# Patient Record
Sex: Female | Born: 1965 | Race: White | Hispanic: No | Marital: Married | Smoking: Never smoker
Health system: Southern US, Community
[De-identification: ages and names within clinical notes are randomized; demographics above are authoritative.]

## PROBLEM LIST (undated history)

## (undated) DIAGNOSIS — K760 Fatty (change of) liver, not elsewhere classified: Secondary | ICD-10-CM

## (undated) DIAGNOSIS — M549 Dorsalgia, unspecified: Secondary | ICD-10-CM

## (undated) DIAGNOSIS — R42 Dizziness and giddiness: Secondary | ICD-10-CM

## (undated) DIAGNOSIS — M2550 Pain in unspecified joint: Secondary | ICD-10-CM

## (undated) DIAGNOSIS — K219 Gastro-esophageal reflux disease without esophagitis: Secondary | ICD-10-CM

## (undated) DIAGNOSIS — G4700 Insomnia, unspecified: Secondary | ICD-10-CM

## (undated) DIAGNOSIS — K5792 Diverticulitis of intestine, part unspecified, without perforation or abscess without bleeding: Secondary | ICD-10-CM

## (undated) DIAGNOSIS — M1990 Unspecified osteoarthritis, unspecified site: Secondary | ICD-10-CM

## (undated) DIAGNOSIS — T7840XA Allergy, unspecified, initial encounter: Secondary | ICD-10-CM

## (undated) DIAGNOSIS — M47812 Spondylosis without myelopathy or radiculopathy, cervical region: Secondary | ICD-10-CM

## (undated) DIAGNOSIS — M069 Rheumatoid arthritis, unspecified: Secondary | ICD-10-CM

## (undated) DIAGNOSIS — D649 Anemia, unspecified: Secondary | ICD-10-CM

## (undated) HISTORY — DX: Dizziness and giddiness: R42

## (undated) HISTORY — PX: UPPER GI ENDOSCOPY: SHX6162

## (undated) HISTORY — DX: Gastro-esophageal reflux disease without esophagitis: K21.9

## (undated) HISTORY — DX: Unspecified osteoarthritis, unspecified site: M19.90

## (undated) HISTORY — DX: Fatty (change of) liver, not elsewhere classified: K76.0

## (undated) HISTORY — DX: Anemia, unspecified: D64.9

## (undated) HISTORY — DX: Diverticulitis of intestine, part unspecified, without perforation or abscess without bleeding: K57.92

## (undated) HISTORY — DX: Rheumatoid arthritis, unspecified: M06.9

## (undated) HISTORY — DX: Allergy, unspecified, initial encounter: T78.40XA

## (undated) HISTORY — DX: Dorsalgia, unspecified: M54.9

## (undated) HISTORY — DX: Pain in unspecified joint: M25.50

## (undated) HISTORY — PX: ANKLE SURGERY: SHX546

## (undated) HISTORY — PX: ABDOMINAL HYSTERECTOMY: SHX81

## (undated) HISTORY — DX: Spondylosis without myelopathy or radiculopathy, cervical region: M47.812

---

## 2013-07-17 ENCOUNTER — Other Ambulatory Visit (HOSPITAL_BASED_OUTPATIENT_CLINIC_OR_DEPARTMENT_OTHER): Payer: Self-pay | Admitting: Family Medicine

## 2013-07-17 DIAGNOSIS — Z1231 Encounter for screening mammogram for malignant neoplasm of breast: Secondary | ICD-10-CM

## 2015-07-07 DIAGNOSIS — M1712 Unilateral primary osteoarthritis, left knee: Secondary | ICD-10-CM | POA: Diagnosis not present

## 2015-07-07 DIAGNOSIS — M25562 Pain in left knee: Secondary | ICD-10-CM | POA: Diagnosis not present

## 2015-07-07 DIAGNOSIS — M25462 Effusion, left knee: Secondary | ICD-10-CM | POA: Diagnosis not present

## 2015-07-07 DIAGNOSIS — Z6838 Body mass index (BMI) 38.0-38.9, adult: Secondary | ICD-10-CM | POA: Diagnosis not present

## 2015-08-21 ENCOUNTER — Encounter: Payer: Self-pay | Admitting: Emergency Medicine

## 2015-08-21 ENCOUNTER — Emergency Department (INDEPENDENT_AMBULATORY_CARE_PROVIDER_SITE_OTHER)
Admission: EM | Admit: 2015-08-21 | Discharge: 2015-08-21 | Disposition: A | Discharge status: Home / Self Care | Payer: BLUE CROSS/BLUE SHIELD | Source: Home / Self Care | Attending: Family Medicine | Admitting: Family Medicine

## 2015-08-21 DIAGNOSIS — J040 Acute laryngitis: Secondary | ICD-10-CM | POA: Diagnosis not present

## 2015-08-21 DIAGNOSIS — J0100 Acute maxillary sinusitis, unspecified: Secondary | ICD-10-CM | POA: Diagnosis not present

## 2015-08-21 DIAGNOSIS — J209 Acute bronchitis, unspecified: Secondary | ICD-10-CM | POA: Diagnosis not present

## 2015-08-21 HISTORY — DX: Insomnia, unspecified: G47.00

## 2015-08-21 MED ORDER — DOXYCYCLINE HYCLATE 100 MG PO CAPS: 100.0000 mg | ORAL_CAPSULE | Freq: Two times a day (BID) | ORAL | Status: DC

## 2015-08-21 MED ORDER — PREDNISONE 20 MG PO TABS: ORAL_TABLET | ORAL | Status: DC

## 2015-08-21 MED ORDER — BENZONATATE 100 MG PO CAPS: 100.0000 mg | ORAL_CAPSULE | Freq: Three times a day (TID) | ORAL | Status: DC

## 2015-08-21 NOTE — ED Notes (Signed)
Patient reports onset of congestion about 2 weeks ago; then went on airplane trip and 4 days ago congestion, cough and ear pressure became more intense.

## 2015-08-21 NOTE — Discharge Instructions (Signed)
You may take 400-600mg  Ibuprofen (Motrin) every 6-8 hours for fever and pain  Alternate with Tylenol  You may take 500mg  Tylenol every 4-6 hours as needed for fever and pain  Follow-up with your primary care provider next week for recheck of symptoms if not improving.  Be sure to drink plenty of fluids and rest, at least 8hrs of sleep a night, preferably more while you are sick. Return urgent care or go to closest ER if you cannot keep down fluids/signs of dehydration, fever not reducing with Tylenol, difficulty breathing/wheezing, stiff neck, worsening condition, or other concerns (see below)  Please take antibiotics as prescribed and be sure to complete entire course even if you start to feel better to ensure infection does not come back.   Acute Bronchitis Bronchitis is when the airways that extend from the windpipe into the lungs get red, puffy, and painful (inflamed). Bronchitis often causes thick spit (mucus) to develop. This leads to a cough. A cough is the most common symptom of bronchitis. In acute bronchitis, the condition usually begins suddenly and goes away over time (usually in 2 weeks). Smoking, allergies, and asthma can make bronchitis worse. Repeated episodes of bronchitis may cause more lung problems. HOME CARE  Rest.  Drink enough fluids to keep your pee (urine) clear or pale yellow (unless you need to limit fluids as told by your doctor).  Only take over-the-counter or prescription medicines as told by your doctor.  Avoid smoking and secondhand smoke. These can make bronchitis worse. If you are a smoker, think about using nicotine gum or skin patches. Quitting smoking will help your lungs heal faster.  Reduce the chance of getting bronchitis again by:  Washing your hands often.  Avoiding people with cold symptoms.  Trying not to touch your hands to your mouth, nose, or eyes.  Follow up with your doctor as told. GET HELP IF: Your symptoms do not improve after 1 week  of treatment. Symptoms include:  Cough.  Fever.  Coughing up thick spit.  Body aches.  Chest congestion.  Chills.  Shortness of breath.  Sore throat. GET HELP RIGHT AWAY IF:   You have an increased fever.  You have chills.  You have severe shortness of breath.  You have bloody thick spit (sputum).  You throw up (vomit) often.  You lose too much body fluid (dehydration).  You have a severe headache.  You faint. MAKE SURE YOU:   Understand these instructions.  Will watch your condition.  Will get help right away if you are not doing well or get worse.   This information is not intended to replace advice given to you by your health care provider. Make sure you discuss any questions you have with your health care provider.   Document Released: 08/04/2007 Document Revised: 10/18/2012 Document Reviewed: 08/08/2012 Elsevier Interactive Patient Education 2016 [employer]  Laryngitis Laryngitis is swelling (inflammation) of your vocal cords. This causes hoarseness, coughing, loss of voice, sore throat, or a dry throat. When your vocal cords are inflamed, your voice sounds different. Laryngitis can be temporary (acute) or long-term (chronic). Most cases of acute laryngitis improve with time. Chronic laryngitis is laryngitis that lasts for more than three weeks. HOME CARE  Drink enough fluid to keep your pee (urine) clear or pale yellow.  Breathe in moist air. Use a humidifier if you live in a dry climate.  Take medicines only as told by your doctor.  Do not smoke cigarettes or electronic cigarettes. If  you need help quitting, ask your doctor.  Talk as little as possible. Also avoid whispering, which can cause vocal strain.  Write instead of talking. Do this until your voice is back to normal. GET HELP IF:  You have a fever.  Your pain is worse.  You have trouble swallowing. GET HELP RIGHT AWAY IF:  You cough up blood.  You have trouble breathing.     This information is not intended to replace advice given to you by your health care provider. Make sure you discuss any questions you have with your health care provider.   Document Released: 02/04/2011 Document Revised: 03/08/2014 Document Reviewed: 07/31/2013 Elsevier Interactive Patient Education [employer].

## 2015-08-21 NOTE — ED Provider Notes (Signed)
CSN: [id number]     Arrival date & time 08/21/15  1818 History   First MD Initiated Contact with Patient 08/21/15 1827     Chief Complaint  Patient presents with  . Nasal Congestion  . Cough  . Otalgia   (Consider location/radiation/quality/duration/timing/severity/associated sxs/prior Treatment) HPI Brittany Malone is a 50 y.o. female presenting to UC with c/o intermittent sinus congestion for about 2 weeks but was managing it with her Flonase.  She then went on an airplane about 4 days ago, symptoms have worsened ever since. She now has a moderately productive cough, hoarse voice, bilateral ear pressure and sinus pain.  Denies fever, chills, n/v/d. She has had prednisone in the past as well as has albuterol at home for when she gets sick but no official dx of asthma or COPD.  Pt reports anaphylaxis with PCN. She has had doxycycline in the past and has done well.   Past Medical History  Diagnosis Date  . Insomnia    Past Surgical History  Procedure Laterality Date  . Abdominal hysterectomy    . Ankle surgery     History reviewed. No pertinent family history. Social History  Substance Use Topics  . Smoking status: Never Smoker   . Smokeless tobacco: None  . Alcohol Use: No   OB History    No data available     Review of Systems  Constitutional: Negative for fever and chills.  HENT: Positive for congestion, ear pain, rhinorrhea, sinus pressure, sore throat and voice change. Negative for trouble swallowing.   Respiratory: Positive for cough. Negative for shortness of breath.   Cardiovascular: Negative for chest pain and palpitations.  Gastrointestinal: Negative for nausea, vomiting, abdominal pain and diarrhea.  Musculoskeletal: Negative for myalgias, back pain and arthralgias.  Skin: Negative for rash.  Neurological: Positive for headaches. Negative for dizziness and light-headedness.    Allergies  Penicillins  Home Medications   Prior to Admission medications    Medication Sig Start Date End Date Taking? Authorizing Provider  amitriptyline (ELAVIL) 25 MG tablet Take 25 mg by mouth at bedtime.   Yes Historical Provider, MD  budesonide-formoterol (SYMBICORT) 160-4.5 MCG/ACT inhaler Inhale 2 puffs into the lungs 2 (two) times daily.   Yes Historical Provider, MD  benzonatate (TESSALON) 100 MG capsule Take 1-2 capsules (100-200 mg total) by mouth every 8 (eight) hours. 08/21/15   Noland Fordyce, PA-C  doxycycline (VIBRAMYCIN) 100 MG capsule Take 1 capsule (100 mg total) by mouth 2 (two) times daily. One po bid x 7 days 08/21/15   Noland Fordyce, PA-C  predniSONE (DELTASONE) 20 MG tablet 3 tabs po day one, then 2 po daily x 4 days 08/21/15   Noland Fordyce, PA-C   Meds Ordered and Administered this Visit  Medications - No data to display  BP 115/84 mmHg  Pulse 85  Temp(Src) 98.1 F (36.7 C) (Oral)  Resp 16  Ht 5\' 8"  (1.727 m)  Wt 258 lb (117.028 kg)  BMI 39.24 kg/m2  SpO2 95% No data found.   Physical Exam  Constitutional: She appears well-developed and well-nourished. No distress.  HENT:  Head: Normocephalic and atraumatic.  Right Ear: Tympanic membrane normal.  Left Ear: Tympanic membrane normal.  Nose: Mucosal edema present. Right sinus exhibits maxillary sinus tenderness. Right sinus exhibits no frontal sinus tenderness. Left sinus exhibits maxillary sinus tenderness. Left sinus exhibits no frontal sinus tenderness.  Mouth/Throat: Uvula is midline, oropharynx is clear and moist and mucous membranes are normal.  Eyes: Conjunctivae are  normal. No scleral icterus.  Neck: Normal range of motion. Neck supple.  Hoarse voice, no stridor.  Cardiovascular: Normal rate, regular rhythm and normal heart sounds.   Pulmonary/Chest: Effort normal and breath sounds normal. No stridor. No respiratory distress. She has no wheezes. She has no rales.  Abdominal: Soft. She exhibits no distension. There is no tenderness.  Musculoskeletal: Normal range of motion.   Lymphadenopathy:    She has no cervical adenopathy.  Neurological: She is alert.  Skin: Skin is warm and dry. She is not diaphoretic.  Nursing note and vitals reviewed.   ED Course  Procedures (including critical care time)  Labs Review Labs Reviewed - No data to display  Imaging Review No results found.   MDM   1. Acute maxillary sinusitis, recurrence not specified   2. Acute bronchitis, unspecified organism   3. Laryngitis    Pt c/o worsening sinus congestion and pain, cough, and hoarse voice. Exam c/w acute bronchitis and sinusitis with laryngitis.   Rx: prednisone, doxycycline, and tessalon   Advised pt to use acetaminophen and ibuprofen as needed for fever and pain. Encouraged rest and fluids. F/u with PCP in 1 week if not improving, sooner if worsening. Pt verbalized understanding and agreement with tx plan.   Noland Fordyce, PA-C 08/21/15 [phone]

## 2015-08-28 ENCOUNTER — Telehealth: Payer: Self-pay | Admitting: *Deleted

## 2015-08-28 MED ORDER — DOXYCYCLINE HYCLATE 100 MG PO TABS: 100.0000 mg | ORAL_TABLET | Freq: Two times a day (BID) | ORAL | Status: DC

## 2015-08-28 NOTE — ED Notes (Signed)
Pt was treated for bronchitis and sinus infection by Junie Panning. She has 1 dose of doxy left and has finished the prednisone. She was feeling better up until yesterday, became more congested, cough up green sputum and more fatigue. She is inquiring about additional antibiotic. Please advise.   Dr. Assunta Found e-scribed additional doxy to [facility] on [address]. Patient notified.

## 2015-09-01 DIAGNOSIS — J9801 Acute bronchospasm: Secondary | ICD-10-CM | POA: Diagnosis not present

## 2015-09-01 DIAGNOSIS — J069 Acute upper respiratory infection, unspecified: Secondary | ICD-10-CM | POA: Diagnosis not present

## 2015-09-16 DIAGNOSIS — K5790 Diverticulosis of intestine, part unspecified, without perforation or abscess without bleeding: Secondary | ICD-10-CM | POA: Diagnosis not present

## 2015-09-16 DIAGNOSIS — G479 Sleep disorder, unspecified: Secondary | ICD-10-CM | POA: Diagnosis not present

## 2015-09-16 DIAGNOSIS — Z1239 Encounter for other screening for malignant neoplasm of breast: Secondary | ICD-10-CM | POA: Diagnosis not present

## 2015-09-16 DIAGNOSIS — R5383 Other fatigue: Secondary | ICD-10-CM | POA: Diagnosis not present

## 2015-09-16 DIAGNOSIS — M2550 Pain in unspecified joint: Secondary | ICD-10-CM | POA: Diagnosis not present

## 2015-09-16 DIAGNOSIS — Z0000 Encounter for general adult medical examination without abnormal findings: Secondary | ICD-10-CM | POA: Diagnosis not present

## 2015-10-20 DIAGNOSIS — M62838 Other muscle spasm: Secondary | ICD-10-CM | POA: Diagnosis not present

## 2016-03-09 DIAGNOSIS — Z713 Dietary counseling and surveillance: Secondary | ICD-10-CM | POA: Diagnosis not present

## 2016-09-17 DIAGNOSIS — K219 Gastro-esophageal reflux disease without esophagitis: Secondary | ICD-10-CM | POA: Diagnosis not present

## 2016-09-17 DIAGNOSIS — Z0000 Encounter for general adult medical examination without abnormal findings: Secondary | ICD-10-CM | POA: Diagnosis not present

## 2016-09-17 DIAGNOSIS — J309 Allergic rhinitis, unspecified: Secondary | ICD-10-CM | POA: Diagnosis not present

## 2016-09-17 DIAGNOSIS — K5790 Diverticulosis of intestine, part unspecified, without perforation or abscess without bleeding: Secondary | ICD-10-CM | POA: Diagnosis not present

## 2016-09-17 DIAGNOSIS — Z01419 Encounter for gynecological examination (general) (routine) without abnormal findings: Secondary | ICD-10-CM | POA: Diagnosis not present

## 2016-09-17 DIAGNOSIS — I10 Essential (primary) hypertension: Secondary | ICD-10-CM | POA: Diagnosis not present

## 2016-09-17 DIAGNOSIS — Z1231 Encounter for screening mammogram for malignant neoplasm of breast: Secondary | ICD-10-CM | POA: Diagnosis not present

## 2016-09-17 DIAGNOSIS — R768 Other specified abnormal immunological findings in serum: Secondary | ICD-10-CM | POA: Diagnosis not present

## 2016-09-28 DIAGNOSIS — Z1231 Encounter for screening mammogram for malignant neoplasm of breast: Secondary | ICD-10-CM | POA: Diagnosis not present

## 2016-09-28 DIAGNOSIS — Z139 Encounter for screening, unspecified: Secondary | ICD-10-CM | POA: Diagnosis not present

## 2017-01-30 ENCOUNTER — Other Ambulatory Visit: Payer: Self-pay

## 2017-01-30 ENCOUNTER — Emergency Department (INDEPENDENT_AMBULATORY_CARE_PROVIDER_SITE_OTHER)
Admission: EM | Admit: 2017-01-30 | Discharge: 2017-01-30 | Disposition: A | Discharge status: Home / Self Care | Payer: BLUE CROSS/BLUE SHIELD | Source: Home / Self Care | Attending: Family Medicine | Admitting: Family Medicine

## 2017-01-30 ENCOUNTER — Encounter: Payer: Self-pay | Admitting: Emergency Medicine

## 2017-01-30 DIAGNOSIS — J209 Acute bronchitis, unspecified: Secondary | ICD-10-CM

## 2017-01-30 DIAGNOSIS — J0100 Acute maxillary sinusitis, unspecified: Secondary | ICD-10-CM

## 2017-01-30 MED ORDER — ALBUTEROL SULFATE HFA 108 (90 BASE) MCG/ACT IN AERS: 1.0000 | INHALATION_SPRAY | Freq: Four times a day (QID) | RESPIRATORY_TRACT | 0 refills | Status: DC | PRN

## 2017-01-30 MED ORDER — BENZONATATE 100 MG PO CAPS: 100.0000 mg | ORAL_CAPSULE | Freq: Three times a day (TID) | ORAL | 0 refills | Status: DC

## 2017-01-30 MED ORDER — AZITHROMYCIN 250 MG PO TABS: 250.0000 mg | ORAL_TABLET | Freq: Every day | ORAL | 0 refills | Status: DC

## 2017-01-30 MED ORDER — PREDNISONE 20 MG PO TABS
ORAL_TABLET | ORAL | 0 refills | Status: DC
Start: 2017-01-30 — End: 2018-07-20

## 2017-01-30 NOTE — ED Triage Notes (Signed)
Has had congestion, cough and now rib pain for one week; just returned from [place] and shared cabin with daughter who had same symptoms. Took Ibuprofen 600mg  2 hours ago.

## 2017-01-30 NOTE — ED Provider Notes (Signed)
[facility]    CSN: [id number] Arrival date & time: 01/30/17  1708     History   Chief Complaint Chief Complaint  Patient presents with  . Cough  . Nasal Congestion    HPI Brittany Malone is a 51 y.o. female.   HPI  Brittany Malone is a 51 y.o. female presenting to UC with c/o 1 week of worsening productive cough that has developed into Left sided chest/rib and back pain from the cough.  She has been taking mucinex and ibuprofen with mild relief associated sinus pain and congestion.  She recently returned from a 1 week long [place] cruise and shared a cabin with her daughter who was seen earlier today at this UC for similar symptoms that had been ongoing for 3 weeks.  Pt reports hx of bronchitis in the past.  Denies fever, chills, n/v/d.    Past Medical History:  Diagnosis Date  . Insomnia     There are no active problems to display for this patient.   Past Surgical History:  Procedure Laterality Date  . ABDOMINAL HYSTERECTOMY    . ANKLE SURGERY      OB History    No data available       Home Medications    Prior to Admission medications   Medication Sig Start Date End Date Taking? Authorizing Provider  albuterol (PROVENTIL HFA;VENTOLIN HFA) 108 (90 Base) MCG/ACT inhaler Inhale 1-2 puffs into the lungs every 6 (six) hours as needed for wheezing or shortness of breath. 01/30/17   Noe Gens, PA-C  amitriptyline (ELAVIL) 25 MG tablet Take 25 mg by mouth at bedtime.    [provider]  azithromycin (ZITHROMAX) 250 MG tablet Take 1 tablet (250 mg total) by mouth daily. Take first 2 tablets together, then 1 every day until finished. 01/30/17   Noe Gens, PA-C  benzonatate (TESSALON) 100 MG capsule Take 1-2 capsules (100-200 mg total) by mouth every 8 (eight) hours. 01/30/17   Noe Gens, PA-C  budesonide-formoterol (SYMBICORT) 160-4.5 MCG/ACT inhaler Inhale 2 puffs into the lungs 2 (two) times daily.    [provider]    doxycycline (VIBRA-TABS) 100 MG tablet Take 1 tablet (100 mg total) by mouth 2 (two) times daily. Take with food. 08/28/15   Kandra Nicolas, MD  doxycycline (VIBRAMYCIN) 100 MG capsule Take 1 capsule (100 mg total) by mouth 2 (two) times daily. One po bid x 7 days 08/21/15   Noe Gens, PA-C  predniSONE (DELTASONE) 20 MG tablet 3 tabs po day one, then 2 po daily x 4 days 01/30/17   Tyrell Antonio    Family History History reviewed. No pertinent family history.  Social History Social History   Tobacco Use  . Smoking status: Never Smoker  . Smokeless tobacco: Never Used  Substance Use Topics  . Alcohol use: No  . Drug use: Not on file     Allergies   Penicillins   Review of Systems Review of Systems  Constitutional: Negative for chills and fever.  HENT: Positive for congestion, postnasal drip and rhinorrhea. Negative for ear pain, sore throat, trouble swallowing and voice change.   Respiratory: Positive for cough, chest tightness and wheezing. Negative for shortness of breath.   Cardiovascular: Positive for chest pain (Left side/ribs with cough). Negative for palpitations.  Gastrointestinal: Negative for abdominal pain, diarrhea, nausea and vomiting.  Musculoskeletal: Positive for back pain and myalgias. Negative for arthralgias.  Skin: Negative for rash.  Physical Exam Triage Vital Signs ED Triage Vitals [01/30/17 1721]  Enc Vitals Group     BP 118/77     Pulse Rate 77     Resp 16     Temp 98.3 F (36.8 C)     Temp Source Oral     SpO2 96 %     Weight 230 lb (104.3 kg)     Height 5\' 8"  (1.727 m)     Head Circumference      Peak Flow      Pain Score      Pain Loc      Pain Edu?      Excl. in [place]?    No data found.  Updated Vital Signs BP 118/77 (BP Location: Right Arm)   Pulse 77   Temp 98.3 F (36.8 C) (Oral)   Resp 16   Ht 5\' 8"  (1.727 m)   Wt 230 lb (104.3 kg)   SpO2 96%   BMI 34.97 kg/m   Visual Acuity Right Eye Distance:   Left  Eye Distance:   Bilateral Distance:    Right Eye Near:   Left Eye Near:    Bilateral Near:     Physical Exam  Constitutional: She is oriented to person, place, and time. She appears well-developed and well-nourished. No distress.  HENT:  Head: Normocephalic and atraumatic.  Right Ear: Tympanic membrane normal.  Left Ear: Tympanic membrane normal.  Nose: Mucosal edema present. Right sinus exhibits maxillary sinus tenderness. Right sinus exhibits no frontal sinus tenderness. Left sinus exhibits maxillary sinus tenderness. Left sinus exhibits no frontal sinus tenderness.  Mouth/Throat: Uvula is midline, oropharynx is clear and moist and mucous membranes are normal.  Eyes: EOM are normal.  Neck: Normal range of motion.  Cardiovascular: Normal rate.  Pulmonary/Chest: Effort normal. She has wheezes. She has rhonchi. She has no rales.     She exhibits tenderness.    Musculoskeletal: Normal range of motion.  Neurological: She is alert and oriented to person, place, and time.  Skin: Skin is warm and dry. She is not diaphoretic.  Psychiatric: She has a normal mood and affect. Her behavior is normal.  Nursing note and vitals reviewed.    UC Treatments / Results  Labs (all labs ordered are listed, but only abnormal results are displayed) Labs Reviewed - No data to display  EKG  EKG Interpretation None       Radiology No results found.  Procedures Procedures (including critical care time)  Medications Ordered in UC Medications - No data to display   Initial Impression / Assessment and Plan / UC Course  I have reviewed the triage vital signs and the nursing notes.  Pertinent labs & imaging results that were available during my care of the patient were reviewed by me and considered in my medical decision making (see chart for details).     Hx and exam c/w bronchitis and maxillary sinusitis Will tx with Azithromycin as pt reports anaphylaxis with PCN Encouraged f/u  with PCP in 1 week if not improving.   Final Clinical Impressions(s) / UC Diagnoses   Final diagnoses:  Acute bronchitis, unspecified organism  Acute non-recurrent maxillary sinusitis    ED Discharge Orders        Ordered    benzonatate (TESSALON) 100 MG capsule  Every 8 hours     01/30/17 1741    azithromycin (ZITHROMAX) 250 MG tablet  Daily     01/30/17 1741    predniSONE (DELTASONE)  20 MG tablet     01/30/17 1741    albuterol (PROVENTIL HFA;VENTOLIN HFA) 108 (90 Base) MCG/ACT inhaler  Every 6 hours PRN     01/30/17 1741       Controlled Substance Prescriptions [place] Controlled Substance Registry consulted? Not Applicable   Tyrell Antonio 01/30/17 1810

## 2017-03-22 DIAGNOSIS — M7581 Other shoulder lesions, right shoulder: Secondary | ICD-10-CM | POA: Diagnosis not present

## 2017-09-21 DIAGNOSIS — Z0000 Encounter for general adult medical examination without abnormal findings: Secondary | ICD-10-CM | POA: Diagnosis not present

## 2017-09-21 DIAGNOSIS — M7662 Achilles tendinitis, left leg: Secondary | ICD-10-CM | POA: Diagnosis not present

## 2017-09-21 DIAGNOSIS — N951 Menopausal and female climacteric states: Secondary | ICD-10-CM | POA: Diagnosis not present

## 2017-09-21 DIAGNOSIS — Z1231 Encounter for screening mammogram for malignant neoplasm of breast: Secondary | ICD-10-CM | POA: Diagnosis not present

## 2017-09-21 DIAGNOSIS — R768 Other specified abnormal immunological findings in serum: Secondary | ICD-10-CM | POA: Diagnosis not present

## 2017-09-21 DIAGNOSIS — K219 Gastro-esophageal reflux disease without esophagitis: Secondary | ICD-10-CM | POA: Diagnosis not present

## 2017-11-02 DIAGNOSIS — Z1231 Encounter for screening mammogram for malignant neoplasm of breast: Secondary | ICD-10-CM | POA: Diagnosis not present

## 2017-11-10 DIAGNOSIS — J9801 Acute bronchospasm: Secondary | ICD-10-CM | POA: Diagnosis not present

## 2017-11-10 DIAGNOSIS — J069 Acute upper respiratory infection, unspecified: Secondary | ICD-10-CM | POA: Diagnosis not present

## 2017-12-07 DIAGNOSIS — M7732 Calcaneal spur, left foot: Secondary | ICD-10-CM | POA: Diagnosis not present

## 2017-12-07 DIAGNOSIS — M7752 Other enthesopathy of left foot: Secondary | ICD-10-CM | POA: Diagnosis not present

## 2017-12-07 DIAGNOSIS — M79672 Pain in left foot: Secondary | ICD-10-CM | POA: Diagnosis not present

## 2018-05-09 DIAGNOSIS — M7662 Achilles tendinitis, left leg: Secondary | ICD-10-CM | POA: Diagnosis not present

## 2018-05-09 DIAGNOSIS — M7752 Other enthesopathy of left foot: Secondary | ICD-10-CM | POA: Diagnosis not present

## 2018-05-09 DIAGNOSIS — M7741 Metatarsalgia, right foot: Secondary | ICD-10-CM | POA: Diagnosis not present

## 2018-07-14 NOTE — Progress Notes (Signed)
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  Subjective:  Pain in multiple joints and positive rheumatoid factor.   History of Present Illness: Brittany Malone is a 53 y.o. female seen in consultation per request of her PCP.  According to patient her symptoms started few years ago with aches and pains.  She states she had rheumatoid factor done by PCP which was about 18.  Repeat rheumatoid factor last year was 24.  She states the pain has gotten worse in the last 5 years.  She describes pain in her lower back in her SI joints left trochanteric area and in her hands.  She states she was also having discomfort in her right foot for which she was seen by podiatrist and the x-rays were normal.  She was given some orthotics.  She had surgery on her left foot in the past for a tendinopathy.  She has been experiencing pain in her elbow joints and TMJs.  She also has frequent headaches due to stress and sinuses.  She gives history of fatigue.  She does exercise on regular basis which is been helpful.  She states while she was in fourth grade she developed loss of central vision.  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  In her 39s she had recurrent problems with her right eye uveitis for couple of years and then the symptoms resolved.  She states she still sees retinal specialist on a regular basis.  She has had no recurrence of uveitis in the last 25 years.  Activities of Daily Living:  Patient reports morning stiffness for 1 hour.   Patient Reports nocturnal pain.  Difficulty dressing/grooming: Denies Difficulty climbing stairs: Denies Difficulty getting out of chair: Denies Difficulty using hands for taps, buttons, cutlery, and/or writing: Denies  Review of Systems  Constitutional:  Positive for fatigue. Negative for night sweats, weight gain and weight loss.  HENT: Negative for mouth sores, trouble swallowing, trouble swallowing, mouth dryness and nose dryness.   Eyes: Negative for pain, redness, visual disturbance and dryness.  Respiratory: Negative for cough, shortness of breath and difficulty breathing.   Cardiovascular: Negative for chest pain, palpitations, hypertension, irregular heartbeat and swelling in legs/feet.  Gastrointestinal: Negative for blood in stool, constipation and diarrhea.  Endocrine: Negative for increased urination.  Genitourinary: Negative for vaginal dryness.  Musculoskeletal: Positive for arthralgias, joint pain, myalgias, morning stiffness and myalgias. Negative for joint swelling, muscle weakness and muscle tenderness.  Skin: Negative for color change, rash, hair loss, skin tightness, ulcers and sensitivity to sunlight.  Allergic/Immunologic: Negative for susceptible to infections.  Neurological: Positive for headaches. Negative for dizziness, memory loss, night sweats and weakness.  Hematological: Negative for swollen glands.  Psychiatric/Behavioral: Positive for sleep disturbance. Negative for depressed mood. The patient is nervous/anxious.     PMFS History:  There are no active problems to display for this patient.   Past Medical History:  Diagnosis Date  . Insomnia     Family History  Problem Relation Age of Onset  . Lupus Mother   . Fibromyalgia Mother   . Hypertension Father   . High Cholesterol Father   .  Kidney Stones Brother   . Healthy Son   . Hypertension Daughter   . Migraines Daughter    Past Surgical History:  Procedure Laterality Date  . ABDOMINAL HYSTERECTOMY    . ANKLE SURGERY     Social History   Social History Narrative  . Not on file    There is no immunization history on file for this patient.   Objective: Vital Signs: BP (!) 144/104 (BP Location: Right Wrist, Patient Position: Sitting, Cuff  Size: Normal)   Pulse 80   Resp 15   Ht 5' 7.75" (1.721 m)   Wt 260 lb (117.9 kg)   BMI 39.83 kg/m    Physical Exam Vitals signs and nursing note reviewed.  Constitutional:      Appearance: She is well-developed.  HENT:     Head: Normocephalic and atraumatic.  Eyes:     Conjunctiva/sclera: Conjunctivae normal.  Neck:     Musculoskeletal: Normal range of motion.  Cardiovascular:     Rate and Rhythm: Normal rate and regular rhythm.     Heart sounds: Normal heart sounds.  Pulmonary:     Effort: Pulmonary effort is normal.     Breath sounds: Normal breath sounds.  Abdominal:     General: Bowel sounds are normal.     Palpations: Abdomen is soft.  Lymphadenopathy:     Cervical: No cervical adenopathy.  Skin:    General: Skin is warm and dry.     Capillary Refill: Capillary refill takes less than 2 seconds.  Neurological:     Mental Status: She is alert and oriented to person, place, and time.  Psychiatric:        Behavior: Behavior normal.      Musculoskeletal Exam: C-spine thoracic and lumbar spine good range of motion.  She had some discomfort range of motion of her lumbar spine.  She had tenderness on palpation over bilateral SI joints.  Shoulder joints, elbow joints, wrist joints, MCPs PIPs and DIPs with good range of motion with no synovitis.  Hip joints, knee joints, ankles, MTPs PIPs DIPs with good range of motion with no synovitis.  She has bilateral pes cavus.  CDAI Exam: CDAI Score: Not documented Patient Global Assessment: Not documented; Provider Global Assessment: Not documented Swollen: Not documented; Tender: Not documented Joint Exam   Not documented   There is currently no information documented on the homunculus. Go to the Rheumatology activity and complete the homunculus joint exam.  Investigation: No additional findings.  Imaging: Xr Hand 2 View Left  Result Date: 07/20/2018 Minimal PIP narrowing was noted.  No MCP intercarpal radiocarpal joint  space narrowing was noted.  No erosive changes were noted. 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  Xr Hand 2 View Right  Result Date: 07/20/2018 Minimal PIP narrowing was noted.  No MCP intercarpal radiocarpal joint space narrowing was noted.  No erosive changes were noted. 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  Xr Lumbar Spine 2-3 Views  Result Date: 07/20/2018 Mild levoscoliosis was noted.  Multilevel spondylosis was noted.  Significant narrowing was noted between T12-L1 and L1-L2.  Facet joint arthropathy was noted. Impression: These findings are consistent with mild scoliosis, multilevel spondylosis and facet joint arthropathy.  Xr Pelvis 1-2 Views  Result Date: 07/20/2018 No SI joint narrowing or sclerosis was noted. Impression: Unremarkable x-ray of the SI joints.   Recent Labs: No results found for: WBC, HGB, PLT, NA, K, CL, CO2,  GLUCOSE, BUN, CREATININE, BILITOT, ALKPHOS, AST, ALT, PROT, ALBUMIN, CALCIUM, GFRAA, QFTBGOLD, QFTBGOLDPLUS  Speciality Comments: No specialty comments available.  Procedures:  No procedures performed Allergies: Penicillins   Assessment / Plan:     Visit Diagnoses: Rheumatoid factor positive-patient gives history of rheumatoid factor low titer positive.  She had no synovitis on examination today.  Although she has multiple arthralgias.  History of autoimmune disease in her mother who is currently on Plaquenil for inflammatory arthritis and positive ANA.  Pain in both hands -no synovitis is noted on examination of her hands.  She gives history of significant stiffness and discomfort in her hands.  Plan: XR Hand 2 View Right, XR Hand 2 View Left, x-ray of the hand showed mild osteoarthritis only.  Sedimentation rate, Uric acid, Rheumatoid factor, Cyclic citrul peptide antibody, IgG, 14-3-3 eta Protein, ANA  Chronic SI joint pain -she had tenderness on palpation of bilateral  SI joints.  Plan: XR Pelvis 1-2 Views, no SI joint sclerosis or narrowing was noted.  HLA-B27 antigen  Chronic midline low back pain without sciatica -she gives history of chronic lower back pain.  Plan: XR Lumbar Spine 2-3 Views.  Mild levoscoliosis, mild spondylosis and facet joint arthropathy was noted.  A handout on back exercises was given.  Pes cavus - Bilateral.  She has pain in her bilateral feet.  She states x-rays done recently by podiatrist were normal.  She was given orthotics.  No synovitis was noted on examination.  Chronic uveitis of both eyes-patient reports having an episode of uveitis at age 55 and then recurrent uveitis for couple of years in her 1s but no recurrence since then.  She has been followed by retina specialist.  Other fatigue - Plan: CBC with Differential/Platelet, COMPLETE METABOLIC PANEL WITH GFR, CK, TSH, Glucose 6 phosphate dehydrogenase  History of diverticulosis  History of gastroesophageal reflux (GERD)  History of melanoma -patient states that she had 3 melanocytic lesion but it was not melanoma.  She was seen by [place] [facility].  She will bring those records for [place] to confirm the diagnosis.  Orders: Orders Placed This Encounter  Procedures  . XR Pelvis 1-2 Views  . XR Lumbar Spine 2-3 Views  . XR Hand 2 View Right  . XR Hand 2 View Left  . CBC with Differential/Platelet  . COMPLETE METABOLIC PANEL WITH GFR  . Sedimentation rate  . CK  . TSH  . Uric acid  . Rheumatoid factor  . Cyclic citrul peptide antibody, IgG  . 14-3-3 eta Protein  . ANA  . HLA-B27 antigen  . Glucose 6 phosphate dehydrogenase   No orders of the defined types were placed in this encounter.   Face-to-face time spent with patient was 60 minutes. Greater than 50% of time was spent in counseling and coordination of care.  Follow-Up Instructions: Return for Polyarthralgia and positive rheumatoid factor.   Bo Merino, MD  Note - This record has been created  using Editor, commissioning.  Chart creation errors have been sought, but may not always  have been located. Such creation errors do not reflect on  the standard of medical care.

## 2018-07-20 ENCOUNTER — Ambulatory Visit: Payer: Self-pay

## 2018-07-20 ENCOUNTER — Ambulatory Visit (INDEPENDENT_AMBULATORY_CARE_PROVIDER_SITE_OTHER): Payer: BLUE CROSS/BLUE SHIELD

## 2018-07-20 ENCOUNTER — Other Ambulatory Visit: Payer: Self-pay

## 2018-07-20 ENCOUNTER — Encounter: Payer: Self-pay | Admitting: Rheumatology

## 2018-07-20 ENCOUNTER — Ambulatory Visit (INDEPENDENT_AMBULATORY_CARE_PROVIDER_SITE_OTHER): Payer: BLUE CROSS/BLUE SHIELD | Admitting: Rheumatology

## 2018-07-20 VITALS — BP 144/104 | HR 80 | Resp 15 | Ht 67.75 in | Wt 260.0 lb

## 2018-07-20 DIAGNOSIS — M79642 Pain in left hand: Secondary | ICD-10-CM

## 2018-07-20 DIAGNOSIS — Q667 Congenital pes cavus, unspecified foot: Secondary | ICD-10-CM

## 2018-07-20 DIAGNOSIS — G8929 Other chronic pain: Secondary | ICD-10-CM | POA: Diagnosis not present

## 2018-07-20 DIAGNOSIS — M79641 Pain in right hand: Secondary | ICD-10-CM | POA: Diagnosis not present

## 2018-07-20 DIAGNOSIS — M533 Sacrococcygeal disorders, not elsewhere classified: Secondary | ICD-10-CM

## 2018-07-20 DIAGNOSIS — Z8719 Personal history of other diseases of the digestive system: Secondary | ICD-10-CM

## 2018-07-20 DIAGNOSIS — H2013 Chronic iridocyclitis, bilateral: Secondary | ICD-10-CM

## 2018-07-20 DIAGNOSIS — R5383 Other fatigue: Secondary | ICD-10-CM | POA: Diagnosis not present

## 2018-07-20 DIAGNOSIS — R768 Other specified abnormal immunological findings in serum: Secondary | ICD-10-CM | POA: Diagnosis not present

## 2018-07-20 DIAGNOSIS — Z85820 Personal history of malignant melanoma of skin: Secondary | ICD-10-CM

## 2018-07-20 DIAGNOSIS — M545 Low back pain: Secondary | ICD-10-CM

## 2018-07-20 NOTE — Patient Instructions (Signed)

## 2018-07-25 NOTE — Progress Notes (Signed)
Labs are consistent with rheumatoid arthritis.  We will discuss treatment plan at the follow-up visit.

## 2018-07-26 ENCOUNTER — Telehealth: Payer: Self-pay | Admitting: Rheumatology

## 2018-07-26 LAB — COMPLETE METABOLIC PANEL WITH GFR
AG Ratio: 1.5 (calc) (ref 1.0–2.5)
ALT: 29 U/L (ref 6–29)
AST: 21 U/L (ref 10–35)
Albumin: 4 g/dL (ref 3.6–5.1)
Alkaline phosphatase (APISO): 79 U/L (ref 37–153)
BUN: 15 mg/dL (ref 7–25)
CO2: 29 mmol/L (ref 20–32)
Calcium: 9.2 mg/dL (ref 8.6–10.4)
Chloride: 108 mmol/L (ref 98–110)
Creat: 0.72 mg/dL (ref 0.50–1.05)
GFR, Est African American: 110 mL/min/{1.73_m2} (ref >=60)
GFR, Est Non African American: 95 mL/min/{1.73_m2} (ref >=60)
Globulin: 2.6 g/dL (calc) (ref 1.9–3.7)
Glucose, Bld: 100 mg/dL — ABNORMAL HIGH (ref 65–99)
Potassium: 4.6 mmol/L (ref 3.5–5.3)
Sodium: 141 mmol/L (ref 135–146)
Total Bilirubin: 0.3 mg/dL (ref 0.2–1.2)
Total Protein: 6.6 g/dL (ref 6.1–8.1)

## 2018-07-26 LAB — CBC WITH DIFFERENTIAL/PLATELET
Absolute Monocytes: 454 cells/uL (ref 200–950)
Basophils Absolute: 39 cells/uL (ref 0–200)
Basophils Relative: 0.7 %
Eosinophils Absolute: 151 cells/uL (ref 15–500)
Eosinophils Relative: 2.7 %
HCT: 41.5 % (ref 35.0–45.0)
Hemoglobin: 13.9 g/dL (ref 11.7–15.5)
Lymphs Abs: 1417 cells/uL (ref 850–3900)
MCH: 28.9 pg (ref 27.0–33.0)
MCHC: 33.5 g/dL (ref 32.0–36.0)
MCV: 86.3 fL (ref 80.0–100.0)
MPV: 9.8 fL (ref 7.5–12.5)
Monocytes Relative: 8.1 %
Neutro Abs: 3539 cells/uL (ref 1500–7800)
Neutrophils Relative %: 63.2 %
Platelets: 255 10*3/uL (ref 140–400)
RBC: 4.81 10*6/uL (ref 3.80–5.10)
RDW: 13.5 % (ref 11.0–15.0)
Total Lymphocyte: 25.3 %
WBC: 5.6 10*3/uL (ref 3.8–10.8)

## 2018-07-26 LAB — HLA-B27 ANTIGEN: HLA-B27 Antigen: NEGATIVE

## 2018-07-26 LAB — GLUCOSE 6 PHOSPHATE DEHYDROGENASE: G-6PDH: 15.7 U/g Hgb (ref 7.0–20.5)

## 2018-07-26 LAB — TSH: TSH: 1.24 mIU/L

## 2018-07-26 LAB — CYCLIC CITRUL PEPTIDE ANTIBODY, IGG: Cyclic Citrullin Peptide Ab: >250 UNITS — ABNORMAL HIGH

## 2018-07-26 LAB — 14-3-3 ETA PROTEIN: 14-3-3 eta Protein: <0.2 ng/mL (ref <0.2)

## 2018-07-26 LAB — ANA: Anti Nuclear Antibody (ANA): NEGATIVE

## 2018-07-26 LAB — SEDIMENTATION RATE: Sed Rate: 14 mm/h (ref 0–30)

## 2018-07-26 LAB — URIC ACID: Uric Acid, Serum: 6.1 mg/dL (ref 2.5–7.0)

## 2018-07-26 LAB — CK: Total CK: 64 U/L (ref 29–143)

## 2018-07-26 LAB — RHEUMATOID FACTOR: Rheumatoid fact SerPl-aCnc: 24 IU/mL — ABNORMAL HIGH (ref <14)

## 2018-07-26 NOTE — Telephone Encounter (Signed)
Hinton Dyer from [facility] called stating they received records request for biopsy removal for the patient.  Hinton Dyer states patient had a benign mole removed 7 years ago.  Hinton Dyer states the patient has not been in the office in over 5 years and will have to get records from [facility].  If you have any additional questions, please call back [phone]

## 2018-08-04 NOTE — Progress Notes (Signed)
Office Visit Note  Patient: Brittany Malone             Date of Birth: 01/11/1966           MRN: [id number]             PCP: Amador Cunas, FNP Referring: Amador Cunas, FNP Visit Date: 08/15/2018 Occupation: _0 @  Subjective:  Pain in both hands .    History of Present Illness: Brittany Malone is a 53 y.o. female with seropositive rheumatoid arthritis.  She continues to have pain and discomfort in her bilateral hands and her right foot.  She has some discomfort in her lower back.  She denies any joint swelling currently.  She has morning stiffness that is lasting for about 1 hour.  Activities of Daily Living:  Patient reports morning stiffness for 1 hour.   Patient Denies nocturnal pain.  Difficulty dressing/grooming: Denies Difficulty climbing stairs: Denies Difficulty getting out of chair: Denies Difficulty using hands for taps, buttons, cutlery, and/or writing: Denies  Review of Systems  Constitutional: Positive for fatigue. Negative for night sweats, weight gain and weight loss.  HENT: Negative for mouth sores, trouble swallowing, trouble swallowing, mouth dryness and nose dryness.   Eyes: Negative for pain, redness, visual disturbance and dryness.  Respiratory: Negative for cough, shortness of breath and difficulty breathing.   Cardiovascular: Negative for chest pain, palpitations, hypertension, irregular heartbeat and swelling in legs/feet.  Gastrointestinal: Negative for blood in stool, constipation and diarrhea.  Endocrine: Negative for increased urination.  Genitourinary: Negative for painful urination and vaginal dryness.  Musculoskeletal: Positive for arthralgias, joint pain, morning stiffness and muscle tenderness. Negative for joint swelling, myalgias, muscle weakness and myalgias.  Skin: Negative for color change, rash, hair loss, skin tightness, ulcers and sensitivity to sunlight.  Allergic/Immunologic: Negative for susceptible to infections.  Neurological:  Negative for dizziness, memory loss, night sweats and weakness.  Hematological: Negative for bruising/bleeding tendency and swollen glands.  Psychiatric/Behavioral: Positive for sleep disturbance. Negative for depressed mood. The patient is not nervous/anxious.     PMFS History:  Patient Active Problem List   Diagnosis Date Noted  . Rheumatoid arthritis involving multiple sites with positive rheumatoid factor ([place]) 08/15/2018  . Family history of rheumatoid arthritis 08/15/2018  . Chronic uveitis of both eyes 08/15/2018  . Pes cavus 08/15/2018  . History of gastroesophageal reflux (GERD) 08/15/2018    Past Medical History:  Diagnosis Date  . Insomnia     Family History  Problem Relation Age of Onset  . Lupus Mother   . Fibromyalgia Mother   . Hypertension Father   . High Cholesterol Father   . Kidney Stones Brother   . Healthy Son   . Hypertension Daughter   . Migraines Daughter    Past Surgical History:  Procedure Laterality Date  . ABDOMINAL HYSTERECTOMY    . ANKLE SURGERY     Social History   Social History Narrative  . Not on file    There is no immunization history on file for this patient.   Objective: Vital Signs: BP 136/87 (BP Location: Left Arm, Patient Position: Sitting, Cuff Size: Normal)   Pulse 87   Resp 16   Ht 5' 7.75" (1.721 m)   Wt 258 lb 6.4 oz (117.2 kg)   BMI 39.58 kg/m    Physical Exam Vitals signs and nursing note reviewed.  Constitutional:      Appearance: She is well-developed.  HENT:     Head: Normocephalic and atraumatic.  Eyes:     Conjunctiva/sclera: Conjunctivae normal.  Neck:     Musculoskeletal: Normal range of motion.  Cardiovascular:     Rate and Rhythm: Normal rate and regular rhythm.     Heart sounds: Normal heart sounds.  Pulmonary:     Effort: Pulmonary effort is normal.     Breath sounds: Normal breath sounds.  Abdominal:     General: Bowel sounds are normal.     Palpations: Abdomen is soft.  Lymphadenopathy:      Cervical: No cervical adenopathy.  Skin:    General: Skin is warm and dry.     Capillary Refill: Capillary refill takes less than 2 seconds.  Neurological:     Mental Status: She is alert and oriented to person, place, and time.  Psychiatric:        Behavior: Behavior normal.      Musculoskeletal Exam: C-spine thoracic and lumbar spine were in good range of motion.  Shoulder joints, elbow joints, wrist joints with good range of motion with no synovitis.  She has tenderness on palpation over some of the MCPs and PIPs as described below.  She had good range of motion of her hip joints and knee joints.  Left second and third MTPs were tender and mildly swollen.  She had bilateral pes cavus.  CDAI Exam: CDAI Score: 4.8  Patient Global: 4 mm; Provider Global: 4 mm Swollen: 2 ; Tender: 6  Joint Exam      Right  Left  MCP 4   Tender   Tender  MCP 5      Tender  IP   Tender     MTP 2     Swollen Tender  MTP 3     Swollen Tender     Investigation: No additional findings.  Imaging: Xr Hand 2 View Left  Result Date: 07/20/2018 Minimal PIP narrowing was noted.  No MCP intercarpal radiocarpal joint space narrowing was noted.  No erosive changes were noted. 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  Xr Hand 2 View Right  Result Date: 07/20/2018 Minimal PIP narrowing was noted.  No MCP intercarpal radiocarpal joint space narrowing was noted.  No erosive changes were noted. 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  Xr Lumbar Spine 2-3 Views  Result Date: 07/20/2018 Mild levoscoliosis was noted.  Multilevel spondylosis was noted.  Significant narrowing was noted between T12-L1 and L1-L2.  Facet joint arthropathy was noted. Impression: These findings are consistent with mild scoliosis, multilevel spondylosis and facet joint arthropathy.  Xr Pelvis 1-2 Views  Result Date: 07/20/2018 No SI joint narrowing or  sclerosis was noted. Impression: Unremarkable x-ray of the SI joints.   Recent Labs: Lab Results  Component Value Date   WBC 5.6 07/20/2018   HGB 13.9 07/20/2018   PLT 255 07/20/2018   NA 141 07/20/2018   K 4.6 07/20/2018   CL 108 07/20/2018   CO2 29 07/20/2018   GLUCOSE 100 (H) 07/20/2018   BUN 15 07/20/2018   CREATININE 0.72 07/20/2018   BILITOT 0.3 07/20/2018   AST 21 07/20/2018   ALT 29 07/20/2018   PROT 6.6 07/20/2018   CALCIUM 9.2 07/20/2018   GFRAA 110 07/20/2018  4, TSH normal, G6PD normal, uric acid 6.1, RF 24, anti-CCP> 250, _0 eta negative, ANA negative, HLA-B27 negative  Speciality Comments: No specialty comments available.  Procedures:  No procedures performed Allergies: Penicillins   Assessment / Plan:  Visit Diagnoses: Rheumatoid arthritis involving multiple sites with positive rheumatoid factor (HCC) - Polyarthralgia, positive RF, positive anti-CCP -patient has pain and discomfort in multiple joints with mild swelling.  We had detailed discussion regarding rheumatoid arthritis and the treatment options.  After discussing indications side effects contraindications she was in agreement to proceed with Plaquenil.  The plan is to start her on Plaquenil 200 mg p.o. twice daily.  We will check labs in a month then every 3 months.  If labs are stable will change them to every 5 months.  She will need baseline examination and then yearly eye examination.  Record immunization was also discussed.    High risk medication use - Plan Plaquenil 200 mg p.o. twice daily.  Family history of systemic lupus in mother  Pes cavus - Plan: Proper fitting shoes with arch supports were discussed.  Chronic uveitis of both eyes - From the age of 2 years until her 69s -she has had no recurrence.  History of gastroesophageal reflux (GERD)  History of diverticulosis  Orders: Orders Placed This Encounter  Procedures  . CBC with Differential/Platelet  . COMPLETE METABOLIC  PANEL WITH GFR   Meds ordered this encounter  Medications  . hydroxychloroquine (PLAQUENIL) 200 MG tablet    Sig: Take 1 tablet (200 mg total) by mouth 2 (two) times daily.    Dispense:  180 tablet    Refill:  0      Follow-Up Instructions: Return in about 3 months (around 11/15/2018) for Rheumatoid arthritis.   Bo Merino, MD  Note - This record has been created using Editor, commissioning.  Chart creation errors have been sought, but may not always  have been located. Such creation errors do not reflect on  the standard of medical care.

## 2018-08-15 ENCOUNTER — Other Ambulatory Visit: Payer: Self-pay

## 2018-08-15 ENCOUNTER — Ambulatory Visit (INDEPENDENT_AMBULATORY_CARE_PROVIDER_SITE_OTHER): Payer: BC Managed Care – PPO | Admitting: Rheumatology

## 2018-08-15 ENCOUNTER — Encounter: Payer: Self-pay | Admitting: Rheumatology

## 2018-08-15 VITALS — BP 136/87 | HR 87 | Resp 16 | Ht 67.75 in | Wt 258.4 lb

## 2018-08-15 DIAGNOSIS — H2013 Chronic iridocyclitis, bilateral: Secondary | ICD-10-CM | POA: Insufficient documentation

## 2018-08-15 DIAGNOSIS — Z8269 Family history of other diseases of the musculoskeletal system and connective tissue: Secondary | ICD-10-CM

## 2018-08-15 DIAGNOSIS — M0579 Rheumatoid arthritis with rheumatoid factor of multiple sites without organ or systems involvement: Secondary | ICD-10-CM | POA: Diagnosis not present

## 2018-08-15 DIAGNOSIS — Z79899 Other long term (current) drug therapy: Secondary | ICD-10-CM | POA: Diagnosis not present

## 2018-08-15 DIAGNOSIS — Q667 Congenital pes cavus, unspecified foot: Secondary | ICD-10-CM | POA: Diagnosis not present

## 2018-08-15 DIAGNOSIS — Z8261 Family history of arthritis: Secondary | ICD-10-CM | POA: Insufficient documentation

## 2018-08-15 DIAGNOSIS — Z8719 Personal history of other diseases of the digestive system: Secondary | ICD-10-CM

## 2018-08-15 MED ORDER — HYDROXYCHLOROQUINE SULFATE 200 MG PO TABS: 200.0000 mg | ORAL_TABLET | Freq: Two times a day (BID) | ORAL | 0 refills | Status: DC

## 2018-08-15 NOTE — Patient Instructions (Addendum)
Hydroxychloroquine tablets What is this medicine? HYDROXYCHLOROQUINE (hye drox ee KLOR oh kwin) is used to treat rheumatoid arthritis and systemic lupus erythematosus. It is also used to treat malaria. This medicine may be used for other purposes; ask your health care provider or pharmacist if you have questions. COMMON BRAND NAME(S): Plaquenil, Quineprox What should I tell my health care provider before I take this medicine? They need to know if you have any of these conditions: -diabetes -eye disease, vision problems -G6PD deficiency -history of blood diseases -history of irregular heartbeat -if you often drink alcohol -kidney disease -liver disease -porphyria -psoriasis -seizures -an unusual or allergic reaction to chloroquine, hydroxychloroquine, other medicines, foods, dyes, or preservatives -pregnant or trying to get pregnant -breast-feeding How should I use this medicine? Take this medicine by mouth with a glass of water. Follow the directions on the prescription label. Avoid taking antacids within 4 hours of taking this medicine. It is best to separate these medicines by at least 4 hours. Do not cut, crush or chew this medicine. You can take it with or without food. If it upsets your stomach, take it with food. Take your medicine at regular intervals. Do not take your medicine more often than directed. Take all of your medicine as directed even if you think you are better. Do not skip doses or stop your medicine early. Talk to your pediatrician regarding the use of this medicine in children. While this drug may be prescribed for selected conditions, precautions do apply. Overdosage: If you think you have taken too much of this medicine contact a poison control center or emergency room at once. NOTE: This medicine is only for you. Do not share this medicine with others. What if I miss a dose? If you miss a dose, take it as soon as you can. If it is almost time for your next dose,  take only that dose. Do not take double or extra doses. What may interact with this medicine? Do not take this medicine with any of the following medications: -cisapride -dofetilide -dronedarone -live virus vaccines -penicillamine -pimozide -thioridazine -ziprasidone This medicine may also interact with the following medications: -ampicillin -antacids -cimetidine -cyclosporine -digoxin -medicines for diabetes, like insulin, glipizide, glyburide -medicines for seizures like carbamazepine, phenobarbital, phenytoin -mefloquine -methotrexate -other medicines that prolong the QT interval (cause an abnormal heart rhythm) -praziquantel This list may not describe all possible interactions. Give your health care provider a list of all the medicines, herbs, non-prescription drugs, or dietary supplements you use. Also tell them if you smoke, drink alcohol, or use illegal drugs. Some items may interact with your medicine. What should I watch for while using this medicine? Tell your doctor or healthcare professional if your symptoms do not start to get better or if they get worse. Avoid taking antacids within 4 hours of taking this medicine. It is best to separate these medicines by at least 4 hours. Tell your doctor or health care professional right away if you have any change in your eyesight. Your vision and blood may be tested before and during use of this medicine. This medicine can make you more sensitive to the sun. Keep out of the sun. If you cannot avoid being in the sun, wear protective clothing and use sunscreen. Do not use sun lamps or tanning beds/booths. What side effects may I notice from receiving this medicine? Side effects that you should report to your doctor or health care professional as soon as possible: -allergic reactions like skin rash,   itching or hives, swelling of the face, lips, or tongue -changes in vision -decreased hearing or ringing of the ears -redness,  blistering, peeling or loosening of the skin, including inside the mouth -seizures -sensitivity to light -signs and symptoms of a dangerous change in heartbeat or heart rhythm like chest pain; dizziness; fast or irregular heartbeat; palpitations; feeling faint or lightheaded, falls; breathing problems -signs and symptoms of liver injury like dark yellow or brown urine; general ill feeling or flu-like symptoms; light-colored stools; loss of appetite; nausea; right upper belly pain; unusually weak or tired; yellowing of the eyes or skin -signs and symptoms of low blood sugar such as feeling anxious; confusion; dizziness; increased hunger; unusually weak or tired; sweating; shakiness; cold; irritable; headache; blurred vision; fast heartbeat; loss of consciousness -uncontrollable head, mouth, neck, arm, or leg movements Side effects that usually do not require medical attention (report to your doctor or health care professional if they continue or are bothersome): -anxious -diarrhea -dizziness -hair loss -headache -irritable -loss of appetite -nausea, vomiting -stomach pain This list may not describe all possible side effects. Call your doctor for medical advice about side effects. You may report side effects to FDA at 1-800-FDA-1088. Where should I keep my medicine? Keep out of the reach of children. In children, this medicine can cause overdose with small doses. Store at room temperature between 15 and 30 degrees C (59 and 86 degrees F). Protect from moisture and light. Throw away any unused medicine after the expiration date. NOTE: This sheet is a summary. It may not cover all possible information. If you have questions about this medicine, talk to your doctor, pharmacist, or health care provider.  2019 Elsevier/Gold Standard (2015-10-01 14:16:15)  Standing Labs We placed an order today for your standing lab work.    Please come back and get your standing labs in 1 month, 3 months and  then every 5 months  We have open lab daily Monday through Thursday from 8:30-12:30 PM and 1:30-4:30 PM and Friday from 8:30-12:30 PM and 1:30 -4:00 PM at the office of Dr. Bo Merino.   You may experience shorter wait times on Monday and Friday afternoons. The office is located at [address] [place], [place], [place] [address] No appointment is necessary.   Labs are drawn by [facility].  You may receive a bill from [facility] for your lab work.  If you wish to have your labs drawn at another location, please call the office 24 hours in advance to send orders.  If you have any questions regarding directions or hours of operation,  please call [phone].   Just as a reminder please drink plenty of water prior to coming for your lab work. Thanks!  Vaccines You are taking a medication(s) that can suppress your immune system.  The following immunizations are recommended: . Flu annually . Pneumonia (Pneumovax 23 and Prevnar 13 spaced at least 1 year apart) . Shingrix  Please check with your PCP to make sure you are up to date.

## 2018-08-15 NOTE — Progress Notes (Signed)
Pharmacy Note  Subjective: Patient presents today to the [facility] to see Dr. Estanislado Pandy.  Patient seen by the pharmacist for counseling on hydroxychloroquine rheumatoid arthritis. She is naive to therapy.  Objective: CMP     Component Value Date/Time   NA 141 07/20/2018 0951   K 4.6 07/20/2018 0951   CL 108 07/20/2018 0951   CO2 29 07/20/2018 0951   GLUCOSE 100 (H) 07/20/2018 0951   BUN 15 07/20/2018 0951   CREATININE 0.72 07/20/2018 0951   CALCIUM 9.2 07/20/2018 0951   PROT 6.6 07/20/2018 0951   AST 21 07/20/2018 0951   ALT 29 07/20/2018 0951   BILITOT 0.3 07/20/2018 0951   GFRNONAA 95 07/20/2018 0951   GFRAA 110 07/20/2018 0951    CBC    Component Value Date/Time   WBC 5.6 07/20/2018 0951   RBC 4.81 07/20/2018 0951   HGB 13.9 07/20/2018 0951   HCT 41.5 07/20/2018 0951   PLT 255 07/20/2018 0951   MCV 86.3 07/20/2018 0951   MCH 28.9 07/20/2018 0951   MCHC 33.5 07/20/2018 0951   RDW 13.5 07/20/2018 0951   LYMPHSABS 1,417 07/20/2018 0951   EOSABS 151 07/20/2018 0951   BASOSABS 39 07/20/2018 0951    Assessment/Plan: Patient was counseled on the purpose, proper use, and adverse effects of hydroxychloroquine including nausea/diarrhea, skin rash, headaches, and sun sensitivity.  Discussed importance of annual eye exams while on hydroxychloroquine to monitor to ocular toxicity and discussed importance of frequent laboratory monitoring.  Provided patient with eye exam form for baseline ophthalmologic exam and standing lab instructions.  She prefers to have labs drawn at [facility] in [facility] and will call in advance to release orders.  Provided patient with educational materials on hydroxychloroquine and answered all questions.  Patient consented to hydroxychloroquine.  Will upload consent in the media tab.    Dose will be Plaquenil 200 mg twice daily.   All questions encouraged and answered.  Instructed patient to call with any further questions or  concerns.  Mariella Saa, PharmD, [facility] Rheumatology Clinical Pharmacist  08/15/2018 3:32 PM

## 2018-08-16 ENCOUNTER — Telehealth: Payer: Self-pay | Admitting: Pharmacist

## 2018-08-16 NOTE — Telephone Encounter (Signed)
Received a Prior Authorization request from [facility] for Plaquneil. Authorization has been submitted to patient's insurance via Cover My Meds.   Authorization has been APPROVED from 08/16/2018 through 08/14/2021.  Key: [id number] - Rx #: [id number]

## 2018-09-06 ENCOUNTER — Ambulatory Visit
Admission: RE | Admit: 2018-09-06 | Discharge: 2018-09-06 | Disposition: A | Discharge status: Home / Self Care | Payer: BC Managed Care – PPO | Source: Ambulatory Visit | Attending: Emergency Medicine | Admitting: Emergency Medicine

## 2018-09-06 ENCOUNTER — Other Ambulatory Visit: Payer: Self-pay

## 2018-09-06 DIAGNOSIS — J069 Acute upper respiratory infection, unspecified: Secondary | ICD-10-CM | POA: Insufficient documentation

## 2018-09-06 DIAGNOSIS — J988 Other specified respiratory disorders: Secondary | ICD-10-CM

## 2018-09-06 DIAGNOSIS — R51 Headache: Secondary | ICD-10-CM | POA: Diagnosis present

## 2018-09-06 DIAGNOSIS — Z20828 Contact with and (suspected) exposure to other viral communicable diseases: Secondary | ICD-10-CM | POA: Diagnosis not present

## 2018-09-06 DIAGNOSIS — M0579 Rheumatoid arthritis with rheumatoid factor of multiple sites without organ or systems involvement: Secondary | ICD-10-CM | POA: Insufficient documentation

## 2018-09-06 DIAGNOSIS — Z79899 Other long term (current) drug therapy: Secondary | ICD-10-CM | POA: Diagnosis not present

## 2018-09-06 MED ORDER — ALBUTEROL SULFATE HFA 108 (90 BASE) MCG/ACT IN AERS: 1.0000 | INHALATION_SPRAY | Freq: Four times a day (QID) | RESPIRATORY_TRACT | 0 refills | Status: DC | PRN

## 2018-09-06 MED ORDER — BENZONATATE 100 MG PO CAPS: 100.0000 mg | ORAL_CAPSULE | Freq: Three times a day (TID) | ORAL | 0 refills | Status: DC

## 2018-09-06 NOTE — ED Provider Notes (Addendum)
[facility] EMERGENCY DEPT VIDEO VISIT Provider Note   CSN: [id number] Arrival date & time: 09/06/18  1139    History   Chief Complaint No chief complaint on file.   HPI Brittany Malone is a 53 y.o. female.     HPI  53 year old female on video call with complaints of respiratory infection.  She notes that her husband was sick last week, he has tested positive for COVID-19.  She notes that 3 days ago she developed headache, scratchy throat, nasal congestion and cough.  She denies any significant fever noting her temperature was 99.6 yesterday and normal this morning..  She reports no shortness of breath at rest but does have some shortness of breath with ambulation.  She reports that she has an O2 sensor at home her oxygen has been around 93-94% and does not drop when she ambulates.  She denies any chest pain.  She is a non-smoker with no history of smoking no history of asthma she does have a history of sinus and bronchitis infections.  She does note that she has a history of rheumatoid arthritis and is taking Plaquenil.  She does not feel she needs to be tested for COVID is her husband is sick with cold which makes it very likely that she has it.   Past Medical History:  Diagnosis Date  . Insomnia     Patient Active Problem List   Diagnosis Date Noted  . Rheumatoid arthritis involving multiple sites with positive rheumatoid factor ([facility]) 08/15/2018  . Family history of rheumatoid arthritis 08/15/2018  . Chronic uveitis of both eyes 08/15/2018  . Pes cavus 08/15/2018  . History of gastroesophageal reflux (GERD) 08/15/2018    Past Surgical History:  Procedure Laterality Date  . ABDOMINAL HYSTERECTOMY    . ANKLE SURGERY       OB History   No obstetric history on file.      Home Medications    Prior to Admission medications   Medication Sig Start Date End Date Taking? Authorizing Provider  albuterol (VENTOLIN HFA) 108 (90 Base) MCG/ACT inhaler Inhale 1-2 puffs into the lungs  every 6 (six) hours as needed for wheezing or shortness of breath. 09/06/18   Jelisa , Dellis Filbert, PA-C  amitriptyline (ELAVIL) 25 MG tablet Take 25 mg by mouth at bedtime.    [provider]  benzonatate (TESSALON) 100 MG capsule Take 1 capsule (100 mg total) by mouth every 8 (eight) hours. 09/06/18   Oluwatosin Higginson, Dellis Filbert, PA-C  budesonide-formoterol (SYMBICORT) 160-4.5 MCG/ACT inhaler Inhale 2 puffs into the lungs 2 (two) times daily as needed.     [provider]  cetirizine (ZYRTEC) 10 MG tablet Take 10 mg by mouth as needed for allergies.    [provider]  fluticasone (FLONASE) 50 MCG/ACT nasal spray Place into both nostrils as needed for allergies or rhinitis.    [provider]  hydroxychloroquine (PLAQUENIL) 200 MG tablet Take 1 tablet (200 mg total) by mouth 2 (two) times daily. 08/15/18   Bo Merino, MD  IBUPROFEN PO Take by mouth as needed.    [provider]  MELATONIN PO Take by mouth as needed.    [provider]  Pseudoephedrine HCl (SUDAFED PO) Take by mouth as needed.    [provider]    Family History Family History  Problem Relation Age of Onset  . Lupus Mother   . Fibromyalgia Mother   . Hypertension Father   . High Cholesterol Father   . Kidney Stones Brother   .  Healthy Son   . Hypertension Daughter   . Migraines Daughter     Social History Social History   Tobacco Use  . Smoking status: Never Smoker  . Smokeless tobacco: Never Used  Substance Use Topics  . Alcohol use: Yes    Comment: occ  . Drug use: Never     Allergies   Penicillins   Review of Systems Review of Systems  -Negative- chest pain, fever Postive- SOB, cough, congestion, immunocompromised     Physical Exam Updated Vital Signs There were no vitals taken for this visit.  Physical Exam    Pt speaking in full sentences in no acute distress- no signs of cyanosis or pallor, tolerating PO, cough noted throughout evaluation     ED Treatments / Results  Labs (all labs ordered are listed, but only abnormal results are displayed) Labs Reviewed - No data to display  EKG None  Radiology No results found.  Procedures Procedures (including critical care time)  Medications Ordered in ED Medications - No data to display   Initial Impression / Assessment and Plan / ED Course  I have reviewed the triage vital signs and the nursing notes.  Pertinent labs & imaging results that were available during my care of the patient were reviewed by me and considered in my medical decision making (see chart for details).        36 YOF presents today with likely respiratory infection. She is immunocompromised. She is very well appearing in no acute distress, she is speaking in full sentences without difficulty. This is likely Covid 19 given her husbands recent diagnosis. She does not think testing is needed,I agree. She has reassuring reported 02 sats with no drop with ambulation. I feel she is stable and able to continue care at home. She will come to the ED immediately if she develops an new or worsening signs or symptoms. She verbalized understanding and agreement to today plan.   Brittany Malone was evaluated in Emergency Department on 09/06/2018 for the symptoms described in the history of present illness. She was evaluated in the context of the global COVID-19 pandemic, which necessitated consideration that the patient might be at risk for infection with the SARS-CoV-2 virus that causes COVID-19. Institutional protocols and algorithms that pertain to the evaluation of patients at risk for COVID-19 are in a state of rapid change based on information released by regulatory bodies including the CDC and federal and state organizations. These policies and algorithms were followed during the patient's care in the ED.  This encounter was completed through [facility] at the request  of and with the consent of the patient.  The encounter took place during the COVID-19 National pandemic and limiting virus exposure to community at cute care setting is considered an important step in reducing transmission of the disease.  The technology provided at the hospital for this encounter her compliance with [place] laws and included to stop with dual screen monitor camera and audio headset.   Final Clinical Impressions(s) / ED Diagnoses   Final diagnoses:  Respiratory infection    ED Discharge Orders         Ordered    albuterol (VENTOLIN HFA) 108 (90 Base) MCG/ACT inhaler  Every 6 hours PRN     09/06/18 1226    benzonatate (TESSALON) 100 MG capsule  Every 8 hours     09/06/18 1226           Brittany Malone,  Dellis Filbert, PA-C 09/06/18 1426    Jerick Khachatryan, Dellis Filbert, PA-C 09/06/18 1433    Gareth Morgan, MD 09/08/18 2035

## 2018-09-06 NOTE — Discharge Instructions (Signed)
Please read attached information. If you experience any new or worsening signs or symptoms please return to the emergency room for evaluation. Please follow-up with your primary care provider or specialist as discussed. Please use medication prescribed only as directed and discontinue taking if you have any concerning signs or symptoms.   °

## 2018-09-18 ENCOUNTER — Emergency Department (INDEPENDENT_AMBULATORY_CARE_PROVIDER_SITE_OTHER)
Admission: EM | Admit: 2018-09-18 | Discharge: 2018-09-18 | Disposition: A | Discharge status: Home / Self Care | Payer: BC Managed Care – PPO | Source: Home / Self Care | Attending: Family Medicine | Admitting: Family Medicine

## 2018-09-18 ENCOUNTER — Other Ambulatory Visit: Payer: Self-pay

## 2018-09-18 DIAGNOSIS — Z20822 Contact with and (suspected) exposure to covid-19: Secondary | ICD-10-CM

## 2018-09-18 DIAGNOSIS — J9801 Acute bronchospasm: Secondary | ICD-10-CM | POA: Diagnosis not present

## 2018-09-18 DIAGNOSIS — J029 Acute pharyngitis, unspecified: Secondary | ICD-10-CM | POA: Diagnosis not present

## 2018-09-18 DIAGNOSIS — R0789 Other chest pain: Secondary | ICD-10-CM | POA: Diagnosis not present

## 2018-09-18 DIAGNOSIS — Z20828 Contact with and (suspected) exposure to other viral communicable diseases: Secondary | ICD-10-CM | POA: Diagnosis not present

## 2018-09-18 DIAGNOSIS — J0100 Acute maxillary sinusitis, unspecified: Secondary | ICD-10-CM

## 2018-09-18 HISTORY — DX: Unspecified osteoarthritis, unspecified site: M19.90

## 2018-09-18 LAB — POCT RAPID STREP A (OFFICE): Rapid Strep A Screen: NEGATIVE

## 2018-09-18 MED ORDER — DOXYCYCLINE HYCLATE 100 MG PO CAPS: 100.0000 mg | ORAL_CAPSULE | Freq: Two times a day (BID) | ORAL | 0 refills | Status: DC

## 2018-09-18 MED ORDER — METHYLPREDNISOLONE SODIUM SUCC 40 MG IJ SOLR
80.0000 mg | Freq: Once | INTRAMUSCULAR | Status: AC
  Administered 2018-09-18: 80 mg via INTRAMUSCULAR

## 2018-09-18 NOTE — Discharge Instructions (Signed)
°  There is a high likelihood that you have Covid-19 due to your husband testing positive.  You may also have a secondary bacterial infection given your history of sinus infections. Please take the doxycycline as prescribed to help with sinus infection. Be sure to complete the entire course of the medication.  Your test results may take anywhere from 2-14 days.  It is advised that you isolate at home along with any other close family members until your results are back. If you MUST go out, please wear a mask at all times and practice social distancing to help prevent the spread of the virus.  Encourage others in your household to do the same.    Call 911 or go to emergency department if symptoms significantly worsening. Let them know you are awaiting Covid test results but your husband did test positive.

## 2018-09-18 NOTE — ED Triage Notes (Signed)
Husband was tested for COVID on July 2nd, tested positive.  Pt started with fatigue, sinus pressure, headache, ears full sore throat, cough.

## 2018-09-18 NOTE — ED Provider Notes (Signed)
[name]    CSN: [id number] Arrival date & time: 09/18/18  1004     History   Chief Complaint Chief Complaint  Patient presents with  . Cough  . Sore Throat  . Headache    HPI Brittany Malone is a 53 y.o. female.   HPI Brittany Malone is a 53 y.o. female presenting to UC with c/o 2 weeks of intermittent waxing and waning fatigue, sinus pressure, mild congestion, HA, sore throat, cough, and ear fullness.  Her husband tested positive for Covid-19 on July 2nd. They both got sick about the same time and have been quarantined at home together. Pt thought she was getting better last week so she has never been tested but is concerned she may have a superimposed sinus infection as well.  She does report doing a TeleMedicine appointment with an emergency department and was prescribed Tessalon and albuterol inhaler. No hx of asthma but she does need inhaler and steroids at times when sick.    She has been using her inhaler 3-4 times daily and is not sure it has been helping. Denies fever, chills, n/v/d. Denies SOB at this time.    Past Medical History:  Diagnosis Date  . Arthritis   . Insomnia     Patient Active Problem List   Diagnosis Date Noted  . Rheumatoid arthritis involving multiple sites with positive rheumatoid factor ([place]) 08/15/2018  . Family history of rheumatoid arthritis 08/15/2018  . Chronic uveitis of both eyes 08/15/2018  . Pes cavus 08/15/2018  . History of gastroesophageal reflux (GERD) 08/15/2018    Past Surgical History:  Procedure Laterality Date  . ABDOMINAL HYSTERECTOMY    . ANKLE SURGERY      OB History   No obstetric history on file.      Home Medications    Prior to Admission medications   Medication Sig Start Date End Date Taking? Authorizing Provider  albuterol (VENTOLIN HFA) 108 (90 Base) MCG/ACT inhaler Inhale 1-2 puffs into the lungs every 6 (six) hours as needed for wheezing or shortness of breath. 09/06/18   Hedges, Dellis Filbert,  PA-C  amitriptyline (ELAVIL) 25 MG tablet Take 25 mg by mouth at bedtime.    [provider]  benzonatate (TESSALON) 100 MG capsule Take 1 capsule (100 mg total) by mouth every 8 (eight) hours. 09/06/18   Hedges, Dellis Filbert, PA-C  budesonide-formoterol (SYMBICORT) 160-4.5 MCG/ACT inhaler Inhale 2 puffs into the lungs 2 (two) times daily as needed.     [provider]  cetirizine (ZYRTEC) 10 MG tablet Take 10 mg by mouth as needed for allergies.    [provider]  doxycycline (VIBRAMYCIN) 100 MG capsule Take 1 capsule (100 mg total) by mouth 2 (two) times daily. One po bid x 7 days 09/18/18   Noe Gens, PA-C  fluticasone [facility]) 50 MCG/ACT nasal spray Place into both nostrils as needed for allergies or rhinitis.    [provider]  hydroxychloroquine (PLAQUENIL) 200 MG tablet Take 1 tablet (200 mg total) by mouth 2 (two) times daily. 08/15/18   Bo Merino, MD  IBUPROFEN PO Take by mouth as needed.    [provider]  MELATONIN PO Take by mouth as needed.    [provider]  Pseudoephedrine HCl (SUDAFED PO) Take by mouth as needed.    [provider]    Family History Family History  Problem Relation Age of Onset  . Lupus Mother   . Fibromyalgia Mother   . Hypertension  Father   . High Cholesterol Father   . Kidney Stones Brother   . Healthy Son   . Hypertension Daughter   . Migraines Daughter     Social History Social History   Tobacco Use  . Smoking status: Never Smoker  . Smokeless tobacco: Never Used  Substance Use Topics  . Alcohol use: Yes    Comment: occ  . Drug use: Never     Allergies   Penicillins   Review of Systems Review of Systems  Constitutional: Positive for fatigue. Negative for chills and fever.  HENT: Positive for congestion, ear pain, sinus pressure, sinus pain and sore throat. Negative for trouble swallowing and voice change.   Respiratory: Positive for cough, chest tightness and  shortness of breath (occasionally).   Cardiovascular: Negative for chest pain and palpitations.  Gastrointestinal: Negative for abdominal pain, diarrhea, nausea and vomiting.  Musculoskeletal: Negative for arthralgias, back pain and myalgias.  Skin: Negative for rash.  Neurological: Positive for headaches. Negative for dizziness and light-headedness.     Physical Exam Triage Vital Signs ED Triage Vitals  Enc Vitals Group     BP 09/18/18 1155 135/89     Pulse Rate 09/18/18 1155 86     Resp 09/18/18 1155 20     Temp 09/18/18 1155 (!) 97 F (36.1 C)     Temp Source 09/18/18 1155 Oral     SpO2 09/18/18 1155 97 %     Weight 09/18/18 1156 257 lb (116.6 kg)     Height 09/18/18 1156 5\' 7"  (1.702 m)     Head Circumference --      Peak Flow --      Pain Score --      Pain Loc --      Pain Edu? --      Excl. in [place]? --    No data found.  Updated Vital Signs BP 135/89 (BP Location: Right Arm)   Pulse 86   Temp (!) 97 F (36.1 C) (Oral)   Resp 20   Ht 5\' 7"  (1.702 m)   Wt 257 lb (116.6 kg)   SpO2 97%   BMI 40.25 kg/m   Visual Acuity Right Eye Distance:   Left Eye Distance:   Bilateral Distance:    Right Eye Near:   Left Eye Near:    Bilateral Near:     Physical Exam Vitals signs and nursing note reviewed.  Constitutional:      General: She is not in acute distress.    Appearance: She is well-developed. She is not ill-appearing, toxic-appearing or diaphoretic.  HENT:     Head: Normocephalic and atraumatic.     Right Ear: Tympanic membrane normal.     Left Ear: Tympanic membrane normal.     Nose:     Right Sinus: Maxillary sinus tenderness present. No frontal sinus tenderness.     Left Sinus: Maxillary sinus tenderness present. No frontal sinus tenderness.     Mouth/Throat:     Lips: Pink.     Mouth: Mucous membranes are moist.     Pharynx: Oropharynx is clear. Uvula midline.  Neck:     Musculoskeletal: Normal range of motion and neck supple.  Cardiovascular:      Rate and Rhythm: Normal rate and regular rhythm.  Pulmonary:     Effort: Pulmonary effort is normal. No respiratory distress.     Breath sounds: Normal breath sounds. No stridor. No wheezing, rhonchi or rales.  Musculoskeletal: Normal range of motion.  Lymphadenopathy:     Cervical: No cervical adenopathy.  Skin:    General: Skin is warm and dry.  Neurological:     Mental Status: She is alert and oriented to person, place, and time.  Psychiatric:        Behavior: Behavior normal.      UC Treatments / Results  Labs (all labs ordered are listed, but only abnormal results are displayed) Labs Reviewed  NOVEL CORONAVIRUS, NAA  STREP A DNA PROBE  NOVEL CORONAVIRUS, NAA  POCT RAPID STREP A (OFFICE)    EKG   Radiology No results found.  Procedures Procedures (including critical care time)  Medications Ordered in UC Medications  methylPREDNISolone sodium succinate (SOLU-MEDROL) 40 mg/mL injection 80 mg (80 mg Intramuscular Given 09/18/18 1223)    Initial Impression / Assessment and Plan / UC Course  I have reviewed the triage vital signs and the nursing notes.  Pertinent labs & imaging results that were available during my care of the patient were reviewed by me and considered in my medical decision making (see chart for details).     Hx of sinusitis, sinus tenderness noted on exam. Pt has had URI symptoms for over 2 weeks, will start pt on antibiotics for suspected bacterial sinusitis. Covid-19 test also pending AVS provided.  Final Clinical Impressions(s) / UC Diagnoses   Final diagnoses:  Sore throat  Chest tightness  Close Exposure to Covid-19 Virus  Acute bronchospasm  Acute non-recurrent maxillary sinusitis     Discharge Instructions      There is a high likelihood that you have Covid-19 due to your husband testing positive.  You may also have a secondary bacterial infection given your history of sinus infections. Please take the doxycycline as  prescribed to help with sinus infection. Be sure to complete the entire course of the medication.  Your test results may take anywhere from 2-14 days.  It is advised that you isolate at home along with any other close family members until your results are back. If you MUST go out, please wear a mask at all times and practice social distancing to help prevent the spread of the virus.  Encourage others in your household to do the same.    Call 911 or go to emergency department if symptoms significantly worsening. Let them know you are awaiting Covid test results but your husband did test positive.      ED Prescriptions    Medication Sig Dispense Auth. Provider   doxycycline (VIBRAMYCIN) 100 MG capsule Take 1 capsule (100 mg total) by mouth 2 (two) times daily. One po bid x 7 days 14 capsule Noe Gens, [place]     Controlled Substance Prescriptions [place] Controlled Substance Registry consulted? Not Applicable   Tyrell Antonio 09/18/18 1424

## 2018-09-19 LAB — STREP A DNA PROBE: Group A Strep Probe: NOT DETECTED

## 2018-09-20 ENCOUNTER — Encounter: Payer: Self-pay | Admitting: Rheumatology

## 2018-09-20 LAB — NOVEL CORONAVIRUS, NAA: SARS-CoV-2, NAA: DETECTED — AB

## 2018-10-12 DIAGNOSIS — M069 Rheumatoid arthritis, unspecified: Secondary | ICD-10-CM | POA: Diagnosis not present

## 2018-10-12 DIAGNOSIS — Z79899 Other long term (current) drug therapy: Secondary | ICD-10-CM | POA: Diagnosis not present

## 2018-10-12 DIAGNOSIS — B5801 Toxoplasma chorioretinitis: Secondary | ICD-10-CM | POA: Diagnosis not present

## 2018-10-27 ENCOUNTER — Ambulatory Visit: Payer: BC Managed Care – PPO | Admitting: Family Medicine

## 2018-10-30 ENCOUNTER — Other Ambulatory Visit: Payer: Self-pay | Admitting: *Deleted

## 2018-10-30 ENCOUNTER — Encounter: Payer: Self-pay | Admitting: Rheumatology

## 2018-10-30 DIAGNOSIS — Z79899 Other long term (current) drug therapy: Secondary | ICD-10-CM

## 2018-10-31 DIAGNOSIS — M25552 Pain in left hip: Secondary | ICD-10-CM | POA: Diagnosis not present

## 2018-10-31 DIAGNOSIS — M7062 Trochanteric bursitis, left hip: Secondary | ICD-10-CM | POA: Diagnosis not present

## 2018-10-31 DIAGNOSIS — Z79899 Other long term (current) drug therapy: Secondary | ICD-10-CM | POA: Diagnosis not present

## 2018-10-31 LAB — COMPLETE METABOLIC PANEL WITH GFR
AG Ratio: 1.6 (calc) (ref 1.0–2.5)
ALT: 30 U/L — ABNORMAL HIGH (ref 6–29)
AST: 21 U/L (ref 10–35)
Albumin: 4.3 g/dL (ref 3.6–5.1)
Alkaline phosphatase (APISO): 71 U/L (ref 37–153)
BUN: 16 mg/dL (ref 7–25)
CO2: 27 mmol/L (ref 20–32)
Calcium: 9.7 mg/dL (ref 8.6–10.4)
Chloride: 106 mmol/L (ref 98–110)
Creat: 0.69 mg/dL (ref 0.50–1.05)
GFR, Est African American: 114 mL/min/{1.73_m2} (ref >=60)
GFR, Est Non African American: 98 mL/min/{1.73_m2} (ref >=60)
Globulin: 2.7 g/dL (calc) (ref 1.9–3.7)
Glucose, Bld: 95 mg/dL (ref 65–139)
Potassium: 4.6 mmol/L (ref 3.5–5.3)
Sodium: 140 mmol/L (ref 135–146)
Total Bilirubin: 0.4 mg/dL (ref 0.2–1.2)
Total Protein: 7 g/dL (ref 6.1–8.1)

## 2018-10-31 LAB — CBC WITH DIFFERENTIAL/PLATELET
Absolute Monocytes: 551 cells/uL (ref 200–950)
Basophils Absolute: 31 cells/uL (ref 0–200)
Basophils Relative: 0.6 %
Eosinophils Absolute: 109 cells/uL (ref 15–500)
Eosinophils Relative: 2.1 %
HCT: 41.3 % (ref 35.0–45.0)
Hemoglobin: 14.2 g/dL (ref 11.7–15.5)
Lymphs Abs: 1175 cells/uL (ref 850–3900)
MCH: 29.8 pg (ref 27.0–33.0)
MCHC: 34.4 g/dL (ref 32.0–36.0)
MCV: 86.8 fL (ref 80.0–100.0)
MPV: 9.9 fL (ref 7.5–12.5)
Monocytes Relative: 10.6 %
Neutro Abs: 3333 cells/uL (ref 1500–7800)
Neutrophils Relative %: 64.1 %
Platelets: 251 10*3/uL (ref 140–400)
RBC: 4.76 10*6/uL (ref 3.80–5.10)
RDW: 13.2 % (ref 11.0–15.0)
Total Lymphocyte: 22.6 %
WBC: 5.2 10*3/uL (ref 3.8–10.8)

## 2018-10-31 NOTE — Progress Notes (Signed)
Office Visit Note  Patient: Brittany Malone             Date of Birth: Mar 24, 1965           MRN: [id number]             PCP: Amador Cunas, FNP Referring: Amador Cunas, FNP Visit Date: 11/14/2018 Occupation: @GUAROCC @  Subjective:  Arthritis (Left hip bursitus )   History of Present Illness: Brittany Malone is a 53 y.o. female with history of seropositive rheumatoid arthritis and uveitis.  She states that she and her husband acquired coronavirus infection in the first week of July.  She was having increased bronchospasm and difficulty breathing and she got tested towards the end of July which was positive.  She still is experiencing fatigue.  No other symptoms currently.  She stayed on the Plaquenil the whole time.  She has not had any flare of rheumatoid arthritis.  She continues to have some discomfort in her hands and feet.  She has not noticed any joint swelling.  She had left trochanteric bursa cortisone injection about 2 weeks ago.  It helped to some extent.  Activities of Daily Living:  Patient reports morning stiffness for 1 hour.   Patient Reports nocturnal pain.  Difficulty dressing/grooming: Denies Difficulty climbing stairs: Denies Difficulty getting out of chair: Denies Difficulty using hands for taps, buttons, cutlery, and/or writing: Denies  Review of Systems  Constitutional: Positive for fatigue. Negative for night sweats, weight gain and weight loss.  HENT: Positive for mouth dryness. Negative for mouth sores, trouble swallowing, trouble swallowing and nose dryness.   Eyes: Negative for pain, redness, visual disturbance and dryness.  Respiratory: Negative for cough, shortness of breath and difficulty breathing.   Cardiovascular: Negative for chest pain, palpitations, hypertension, irregular heartbeat and swelling in legs/feet.  Gastrointestinal: Negative for blood in stool, constipation and diarrhea.  Endocrine: Negative for cold intolerance and increased urination.   Genitourinary: Negative for difficulty urinating and vaginal dryness.  Musculoskeletal: Positive for arthralgias, joint pain and morning stiffness. Negative for joint swelling, myalgias, muscle weakness, muscle tenderness and myalgias.  Skin: Negative for color change, rash, hair loss, skin tightness, ulcers and sensitivity to sunlight.  Allergic/Immunologic: Negative for susceptible to infections.  Neurological: Negative for dizziness, numbness, memory loss, night sweats and weakness.  Hematological: Negative for bruising/bleeding tendency and swollen glands.  Psychiatric/Behavioral: Positive for sleep disturbance. Negative for depressed mood. The patient is not nervous/anxious.     PMFS History:  Patient Active Problem List   Diagnosis Date Noted  . Rheumatoid arthritis involving multiple sites with positive rheumatoid factor ([place]) 08/15/2018  . Family history of rheumatoid arthritis 08/15/2018  . Chronic uveitis of both eyes 08/15/2018  . Pes cavus 08/15/2018  . History of gastroesophageal reflux (GERD) 08/15/2018    Past Medical History:  Diagnosis Date  . Arthritis   . Insomnia     Family History  Problem Relation Age of Onset  . Lupus Mother   . Fibromyalgia Mother   . Hypertension Father   . High Cholesterol Father   . Kidney Stones Brother   . Healthy Son   . Hypertension Daughter   . Migraines Daughter    Past Surgical History:  Procedure Laterality Date  . ABDOMINAL HYSTERECTOMY    . ANKLE SURGERY     Social History   Social History Narrative  . Not on file    There is no immunization history on file for this patient.   Objective: Vital  Signs: BP 130/81 (BP Location: Right Arm, Patient Position: Sitting, Cuff Size: Normal)   Pulse 84   Resp 18   Ht 5' 7.75" (1.721 m)   Wt 262 lb 6.4 oz (119 kg)   BMI 40.19 kg/m    Physical Exam Vitals signs and nursing note reviewed.  Constitutional:      Appearance: She is well-developed.  HENT:     Head:  Normocephalic and atraumatic.  Eyes:     Conjunctiva/sclera: Conjunctivae normal.  Neck:     Musculoskeletal: Normal range of motion.  Cardiovascular:     Rate and Rhythm: Normal rate and regular rhythm.     Heart sounds: Normal heart sounds.  Pulmonary:     Effort: Pulmonary effort is normal.     Breath sounds: Normal breath sounds.  Abdominal:     General: Bowel sounds are normal.     Palpations: Abdomen is soft.  Lymphadenopathy:     Cervical: No cervical adenopathy.  Skin:    General: Skin is warm and dry.     Capillary Refill: Capillary refill takes less than 2 seconds.  Neurological:     Mental Status: She is alert and oriented to person, place, and time.  Psychiatric:        Behavior: Behavior normal.      Musculoskeletal Exam: C-spine thoracic and lumbar spine with good range of motion.  Shoulder joints elbow joints wrist joint MCPs PIPs DIPs with good range of motion.  She had no tenderness or synovitis on examination.  Hip joints knee joints ankles MTPs PIPs with good range of motion with no synovitis.  She states that she has some discomfort over her right second and third toe base while walking.  CDAI Exam: CDAI Score: 0.6  Patient Global: 3 mm; Provider Global: 3 mm Swollen: 0 ; Tender: 0  Joint Exam   No joint exam has been documented for this visit   There is currently no information documented on the homunculus. Go to the Rheumatology activity and complete the homunculus joint exam.  Investigation: No additional findings.  Imaging: No results found.  Recent Labs: Lab Results  Component Value Date   WBC 5.2 10/31/2018   HGB 14.2 10/31/2018   PLT 251 10/31/2018   NA 140 10/31/2018   K 4.6 10/31/2018   CL 106 10/31/2018   CO2 27 10/31/2018   GLUCOSE 95 10/31/2018   BUN 16 10/31/2018   CREATININE 0.69 10/31/2018   BILITOT 0.4 10/31/2018   AST 21 10/31/2018   ALT 30 (H) 10/31/2018   PROT 7.0 10/31/2018   CALCIUM 9.7 10/31/2018   GFRAA 114  10/31/2018    Speciality Comments: No specialty comments available.  Procedures:  No procedures performed Allergies: Penicillins   Assessment / Plan:     Visit Diagnoses: Rheumatoid arthritis involving multiple sites with positive rheumatoid factor ([facility]) - Polyarthralgia, positive RF, positive anti-CCP.  Patient states she continues to have some discomfort in her hands and her feet.  Especially her feet have been more painful recently.  She denies any joint swelling.  I will schedule ultrasound of her bilateral feet to evaluate further.  I briefly discussed the use of methotrexate.  Handout was given and side effects were discussed.  She will review methotrexate at home.  If her ultrasound is positive for synovitis then we may consider placing her on methotrexate.  High risk medication use -  Plaquenil 200 mg 1 tablet twice daily started June 2020.  No baseline Plaquenil  eye exam on file. Most recent CBC/CMP within normal limits except for borderline elevated ALT.  Chronic uveitis of both eyes - From the age of 2 years until her 45s -she has had no recurrence.  Patient states she was recently evaluated by ophthalmologist and was negative for uveitis.  History of COVID-19 infection-patient had COVID-19 test positive on July 20.  She states she continues to have some fatigue.  History of diverticulosis  Pes cavus-it causes chronic discomfort in her feet.  Chronic SI joint pain-she continues to have some discomfort.  History of gastroesophageal reflux (GERD)  Family history of systemic lupus erythematosus (SLE) in mother  Other fatigue  History of melanoma  Orders: No orders of the defined types were placed in this encounter.  No orders of the defined types were placed in this encounter.   Face-to-face time spent with patient was 25 minutes. Greater than 50% of time was spent in counseling and coordination of care.  Follow-Up Instructions: Return in about 3 months (around  02/13/2019) for Rheumatoid arthritis.   Bo Merino, MD  Note - This record has been created using Editor, commissioning.  Chart creation errors have been sought, but may not always  have been located. Such creation errors do not reflect on  the standard of medical care.

## 2018-11-08 ENCOUNTER — Other Ambulatory Visit: Payer: Self-pay | Admitting: Rheumatology

## 2018-11-08 ENCOUNTER — Encounter: Payer: Self-pay | Admitting: *Deleted

## 2018-11-08 DIAGNOSIS — M0579 Rheumatoid arthritis with rheumatoid factor of multiple sites without organ or systems involvement: Secondary | ICD-10-CM

## 2018-11-08 NOTE — Telephone Encounter (Signed)
Last Visit: 08/15/18 Next Visit: 11/14/18 Labs: 10/31/18 ALT borderline elevated-30. AST WNL. Rest of CMP WNL. No baseline eye exam.   Sent message to patient via my chart advising patient we need PLQ eye exam.  Okay to refill PLQ?

## 2018-11-08 NOTE — Telephone Encounter (Signed)
Please advise patient to schedule PLQ eye exam ASAP.  Ok to refill

## 2018-11-14 ENCOUNTER — Encounter: Payer: Self-pay | Admitting: Physician Assistant

## 2018-11-14 ENCOUNTER — Other Ambulatory Visit: Payer: Self-pay

## 2018-11-14 ENCOUNTER — Ambulatory Visit (INDEPENDENT_AMBULATORY_CARE_PROVIDER_SITE_OTHER): Payer: BC Managed Care – PPO | Admitting: Rheumatology

## 2018-11-14 VITALS — BP 130/81 | HR 84 | Resp 18 | Ht 67.75 in | Wt 262.4 lb

## 2018-11-14 DIAGNOSIS — H2013 Chronic iridocyclitis, bilateral: Secondary | ICD-10-CM | POA: Diagnosis not present

## 2018-11-14 DIAGNOSIS — Z8269 Family history of other diseases of the musculoskeletal system and connective tissue: Secondary | ICD-10-CM

## 2018-11-14 DIAGNOSIS — G8929 Other chronic pain: Secondary | ICD-10-CM

## 2018-11-14 DIAGNOSIS — Z8719 Personal history of other diseases of the digestive system: Secondary | ICD-10-CM

## 2018-11-14 DIAGNOSIS — Q667 Congenital pes cavus, unspecified foot: Secondary | ICD-10-CM

## 2018-11-14 DIAGNOSIS — Z85820 Personal history of malignant melanoma of skin: Secondary | ICD-10-CM

## 2018-11-14 DIAGNOSIS — M0579 Rheumatoid arthritis with rheumatoid factor of multiple sites without organ or systems involvement: Secondary | ICD-10-CM | POA: Diagnosis not present

## 2018-11-14 DIAGNOSIS — Z79899 Other long term (current) drug therapy: Secondary | ICD-10-CM | POA: Diagnosis not present

## 2018-11-14 DIAGNOSIS — R5383 Other fatigue: Secondary | ICD-10-CM

## 2018-11-14 DIAGNOSIS — M533 Sacrococcygeal disorders, not elsewhere classified: Secondary | ICD-10-CM

## 2018-11-14 NOTE — Patient Instructions (Signed)
Methotrexate tablets What is this medicine? METHOTREXATE (METH oh TREX ate) is a chemotherapy drug used to treat cancer including breast cancer, leukemia, and lymphoma. This medicine can also be used to treat psoriasis and certain kinds of arthritis. This medicine may be used for other purposes; ask your health care provider or pharmacist if you have questions. COMMON BRAND NAME(S): Rheumatrex, Trexall What should I tell my health care provider before I take this medicine? They need to know if you have any of these conditions:  fluid in the stomach area or lungs  if you often drink alcohol  infection or immune system problems  kidney disease or on hemodialysis  liver disease  low blood counts, like low white cell, platelet, or red cell counts  lung disease  radiation therapy  stomach ulcers  ulcerative colitis  an unusual or allergic reaction to methotrexate, other medicines, foods, dyes, or preservatives  pregnant or trying to get pregnant  breast-feeding How should I use this medicine? Take this medicine by mouth with a glass of water. Follow the directions on the prescription label. Take your medicine at regular intervals. Do not take it more often than directed. Do not stop taking except on your doctor's advice. Make sure you know why you are taking this medicine and how often you should take it. If this medicine is used for a condition that is not cancer, like arthritis or psoriasis, it should be taken weekly, NOT daily. Taking this medicine more often than directed can cause serious side effects, even death. Talk to your healthcare provider about safe handling and disposal of this medicine. You may need to take special precautions. Talk to your pediatrician regarding the use of this medicine in children. While this drug may be prescribed for selected conditions, precautions do apply. Overdosage: If you think you have taken too much of this medicine contact a poison control  center or emergency room at once. NOTE: This medicine is only for you. Do not share this medicine with others. What if I miss a dose? If you miss a dose, talk with your doctor or health care professional. Do not take double or extra doses. What may interact with this medicine? This medicine may interact with the following medication:  acitretin  aspirin and aspirin-like medicines including salicylates  azathioprine  certain antibiotics like penicillins, tetracycline, and chloramphenicol  cyclosporine  gold  hydroxychloroquine  live virus vaccines  NSAIDs, medicines for pain and inflammation, like ibuprofen or naproxen  other cytotoxic agents  penicillamine  phenylbutazone  phenytoin  probenecid  retinoids such as isotretinoin and tretinoin  steroid medicines like prednisone or cortisone  sulfonamides like sulfasalazine and trimethoprim/sulfamethoxazole  theophylline This list may not describe all possible interactions. Give your health care provider a list of all the medicines, herbs, non-prescription drugs, or dietary supplements you use. Also tell them if you smoke, drink alcohol, or use illegal drugs. Some items may interact with your medicine. What should I watch for while using this medicine? Avoid alcoholic drinks. This medicine can make you more sensitive to the sun. Keep out of the sun. If you cannot avoid being in the sun, wear protective clothing and use sunscreen. Do not use sun lamps or tanning beds/booths. You may need blood work done while you are taking this medicine. Call your doctor or health care professional for advice if you get a fever, chills or sore throat, or other symptoms of a cold or flu. Do not treat yourself. This drug decreases your   body's ability to fight infections. Try to avoid being around people who are sick. This medicine may increase your risk to bruise or bleed. Call your doctor or health care professional if you notice any unusual  bleeding. Check with your doctor or health care professional if you get an attack of severe diarrhea, nausea and vomiting, or if you sweat a lot. The loss of too much body fluid can make it dangerous for you to take this medicine. Talk to your doctor about your risk of cancer. You may be more at risk for certain types of cancers if you take this medicine. Both men and women must use effective birth control with this medicine. Do not become pregnant while taking this medicine or until at least 1 normal menstrual cycle has occurred after stopping it. Women should inform their doctor if they wish to become pregnant or think they might be pregnant. Men should not father a child while taking this medicine and for 3 months after stopping it. There is a potential for serious side effects to an unborn child. Talk to your health care professional or pharmacist for more information. Do not breast-feed an infant while taking this medicine. What side effects may I notice from receiving this medicine? Side effects that you should report to your doctor or health care professional as soon as possible:  allergic reactions like skin rash, itching or hives, swelling of the face, lips, or tongue  breathing problems or shortness of breath  diarrhea  dry, nonproductive cough  low blood counts - this medicine may decrease the number of white blood cells, red blood cells and platelets. You may be at increased risk for infections and bleeding.  mouth sores  redness, blistering, peeling or loosening of the skin, including inside the mouth  signs of infection - fever or chills, cough, sore throat, pain or trouble passing urine  signs and symptoms of bleeding such as bloody or black, tarry stools; red or dark-brown urine; spitting up blood or brown material that looks like coffee grounds; red spots on the skin; unusual bruising or bleeding from the eye, gums, or nose  signs and symptoms of kidney injury like trouble  passing urine or change in the amount of urine  signs and symptoms of liver injury like dark yellow or brown urine; general ill feeling or flu-like symptoms; light-colored stools; loss of appetite; nausea; right upper belly pain; unusually weak or tired; yellowing of the eyes or skin Side effects that usually do not require medical attention (report to your doctor or health care professional if they continue or are bothersome):  dizziness  hair loss  tiredness  upset stomach  vomiting This list may not describe all possible side effects. Call your doctor for medical advice about side effects. You may report side effects to FDA at 1-800-FDA-1088. Where should I keep my medicine? Keep out of the reach of children. Store at room temperature between 20 and 25 degrees C (68 and 77 degrees F). Protect from light. Throw away any unused medicine after the expiration date. NOTE: This sheet is a summary. It may not cover all possible information. If you have questions about this medicine, talk to your doctor, pharmacist, or health care provider.  2020 Elsevier/Gold Standard (2016-10-07 13:38:43)  

## 2018-11-15 DIAGNOSIS — Z1231 Encounter for screening mammogram for malignant neoplasm of breast: Secondary | ICD-10-CM | POA: Diagnosis not present

## 2018-11-15 DIAGNOSIS — L723 Sebaceous cyst: Secondary | ICD-10-CM | POA: Diagnosis not present

## 2018-11-16 ENCOUNTER — Ambulatory Visit (INDEPENDENT_AMBULATORY_CARE_PROVIDER_SITE_OTHER): Payer: BC Managed Care – PPO | Admitting: Family Medicine

## 2018-11-16 ENCOUNTER — Encounter: Payer: Self-pay | Admitting: Family Medicine

## 2018-11-16 ENCOUNTER — Other Ambulatory Visit: Payer: Self-pay

## 2018-11-16 VITALS — BP 112/76 | HR 93 | Temp 96.9°F | Ht 67.5 in | Wt 263.1 lb

## 2018-11-16 DIAGNOSIS — G4700 Insomnia, unspecified: Secondary | ICD-10-CM | POA: Diagnosis not present

## 2018-11-16 DIAGNOSIS — Z114 Encounter for screening for human immunodeficiency virus [HIV]: Secondary | ICD-10-CM | POA: Diagnosis not present

## 2018-11-16 DIAGNOSIS — Z1159 Encounter for screening for other viral diseases: Secondary | ICD-10-CM | POA: Diagnosis not present

## 2018-11-16 DIAGNOSIS — Z23 Encounter for immunization: Secondary | ICD-10-CM

## 2018-11-16 NOTE — Patient Instructions (Addendum)
Give [place] 2-3 business days to get the results of your labs back.   Keep the diet clean and stay active.  You may feel ill over the next 48 hours.  Sleep Hygiene Tips:  Do not watch TV or look at screens within 1 hour of going to bed. If you do, make sure there is a blue light filter (nighttime mode) involved.  Try to go to bed around the same time every night. Wake up at the same time within 1 hour of regular time. Ex: If you wake up at 7 AM for work, do not sleep past 8 AM on days that you don't work.  Do not drink alcohol before bedtime.  Do not consume caffeine-containing beverages after noon or within 9 hours of intended bedtime.  Get regular exercise/physical activity in your life, but not within 2 hours of planned bedtime.  Do not take naps.   Do not eat within 2 hours of planned bedtime.  Melatonin, 3-5 mg 30-60 minutes before planned bedtime may be helpful.   The bed should be for sleep or sex only. If after 20-30 minutes you are unable to fall asleep, get up and do something relaxing. Do this until you feel ready to go to sleep again.   Sleep is important to [place] all. Getting good sleep is imperative to adequate functioning during the day. Work with our counselors who are trained to help people obtain quality sleep. Call [phone] to schedule an appointment or if you are curious about insurance coverage/cost.  Let us know if you need anything.

## 2018-11-16 NOTE — Progress Notes (Signed)
Chief Complaint  Patient presents with  . New Patient (Initial Visit)       New Patient Visit SUBJECTIVE: HPI: Brittany Malone is an 53 y.o.female who is being seen for establishing care.  She is transferring care.  She has a history of insomnia.  She takes melatonin and Elavil 50 mg nightly.  She is tolerating the medicine well.  She has never had cognitive behavioral therapy.  She has never been screened for HIV or hepatitis C.  She is willing to get this today.  She was diagnosed with rheumatoid arthritis around 3 months ago.  She is currently following with the rheumatology team.  She was told that she needs to follow-up here to get her flu shot, shingles vaccine, and first of 2 pneumococcal vaccinations.  She has started on Plaquenil.  Tolerating well so far, questions any benefit.  Allergies  Allergen Reactions  . Penicillins Anaphylaxis    Past Medical History:  Diagnosis Date  . Arthritis   . Insomnia   . Rheumatoid arthritis [facility])    Past Surgical History:  Procedure Laterality Date  . ABDOMINAL HYSTERECTOMY    . ANKLE SURGERY     Family History  Problem Relation Age of Onset  . Lupus Mother   . Fibromyalgia Mother   . Hyperlipidemia Mother   . Hypertension Father   . High Cholesterol Father   . Hyperlipidemia Father   . Kidney Stones Brother   . Healthy Son   . Hypertension Daughter   . Migraines Daughter    Allergies  Allergen Reactions  . Penicillins Anaphylaxis    Current Outpatient Medications:  .  etodolac (LODINE) 400 MG tablet, Take by mouth., Disp: , Rfl:  .  fluticasone (FLONASE) 50 MCG/ACT nasal spray, Place into both nostrils as needed for allergies or rhinitis., Disp: , Rfl:  .  hydroxychloroquine (PLAQUENIL) 200 MG tablet, TAKE 1 TABLET BY MOUTH TWICE A DAY, Disp: 60 tablet, Rfl: 0 .  MELATONIN PO, Take by mouth as needed., Disp: , Rfl:  .  amitriptyline (ELAVIL) 50 MG tablet, Take 1 tablet (50 mg total) by mouth at bedtime as needed for  sleep., Disp: , Rfl:  .  cetirizine (ZYRTEC) 10 MG tablet, Take 10 mg by mouth as needed for allergies., Disp: , Rfl:   ROS Psych: +insomnia  MSK: +hand pain   OBJECTIVE: BP 112/76 (BP Location: Left Arm, Patient Position: Sitting, Cuff Size: Large)   Pulse 93   Temp (!) 96.9 F (36.1 C) (Temporal)   Ht 5' 7.5" (1.715 m)   Wt 263 lb 2 oz (119.4 kg)   SpO2 97%   BMI 40.60 kg/m   Constitutional: -  VS reviewed -  Well developed, well nourished, appears stated age -  No apparent distress  Psychiatric: -  Oriented to person, place, and time -  Memory intact -  Affect and mood normal -  Fluent conversation, good eye contact -  Judgment and insight age appropriate  Eye: -  Conjunctivae clear, no discharge -  Pupils symmetric, round, reactive to light  ENMT: -  MMM    Pharynx moist, no exudate, no erythema  Neck: -  No gross swelling, no palpable masses -  Thyroid midline, not enlarged, mobile, no palpable masses  Cardiovascular: -  RRR -  No bruits -  No LE edema  Respiratory: -  Normal respiratory effort, no accessory muscle use, no retraction -  Breath sounds equal, no wheezes, no ronchi, no crackles  Musculoskeletal: -  No clubbing, no cyanosis -  Gait normal  Skin: -  No significant lesion on inspection -  Warm and dry to palpation   ASSESSMENT/PLAN: Insomnia, unspecified type  Encounter for hepatitis C screening test for low risk patient - Plan: Hepatitis C antibody  Screening for HIV (human immunodeficiency virus) - Plan: HIV Antibody (routine testing w rflx)  Need for influenza vaccination - Plan: Flu Vaccine QUAD 6+ mos PF IM (Fluarix Quad PF)  Need for shingles vaccine - Plan: Varicella-zoster vaccine IM (Shingrix)  Need for vaccination against Streptococcus pneumoniae - Plan: Pneumococcal polysaccharide vaccine 23-valent greater than or equal to 2yo subcutaneous/IM  1- cont Elavil, LB BH info given, sleep hygiene given. Immunizations and hm updated.   Patient should return in 6 mo for CPE. 2 mo for 2nd PCV23 and Shingrix. The patient voiced understanding and agreement to the plan.   [place], DO 11/16/18  1:43 PM

## 2018-11-17 LAB — HEPATITIS C ANTIBODY
Hepatitis C Ab: NONREACTIVE
SIGNAL TO CUT-OFF: 0.01 (ref <1.00)

## 2018-11-17 LAB — HIV ANTIBODY (ROUTINE TESTING W REFLEX): HIV 1&2 Ab, 4th Generation: NONREACTIVE

## 2018-11-29 ENCOUNTER — Ambulatory Visit (INDEPENDENT_AMBULATORY_CARE_PROVIDER_SITE_OTHER): Payer: BC Managed Care – PPO | Admitting: Rheumatology

## 2018-11-29 ENCOUNTER — Ambulatory Visit (INDEPENDENT_AMBULATORY_CARE_PROVIDER_SITE_OTHER): Payer: BC Managed Care – PPO

## 2018-11-29 ENCOUNTER — Other Ambulatory Visit: Payer: Self-pay

## 2018-11-29 DIAGNOSIS — M79671 Pain in right foot: Secondary | ICD-10-CM | POA: Diagnosis not present

## 2018-11-29 DIAGNOSIS — M79672 Pain in left foot: Secondary | ICD-10-CM | POA: Diagnosis not present

## 2018-12-08 ENCOUNTER — Other Ambulatory Visit: Payer: Self-pay | Admitting: Rheumatology

## 2018-12-08 DIAGNOSIS — M0579 Rheumatoid arthritis with rheumatoid factor of multiple sites without organ or systems involvement: Secondary | ICD-10-CM

## 2018-12-08 NOTE — Telephone Encounter (Signed)
Last Visit: 11/14/18 Next visit: 02/14/19 Labs: 10/31/18 ALT borderline elevated-30. AST WNL. Rest of CMP WNL.  PLQ Eye Exam: 10/12/18 WNL  Okay to refill per Dr. Estanislado Pandy

## 2018-12-19 DIAGNOSIS — Z1231 Encounter for screening mammogram for malignant neoplasm of breast: Secondary | ICD-10-CM | POA: Diagnosis not present

## 2018-12-25 DIAGNOSIS — R922 Inconclusive mammogram: Secondary | ICD-10-CM | POA: Diagnosis not present

## 2018-12-25 DIAGNOSIS — R928 Other abnormal and inconclusive findings on diagnostic imaging of breast: Secondary | ICD-10-CM | POA: Diagnosis not present

## 2019-01-02 DIAGNOSIS — N6081 Other benign mammary dysplasias of right breast: Secondary | ICD-10-CM | POA: Diagnosis not present

## 2019-01-02 DIAGNOSIS — N6459 Other signs and symptoms in breast: Secondary | ICD-10-CM | POA: Diagnosis not present

## 2019-01-02 DIAGNOSIS — R921 Mammographic calcification found on diagnostic imaging of breast: Secondary | ICD-10-CM | POA: Diagnosis not present

## 2019-01-16 ENCOUNTER — Ambulatory Visit (INDEPENDENT_AMBULATORY_CARE_PROVIDER_SITE_OTHER): Payer: BC Managed Care – PPO | Admitting: *Deleted

## 2019-01-16 ENCOUNTER — Other Ambulatory Visit: Payer: Self-pay

## 2019-01-16 DIAGNOSIS — Z23 Encounter for immunization: Secondary | ICD-10-CM | POA: Diagnosis not present

## 2019-01-16 NOTE — Progress Notes (Signed)
Patient here for 2nd shingles vaccine.   Vaccine given in left deltoid and patient tolerated well. 

## 2019-02-09 ENCOUNTER — Encounter: Payer: Self-pay | Admitting: Rheumatology

## 2019-02-09 ENCOUNTER — Other Ambulatory Visit: Payer: Self-pay

## 2019-02-09 DIAGNOSIS — Z79899 Other long term (current) drug therapy: Secondary | ICD-10-CM

## 2019-02-13 DIAGNOSIS — Z79899 Other long term (current) drug therapy: Secondary | ICD-10-CM | POA: Diagnosis not present

## 2019-02-13 DIAGNOSIS — N9089 Other specified noninflammatory disorders of vulva and perineum: Secondary | ICD-10-CM | POA: Diagnosis not present

## 2019-02-13 LAB — COMPLETE METABOLIC PANEL WITH GFR
AG Ratio: 1.4 (calc) (ref 1.0–2.5)
ALT: 23 U/L (ref 6–29)
AST: 18 U/L (ref 10–35)
Albumin: 3.9 g/dL (ref 3.6–5.1)
Alkaline phosphatase (APISO): 67 U/L (ref 37–153)
BUN: 13 mg/dL (ref 7–25)
CO2: 27 mmol/L (ref 20–32)
Calcium: 9.5 mg/dL (ref 8.6–10.4)
Chloride: 107 mmol/L (ref 98–110)
Creat: 0.78 mg/dL (ref 0.50–1.05)
GFR, Est African American: 99 mL/min/{1.73_m2} (ref >=60)
GFR, Est Non African American: 86 mL/min/{1.73_m2} (ref >=60)
Globulin: 2.8 g/dL (calc) (ref 1.9–3.7)
Glucose, Bld: 92 mg/dL (ref 65–99)
Potassium: 4.2 mmol/L (ref 3.5–5.3)
Sodium: 141 mmol/L (ref 135–146)
Total Bilirubin: 0.3 mg/dL (ref 0.2–1.2)
Total Protein: 6.7 g/dL (ref 6.1–8.1)

## 2019-02-13 LAB — CBC WITH DIFFERENTIAL/PLATELET
Absolute Monocytes: 450 cells/uL (ref 200–950)
Basophils Absolute: 32 cells/uL (ref 0–200)
Basophils Relative: 0.7 %
Eosinophils Absolute: 140 cells/uL (ref 15–500)
Eosinophils Relative: 3.1 %
HCT: 38.2 % (ref 35.0–45.0)
Hemoglobin: 12.8 g/dL (ref 11.7–15.5)
Lymphs Abs: 1139 cells/uL (ref 850–3900)
MCH: 28.9 pg (ref 27.0–33.0)
MCHC: 33.5 g/dL (ref 32.0–36.0)
MCV: 86.2 fL (ref 80.0–100.0)
MPV: 9.9 fL (ref 7.5–12.5)
Monocytes Relative: 10 %
Neutro Abs: 2741 cells/uL (ref 1500–7800)
Neutrophils Relative %: 60.9 %
Platelets: 236 10*3/uL (ref 140–400)
RBC: 4.43 10*6/uL (ref 3.80–5.10)
RDW: 12.1 % (ref 11.0–15.0)
Total Lymphocyte: 25.3 %
WBC: 4.5 10*3/uL (ref 3.8–10.8)

## 2019-02-13 NOTE — Progress Notes (Signed)
Virtual Visit via Video Note  I connected with Brittany Malone on 02/14/19 at  3:00 PM EST by a video enabled telemedicine application and verified that I am speaking with the correct person using two identifiers.  Location: Patient: Home Provider: Clinic  This service was conducted via virtual visit.  Both audio and visual tools were used.  The patient was located at home. I was located in my office.  Consent was obtained prior to the virtual visit and is aware of possible charges through their insurance for this visit.  The patient is an established patient.  Dr. Estanislado Pandy, MD conducted the virtual visit and Hazel Sams, PA-C acted as scribe during the service.  Office staff helped with scheduling follow up visits after the service was conducted.     I discussed the limitations of evaluation and management by telemedicine and the availability of in person appointments. The patient expressed understanding and agreed to proceed.  CC: Morning stiffness  History of Present Illness: Patient is a 53 year old female with past medical history of seropositive rheumatoid arthritis and uveitis. She is taking plaquenil 200 mg 1 tablet by mouth twice daily (started in June 2020).  She denies any recent rheumatoid arthritis flares.  She is not having any increased joint pain or joint swelling.  She has morning stiffness for 20-30 minutes.  She has not had any recent uveitis flares.  She has intermittent SI joint pain depending on her level of activity.   Review of Systems  Constitutional: Negative for fever and malaise/fatigue.  HENT:       +Dry mouth  Eyes: Negative for photophobia, pain, discharge and redness.  Respiratory: Negative for cough, shortness of breath and wheezing.   Cardiovascular: Negative for chest pain and palpitations.  Gastrointestinal: Negative for blood in stool, constipation and diarrhea.  Genitourinary: Negative for dysuria.  Musculoskeletal: Negative for back pain, joint pain,  myalgias and neck pain.       +Morning stiffness   Skin: Negative for rash.  Neurological: Negative for dizziness and headaches.  Psychiatric/Behavioral: Negative for depression. The patient is not nervous/anxious and does not have insomnia.       Observations/Objective: Physical Exam  Constitutional: She is oriented to person, place, and time and well-developed, well-nourished, and in no distress.  HENT:  Head: Normocephalic and atraumatic.  Eyes: Conjunctivae are normal.  Pulmonary/Chest: Effort normal.  Neurological: She is alert and oriented to person, place, and time.  Psychiatric: Mood, memory, affect and judgment normal.    Patient reports morning stiffness for 20-30 minutes.   Patient denies nocturnal pain.  Difficulty dressing/grooming: Denies Difficulty climbing stairs: Denies Difficulty getting out of chair: Denies Difficulty using hands for taps, buttons, cutlery, and/or writing: Denies  Assessment and Plan: Visit Diagnoses: Rheumatoid arthritis involving multiple sites with positive rheumatoid factor (HCC) - Polyarthralgia, positive RF, positive anti-CCP: She has not had any recent rheumatoid arthritis flares.  She is clinically doing well on Plaquenil 200 mg 1 tablet by mouth twice daily.  She is tolerating PLQ without any side effects.  She has no joint pain or inflammation at this time.  She has ongoing morning stiffness for 20-30 minutes daily.  She will continue taking PLQ as prescribed.  She does not need any refills at this time.  She was advised to notify us if she develops increased joint pain or joint swelling. She will follow up in 4 months.   High risk medication use -  Plaquenil 200 mg 1 tablet  twice daily started June 2020.PLQ Eye Exam: 10/12/18 WNL with Pre-existing Toxo Scars @ [facility] Follow up in 6 months   CBC and CMP were drawn on 02/13/19.  She recently had the influenza vaccine, 2nd shingrx vaccine, and pneumonia vaccine.    Chronic uveitis of both eyes - From the age of 53 year old until her 74s -She has had no recurrence. She has not had any signs or symptoms of a uveitis flare.  She sees her ophthalmologist every 6 months.  History of COVID-19 infection-COVID-19 positive on July 20.  She has ongoing fatigue.  She has started to exercise on a regular basis.   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   Chronic SI joint pain-She has intermittent SI joint pain depending on her level of activity.    Other medical conditions are listed as follows:   History of diverticulosis  History of gastroesophageal reflux (GERD)  Family history of systemic lupus erythematosus (SLE) in mother  Other fatigue  History of melanoma  Follow Up Instructions: She will follow up in 4 months.    I discussed the assessment and treatment plan with the patient. The patient was provided an opportunity to ask questions and all were answered. The patient agreed with the plan and demonstrated an understanding of the instructions.   The patient was advised to call back or seek an in-person evaluation if the symptoms worsen or if the condition fails to improve as anticipated.  I provided 15 minutes of non-face-to-face time during this encounter.   Bo Merino, MD   Scribed by-  Hazel Sams, PA-C

## 2019-02-14 ENCOUNTER — Other Ambulatory Visit: Payer: Self-pay

## 2019-02-14 ENCOUNTER — Encounter: Payer: Self-pay | Admitting: Rheumatology

## 2019-02-14 ENCOUNTER — Telehealth (INDEPENDENT_AMBULATORY_CARE_PROVIDER_SITE_OTHER): Payer: BC Managed Care – PPO | Admitting: Rheumatology

## 2019-02-14 DIAGNOSIS — Z79899 Other long term (current) drug therapy: Secondary | ICD-10-CM | POA: Diagnosis not present

## 2019-02-14 DIAGNOSIS — M0579 Rheumatoid arthritis with rheumatoid factor of multiple sites without organ or systems involvement: Secondary | ICD-10-CM

## 2019-02-14 DIAGNOSIS — Z85820 Personal history of malignant melanoma of skin: Secondary | ICD-10-CM

## 2019-02-14 DIAGNOSIS — Q667 Congenital pes cavus, unspecified foot: Secondary | ICD-10-CM

## 2019-02-14 DIAGNOSIS — Z8269 Family history of other diseases of the musculoskeletal system and connective tissue: Secondary | ICD-10-CM

## 2019-02-14 DIAGNOSIS — R5383 Other fatigue: Secondary | ICD-10-CM

## 2019-02-14 DIAGNOSIS — H2013 Chronic iridocyclitis, bilateral: Secondary | ICD-10-CM | POA: Diagnosis not present

## 2019-02-14 DIAGNOSIS — G8929 Other chronic pain: Secondary | ICD-10-CM

## 2019-02-14 DIAGNOSIS — M533 Sacrococcygeal disorders, not elsewhere classified: Secondary | ICD-10-CM

## 2019-02-14 DIAGNOSIS — Z8719 Personal history of other diseases of the digestive system: Secondary | ICD-10-CM

## 2019-03-10 ENCOUNTER — Other Ambulatory Visit: Payer: Self-pay | Admitting: Rheumatology

## 2019-03-10 DIAGNOSIS — M0579 Rheumatoid arthritis with rheumatoid factor of multiple sites without organ or systems involvement: Secondary | ICD-10-CM

## 2019-03-12 NOTE — Telephone Encounter (Signed)
Last Visit: 02/14/2019 telemedicine  Next Visit: 06/19/2019 Labs: 02/13/2019 CBC and CMP WNL Eye exam: 10/12/2018  Okay to refill per Dr. Estanislado Pandy.

## 2019-03-26 ENCOUNTER — Encounter: Payer: Self-pay | Admitting: Family Medicine

## 2019-03-27 MED ORDER — AMITRIPTYLINE HCL 50 MG PO TABS: 50.0000 mg | ORAL_TABLET | Freq: Every evening | ORAL | 1 refills | Status: DC | PRN

## 2019-04-12 DIAGNOSIS — B5801 Toxoplasma chorioretinitis: Secondary | ICD-10-CM | POA: Diagnosis not present

## 2019-04-12 DIAGNOSIS — Z79899 Other long term (current) drug therapy: Secondary | ICD-10-CM | POA: Diagnosis not present

## 2019-04-12 DIAGNOSIS — H31093 Other chorioretinal scars, bilateral: Secondary | ICD-10-CM | POA: Diagnosis not present

## 2019-04-12 DIAGNOSIS — M069 Rheumatoid arthritis, unspecified: Secondary | ICD-10-CM | POA: Diagnosis not present

## 2019-05-15 ENCOUNTER — Other Ambulatory Visit: Payer: Self-pay

## 2019-05-16 ENCOUNTER — Ambulatory Visit (INDEPENDENT_AMBULATORY_CARE_PROVIDER_SITE_OTHER): Payer: BC Managed Care – PPO | Admitting: Family Medicine

## 2019-05-16 ENCOUNTER — Encounter: Payer: Self-pay | Admitting: Gastroenterology

## 2019-05-16 ENCOUNTER — Encounter: Payer: Self-pay | Admitting: Family Medicine

## 2019-05-16 ENCOUNTER — Other Ambulatory Visit: Payer: Self-pay

## 2019-05-16 ENCOUNTER — Other Ambulatory Visit: Payer: Self-pay | Admitting: Family Medicine

## 2019-05-16 VITALS — BP 110/74 | HR 79 | Temp 97.1°F | Ht 67.75 in | Wt 258.4 lb

## 2019-05-16 DIAGNOSIS — M542 Cervicalgia: Secondary | ICD-10-CM | POA: Diagnosis not present

## 2019-05-16 DIAGNOSIS — R7401 Elevation of levels of liver transaminase levels: Secondary | ICD-10-CM

## 2019-05-16 DIAGNOSIS — Z0000 Encounter for general adult medical examination without abnormal findings: Secondary | ICD-10-CM | POA: Diagnosis not present

## 2019-05-16 DIAGNOSIS — M810 Age-related osteoporosis without current pathological fracture: Secondary | ICD-10-CM

## 2019-05-16 DIAGNOSIS — R1319 Other dysphagia: Secondary | ICD-10-CM

## 2019-05-16 DIAGNOSIS — R1310 Dysphagia, unspecified: Secondary | ICD-10-CM | POA: Diagnosis not present

## 2019-05-16 LAB — LIPID PANEL
Cholesterol: 169 mg/dL (ref 0–200)
HDL: 62.4 mg/dL (ref >39.00)
LDL Cholesterol: 81 mg/dL (ref 0–99)
NonHDL: 106.12
Total CHOL/HDL Ratio: 3
Triglycerides: 127 mg/dL (ref 0.0–149.0)
VLDL: 25.4 mg/dL (ref 0.0–40.0)

## 2019-05-16 LAB — COMPREHENSIVE METABOLIC PANEL
ALT: 37 U/L — ABNORMAL HIGH (ref 0–35)
AST: 27 U/L (ref 0–37)
Albumin: 3.8 g/dL (ref 3.5–5.2)
Alkaline Phosphatase: 80 U/L (ref 39–117)
BUN: 16 mg/dL (ref 6–23)
CO2: 29 mEq/L (ref 19–32)
Calcium: 9.1 mg/dL (ref 8.4–10.5)
Chloride: 107 mEq/L (ref 96–112)
Creatinine, Ser: 0.68 mg/dL (ref 0.40–1.20)
GFR: 89.57 mL/min (ref >60.00)
Glucose, Bld: 98 mg/dL (ref 70–99)
Potassium: 4.4 mEq/L (ref 3.5–5.1)
Sodium: 141 mEq/L (ref 135–145)
Total Bilirubin: 0.4 mg/dL (ref 0.2–1.2)
Total Protein: 6.6 g/dL (ref 6.0–8.3)

## 2019-05-16 LAB — LUTEINIZING HORMONE: LH: 30.94 m[IU]/mL

## 2019-05-16 LAB — CBC
HCT: 36.3 % (ref 36.0–46.0)
Hemoglobin: 12.2 g/dL (ref 12.0–15.0)
MCHC: 33.6 g/dL (ref 30.0–36.0)
MCV: 84.7 fl (ref 78.0–100.0)
Platelets: 256 10*3/uL (ref 150.0–400.0)
RBC: 4.29 Mil/uL (ref 3.87–5.11)
RDW: 13.7 % (ref 11.5–15.5)
WBC: 4.5 10*3/uL (ref 4.0–10.5)

## 2019-05-16 LAB — FOLLICLE STIMULATING HORMONE: FSH: 49.7 m[IU]/mL

## 2019-05-16 MED ORDER — CYCLOBENZAPRINE HCL 10 MG PO TABS: 5.0000 mg | ORAL_TABLET | Freq: Three times a day (TID) | ORAL | 0 refills | Status: DC | PRN

## 2019-05-16 MED ORDER — AMITRIPTYLINE HCL 50 MG PO TABS: 50.0000 mg | ORAL_TABLET | Freq: Every evening | ORAL | 2 refills | Status: DC | PRN

## 2019-05-16 NOTE — Progress Notes (Signed)
dx

## 2019-05-16 NOTE — Progress Notes (Signed)
Chief Complaint  Patient presents with  . Annual Exam     Well Woman Brittany Malone is here for a complete physical.   Her last physical was >1 year ago.  Current diet: in general, a "fair" diet. Current exercise: walking. Weight is stable and she denies daytime fatigue. Seatbelt? Yes  Pt slipped and fell on floor at work around 1 mo ago. Having pain in neck and HA's. 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 Had not had issues in neck prior to fall. Still having issues. WC rx'd Robaxin, keeps her awake. Flexeril did better. No weakness.  Over past 2.5 mo, has been having food get stuck in her chest. Sometimes will regurgitate. Started taking omeprazole that did seem to help. Has not had in 2 weeks though.   Mammogram- Yes Colon cancer screening-Yes Shingrix- Yes Tetanus- Yes Hep C screening- Yes HIV screening- Yes  Past Medical History:  Diagnosis Date  . Arthritis   . Insomnia   . Rheumatoid arthritis [facility])      Past Surgical History:  Procedure Laterality Date  . ABDOMINAL HYSTERECTOMY    . ANKLE SURGERY      Medications  Current Outpatient Medications on File Prior to Visit  Medication Sig Dispense Refill  . amitriptyline (ELAVIL) 50 MG tablet Take 1 tablet (50 mg total) by mouth at bedtime as needed for sleep. 30 tablet 1  . cetirizine (ZYRTEC) 10 MG tablet Take 10 mg by mouth as needed for allergies.    Marland Kitchen diclofenac (VOLTAREN) 75 MG EC tablet Take 75 mg by mouth 2 (two) times daily.    . fluticasone (FLONASE) 50 MCG/ACT nasal spray Place into both nostrils as needed for allergies or rhinitis.    . hydroxychloroquine (PLAQUENIL) 200 MG tablet TAKE 1 TABLET BY MOUTH TWICE A DAY 180 tablet 0  . MELATONIN PO Take by mouth as needed.     Allergies Allergies  Allergen Reactions  . Penicillins Anaphylaxis    Review of Systems: Constitutional:  no unexpected weight changes Eye:  no recent significant change in vision Ear/Nose/Mouth/Throat:   Ears:  no recent change in hearing Nose/Mouth/Throat:  no complaints of nasal congestion, no sore throat Cardiovascular: no chest pain Respiratory:  no shortness of breath Gastrointestinal:  no abdominal pain, no change in bowel habits GU:  Female: negative for dysuria or pelvic pain Musculoskeletal/Extremities: +chronic hand pain, +neck pain; otherwise no pain of the joints Integumentary (Skin/Breast):  no abnormal skin lesions reported Neurologic:  no weakness Endocrine:  denies fatigue Hematologic/Lymphatic:  No areas of easy bleeding  Exam BP 110/74 (BP Location: Left Arm, Patient Position: Sitting, Cuff Size: Large)   Pulse 79   Temp (!) 97.1 F (36.2 C) (Temporal)   Ht 5' 7.75" (1.721 m)   Wt 258 lb 6 oz (117.2 kg)   SpO2 97%   BMI 39.58 kg/m  General:  well developed, well nourished, in no apparent distress Skin:  no significant moles, warts, or growths Head:  no masses, lesions, or tenderness Eyes:  pupils equal and round, sclera anicteric without injection Ears:  canals without lesions, TMs shiny without retraction, no obvious effusion, no erythema Nose:  nares patent, septum midline, mucosa normal, and no drainage or sinus tenderness Throat/Pharynx:  lips and gingiva without lesion; tongue and uvula midline; non-inflamed pharynx; no exudates or postnasal drainage Neck: neck supple without adenopathy, thyromegaly, or masses Lungs:  clear to auscultation, breath sounds equal bilaterally, no respiratory distress  Cardio:  regular rate and rhythm, no LE edema Abdomen:  abdomen soft, nontender; bowel sounds normal; no masses or organomegaly Genital: Defer to GYN Musculoskeletal:  symmetrical muscle groups noted without atrophy or deformity Extremities:  no clubbing, cyanosis, or edema, no deformities, no skin discoloration Neuro:  gait normal; deep tendon reflexes normal and symmetric Psych: well oriented with normal range of affect and appropriate  judgment/insight  Assessment and Plan  Well adult exam - Plan: CBC, Comprehensive metabolic panel, Lipid panel, FSH, LH  Neck pain  Esophageal dysphagia - Plan: Ambulatory referral to Gastroenterology   Well 54 y.o. female. Counseled on diet and exercise. Ck labs. If levels suggestive of menopause, will order DEXA due to famhx of osteoporosis and estrogen def.  Flexeril for neck pain. She does not get drowsy with this. Will refer to GI for possible scope given dysphagia.  Other orders as above. Follow up in 6 mo or prn. The patient voiced understanding and agreement to the plan.  [place], DO 05/16/19 8:18 AM

## 2019-05-16 NOTE — Patient Instructions (Addendum)
Give [place] 2-3 business days to get the results of your labs back.   Keep the diet clean and stay active.  Ok to trial Symbicort twice daily. Remember to rinse your mouth out after each use.   [name] has to massage your shoulders and neck as needed.   If you do not hear anything about your referral in the next 1-2 weeks, call our office and ask for an update.  Let us know if you need anything.

## 2019-05-21 ENCOUNTER — Ambulatory Visit (HOSPITAL_BASED_OUTPATIENT_CLINIC_OR_DEPARTMENT_OTHER)
Admission: RE | Admit: 2019-05-21 | Discharge: 2019-05-21 | Disposition: A | Discharge status: Home / Self Care | Payer: BC Managed Care – PPO | Source: Ambulatory Visit | Attending: Family Medicine | Admitting: Family Medicine

## 2019-05-21 ENCOUNTER — Other Ambulatory Visit: Payer: Self-pay

## 2019-05-21 DIAGNOSIS — Z780 Asymptomatic menopausal state: Secondary | ICD-10-CM | POA: Diagnosis not present

## 2019-05-21 DIAGNOSIS — M810 Age-related osteoporosis without current pathological fracture: Secondary | ICD-10-CM

## 2019-05-21 DIAGNOSIS — E2839 Other primary ovarian failure: Secondary | ICD-10-CM | POA: Diagnosis not present

## 2019-05-21 DIAGNOSIS — Z13820 Encounter for screening for osteoporosis: Secondary | ICD-10-CM | POA: Diagnosis not present

## 2019-06-11 ENCOUNTER — Other Ambulatory Visit: Payer: Self-pay | Admitting: Rheumatology

## 2019-06-11 DIAGNOSIS — M0579 Rheumatoid arthritis with rheumatoid factor of multiple sites without organ or systems involvement: Secondary | ICD-10-CM

## 2019-06-11 NOTE — Progress Notes (Signed)
Office Visit Note  Patient: Brittany Malone             Date of Birth: 1966-01-30           MRN: [id number]             PCP: Brittany Pal, DO Referring: Brittany Malone* Visit Date: 06/19/2019 Occupation: @GUAROCC @  Subjective:  Medication management   History of Present Illness: Brittany Malone is a 54 y.o. female with history of rheumatoid arthritis and osteoarthritis.  She states she has been doing well as regards to her rheumatoid arthritis on Plaquenil without any increased joint swelling.  She states in mid February she fell and hurt her head and neck.  She had a CT scan which showed degenerative changes.  She is going to physical therapy.  She also has an appointment coming up with the orthopedic surgeon.  Activities of Daily Living:  Patient reports morning stiffness for 30 minutes.   Patient Denies nocturnal pain.  Difficulty dressing/grooming: Denies Difficulty climbing stairs: Denies Difficulty getting out of chair: Denies Difficulty using hands for taps, buttons, cutlery, and/or writing: Denies  Review of Systems  Constitutional: Positive for fatigue. Negative for night sweats, weight gain and weight loss.  HENT: Positive for mouth dryness. Negative for mouth sores, trouble swallowing, trouble swallowing and nose dryness.   Eyes: Negative for pain, redness, visual disturbance and dryness.  Respiratory: Negative for cough, shortness of breath and difficulty breathing.   Cardiovascular: Negative for chest pain, palpitations, hypertension, irregular heartbeat and swelling in legs/feet.  Gastrointestinal: Negative for blood in stool, constipation and diarrhea.  Endocrine: Negative for increased urination.  Genitourinary: Negative for difficulty urinating and vaginal dryness.  Musculoskeletal: Positive for arthralgias, joint pain, morning stiffness and muscle tenderness. Negative for joint swelling, myalgias, muscle weakness and myalgias.  Skin: Negative  for color change, rash, hair loss, skin tightness, ulcers and sensitivity to sunlight.  Allergic/Immunologic: Negative for susceptible to infections.  Neurological: Negative for dizziness, numbness, memory loss, night sweats and weakness.  Hematological: Negative for bruising/bleeding tendency and swollen glands.  Psychiatric/Behavioral: Positive for sleep disturbance. Negative for depressed mood. The patient is not nervous/anxious.     PMFS History:  Patient Active Problem List   Diagnosis Date Noted  . Rheumatoid arthritis involving multiple sites with positive rheumatoid factor ([place]) 08/15/2018  . Family history of rheumatoid arthritis 08/15/2018  . Chronic uveitis of both eyes 08/15/2018  . Pes cavus 08/15/2018  . History of gastroesophageal reflux (GERD) 08/15/2018    Past Medical History:  Diagnosis Date  . Allergy    SEASONAL  . Anemia   . Arthritis   . GERD (gastroesophageal reflux disease)   . Insomnia   . Rheumatoid arthritis ([place])     Family History  Problem Relation Age of Onset  . Lupus Mother   . Fibromyalgia Mother   . Hyperlipidemia Mother   . Hypertension Father   . High Cholesterol Father   . Hyperlipidemia Father   . Colon polyps Father   . Kidney Stones Brother   . Healthy Son   . Hypertension Daughter   . Migraines Daughter   . Colon cancer Neg Hx   . Esophageal cancer Neg Hx   . Rectal cancer Neg Hx   . Stomach cancer Neg Hx    Past Surgical History:  Procedure Laterality Date  . ABDOMINAL HYSTERECTOMY    . ANKLE SURGERY     Social History   Social History Narrative  .  Not on file   Immunization History  Administered Date(s) Administered  . Influenza,inj,Quad PF,6+ Mos 11/16/2018  . Moderna SARS-COVID-2 Vaccination 03/08/2019, 04/05/2019  . Pneumococcal Polysaccharide-23 11/16/2018  . Tdap 05/16/2010  . Zoster Recombinat (Shingrix) 11/16/2018, 01/16/2019     Objective: Vital Signs: BP 130/85 (BP Location: Left Arm, Patient  Position: Sitting, Cuff Size: Normal)   Pulse 89   Resp 16   Ht 5' 7.75" (1.721 m)   Wt 256 lb 3.2 oz (116.2 kg)   BMI 39.24 kg/m    Physical Exam Vitals and nursing note reviewed.  Constitutional:      Appearance: She is well-developed.  HENT:     Head: Normocephalic and atraumatic.  Eyes:     Conjunctiva/sclera: Conjunctivae normal.  Cardiovascular:     Rate and Rhythm: Normal rate and regular rhythm.     Heart sounds: Normal heart sounds.  Pulmonary:     Effort: Pulmonary effort is normal.     Breath sounds: Normal breath sounds.  Abdominal:     General: Bowel sounds are normal.     Palpations: Abdomen is soft.  Musculoskeletal:     Cervical back: Normal range of motion.  Lymphadenopathy:     Cervical: No cervical adenopathy.  Skin:    General: Skin is warm and dry.     Capillary Refill: Capillary refill takes less than 2 seconds.  Neurological:     Mental Status: She is alert and oriented to person, place, and time.  Psychiatric:        Behavior: Behavior normal.      Musculoskeletal Exam: Patient had painful limited range of motion of her cervical spine.  Shoulder joints, elbow joints, wrist joints, MCPs and PIPs and DIPs with good range of motion with no synovitis.  Hip joints, knee joints, ankles, MTPs and PIPs in good range of motion with no synovitis.  CDAI Exam: CDAI Score: 0.2  Patient Global: 1 mm; Provider Global: 1 mm Swollen: 0 ; Tender: 0  Joint Exam 06/19/2019   No joint exam has been documented for this visit   There is currently no information documented on the homunculus. Go to the Rheumatology activity and complete the homunculus joint exam.  Investigation: No additional findings.  Imaging: DG Bone Density  Result Date: 05/21/2019 EXAM: DUAL X-RAY ABSORPTIOMETRY (DXA) FOR BONE MINERAL DENSITY IMPRESSION: [name] Your patient Brittany Malone completed a BMD test on 05/21/2019 using the [place] (analysis version:  16.SP2) manufactured by [employer]. The following summarizes the results of our evaluation. [place] PATIENT: Name: Brittany, Malone Patient ID: [id number] Birth Date: 1965-04-27 Height: 68.0 in. Gender: Female Measured: 05/21/2019 Weight: 258.2 lbs. Indications: Caucasian, Estrogen Deficiency, Hysterectomy, Low Calcium Intake, Post Menopausal, Rheumatoid Arthritis Fractures: Treatments: Plaquanil ASSESSMENT: The BMD measured at AP Spine L1-L2 is 1.168 g/cm2 with a T-score of 0.0. This patient is considered normal according to [facility]) criteria. L4 & 5- was excluded due to degenerative changes. The scan quality is good. Site Region Measured Date Measured Age WHO YA BMD Classification T-score AP Spine L1-L2 05/21/2019 55.8 Normal 0.0 1.168 g/cm2 DualFemur Neck Left 05/21/2019 55.8 years Normal 0.6 1.128 g/cm2 World Health Organization [facility]) criteria for post-menopausal, Caucasian Women: Normal        T-score at or above -1 SD Low Bone Mass T-score between -1 and -2.5 SD Osteoporosis  T-score at or below -2.5 SD RECOMMENDATION:1. All patients should optimize calcium and vitamin D intake. 2. Consider FDA-approved medical  therapies in postmenopausal women and men aged 110 years and older, based on the following: a. A hip or vertebral(clinical or morphometric) fracture. b. T-Score < -2.5 at the femoral neck or spine after appropriate evaluation to exclude secondary causes c. Low bone mass (T-score between -1.0 and -2.5 at the femoral neck or spine) and a 10 year probability of a hip fracture >3% or a 10 year probability of major osteoporosis-related fracture > 20% based on the [place]-adapted WHO algorithm d. Clinical judgement and/or patient preferences may indicate treatment for people with 10-year fracture probabilities above or below these levels FOLLOW-UP: Patients with diagnosis of osteoporosis or at high risk for fracture should have regular bone mineral density tests. For patients eligible for  Medicare, routine testing is allowed once every 2 years. The testing frequency can be increased to one year for patients who have rapidly progressing disease, those who are receiving or discontinuing medical therapy to restore bone mass, or have additional risk factors. I have reviewed this report and agree with the above findings. [facility] Radiology Electronically Signed   By: Lowella Grip III M.D.   On: 05/21/2019 10:34    Recent Labs: Lab Results  Component Value Date   WBC 4.5 05/16/2019   HGB 12.2 05/16/2019   PLT 256.0 05/16/2019   NA 141 05/16/2019   K 4.4 05/16/2019   CL 107 05/16/2019   CO2 29 05/16/2019   GLUCOSE 98 05/16/2019   BUN 16 05/16/2019   CREATININE 0.68 05/16/2019   BILITOT 0.4 05/16/2019   ALKPHOS 80 05/16/2019   AST 27 05/16/2019   ALT 37 (H) 05/16/2019   PROT 6.6 05/16/2019   ALBUMIN 3.8 05/16/2019   CALCIUM 9.1 05/16/2019   GFRAA 99 02/13/2019    Speciality Comments: PLQ Eye Exam: 10/12/18 WNL with Pre-existing Toxo Scars @ [facility] Follow up  in 6 months  Procedures:  No procedures performed Allergies: Penicillins   Assessment / Plan:     Visit Diagnoses:   Rheumatoid arthritis of multiple sites with positive rheumatoid factor-patient has been doing well on Plaquenil.  She denies any joint swelling.  She has some joint stiffness occasionally.  High risk medication use-she is on Plaquenil 200 mg p.o. twice daily.  Her last eye exam per patient was in February 2021.  Her most recent labs from March were normal except for mild elevation of LFTs.  She states her PCP will be repeating her labs in June.  Neck pain-she has been having increased neck pain.  She states she had recent fall at her work.  She is seeing orthopedic surgeon through [employer]  She also mentioned that she has degenerative changes in her cervical spine based on the CT scan of her cervical spine.  Chronic SI joint pain-she has off-and-on discomfort.  Pes  cavus  Chronic uveitis of both eyes-she has had no recurrence of symptoms since her 54s.  Her symptoms are started at age 25.  History of gastroesophageal reflux-patient had recent EGD which showed esophagitis per patient.  History of diverticulosis  History of melanoma  Recent fall-patient slipped on a wet floor at work.  Family history of systemic lupus erythematosus-mother    Orders: No orders of the defined types were placed in this encounter.  No orders of the defined types were placed in this encounter.    Follow-Up Instructions: Return in about 5 months (around 11/19/2019) for Rheumatoid arthritis.   Bo Merino, MD  Note - This record has been created  using Editor, commissioning.  Chart creation errors have been sought, but may not always  have been located. Such creation errors do not reflect on  the standard of medical care.

## 2019-06-11 NOTE — Telephone Encounter (Addendum)
Last Visit: 02/14/2019 telemedicine  Next Visit: 06/19/2019 Labs: 02/13/2019 CBC and CMP WNL Eye exam: 10/12/2018  Current Dose per office note on 02/14/19: Plaquenil 200 mg 1 tablet twice daily   Okay to refill per Dr. Estanislado Pandy.

## 2019-06-12 ENCOUNTER — Other Ambulatory Visit: Payer: Self-pay

## 2019-06-12 ENCOUNTER — Encounter: Payer: Self-pay | Admitting: Gastroenterology

## 2019-06-12 ENCOUNTER — Ambulatory Visit (INDEPENDENT_AMBULATORY_CARE_PROVIDER_SITE_OTHER): Payer: BC Managed Care – PPO | Admitting: Gastroenterology

## 2019-06-12 VITALS — BP 132/70 | HR 108 | Temp 98.3°F | Ht 67.75 in | Wt 255.0 lb

## 2019-06-12 DIAGNOSIS — R1310 Dysphagia, unspecified: Secondary | ICD-10-CM | POA: Diagnosis not present

## 2019-06-12 NOTE — Progress Notes (Signed)
Chief Complaint: Dysphagia  Referring Provider:     Shelda Pal, DO   HPI:    Brittany Malone is a 53 y.o. female referred to the [facility]astro[facility]ology Clinic for evaluation of solid food dysphagia.  Symptoms developed approximately 4 months ago described as food getting stuck pointing to her mid sternum. Will have relief with emesis. Sometimes feels like a "spasm" type pain.  Spasm is not occur independently.  Started omeprazole OTC with some improvement. No prior similar sxs. Does report a hx of intermittent reflux sxs, but no prior need for chronic aspiration therapy.  Otherwise, no weight loss, night sweats, fever, chills, nausea, vomiting, medic easier, melena.   Endoscopic Hx: - EGD (2013 for anemia): normal per patient - Colonoscopy (2013 for anemia): diverticulosis, o/w normal per patient  No known family history of CRC, GI malignancy, liver disease, pancreatic disease, or IBD.    Past Medical History:  Diagnosis Date  . Arthritis   . Insomnia   . Rheumatoid arthritis [facility])      Past Surgical History:  Procedure Laterality Date  . ABDOMINAL HYSTERECTOMY    . ANKLE SURGERY     Family History  Problem Relation Age of Onset  . Lupus Mother   . Fibromyalgia Mother   . Hyperlipidemia Mother   . Hypertension Father   . High Cholesterol Father   . Hyperlipidemia Father   . Kidney Stones Brother   . Healthy Son   . Hypertension Daughter   . Migraines Daughter   . Colon cancer Neg Hx    Social History   Tobacco Use  . Smoking status: Never Smoker  . Smokeless tobacco: Never Used  Substance Use Topics  . Alcohol use: Yes    Comment: occ  . Drug use: Never   Current Outpatient Medications  Medication Sig Dispense Refill  . amitriptyline (ELAVIL) 50 MG tablet Take 1 tablet (50 mg total) by mouth at bedtime as needed for sleep. 90 tablet 2  . cetirizine (ZYRTEC) 10 MG tablet Take 10 mg by mouth as needed for allergies.    . fluticasone  (FLONASE) 50 MCG/ACT nasal spray Place into both nostrils as needed for allergies or rhinitis.    . hydroxychloroquine (PLAQUENIL) 200 MG tablet TAKE 1 TABLET BY MOUTH TWICE A DAY 180 tablet 0  . MELATONIN PO Take by mouth as needed.    . methylPREDNISolone (MEDROL DOSEPAK) 4 MG TBPK tablet Take by mouth.     No current facility-administered medications for this visit.   Allergies  Allergen Reactions  . Penicillins Anaphylaxis     Review of Systems: All systems reviewed and negative except where noted in HPI.     Physical Exam:    Wt Readings from Last 3 Encounters:  06/12/19 255 lb (115.7 kg)  05/16/19 258 lb 6 oz (117.2 kg)  11/16/18 263 lb 2 oz (119.4 kg)    BP 132/70   Pulse (!) 108   Temp 98.3 F (36.8 C)   Ht 5' 7.75" (1.721 m)   Wt 255 lb (115.7 kg)   BMI 39.06 kg/m  Constitutional:  Pleasant, in no acute distress. Psychiatric: Normal mood and affect. Behavior is normal. EENT: Pupils normal.  Conjunctivae are normal. No scleral icterus. Neck supple. No cervical LAD. Cardiovascular: Normal rate, regular rhythm. No edema Pulmonary/chest: Effort normal and breath sounds normal. No wheezing, rales or rhonchi. Abdominal: Soft, nondistended, nontender. Bowel sounds active throughout.  There are no masses palpable. No hepatomegaly. Neurological: Alert and oriented to person place and time. Skin: Skin is warm and dry. No rashes noted.   ASSESSMENT AND PLAN;   1) Dysphagia 2) Esophageal spasm  Relatively recent onset solid food dysphagia and esophageal spasm type pain.  -EGD with dilation and esophageal biopsies as appropriate -If EGD unrevealing and no response to dilation, plan for EM given component of spasm  The indications, risks, and benefits of EGD with dilation were explained to the patient in detail. Risks include but are not limited to bleeding, perforation, adverse reaction to medications, and cardiopulmonary compromise. Sequelae include but are not  limited to the possibility of surgery, hositalization, and mortality. The patient verbalized understanding and wished to proceed. All questions answered, referred to scheduler. Further recommendations pending results of the exam.      [name], DO, FACG  06/12/2019, 3:35 PM   [name], [place]*

## 2019-06-12 NOTE — Patient Instructions (Signed)
You have been scheduled for an endoscopy. Please follow written instructions given to you at your visit today. If you use inhalers (even only as needed), please bring them with you on the day of your procedure. Your physician has requested that you go to www.startemmi.com and enter the access code given to you at your visit today. This web site gives a general overview about your procedure. However, you should still follow specific instructions given to you by our office regarding your preparation for the procedure.  It was a pleasure to see you today!  Vito Cirigliano, D.O.  

## 2019-06-18 ENCOUNTER — Other Ambulatory Visit: Payer: Self-pay

## 2019-06-18 ENCOUNTER — Ambulatory Visit (AMBULATORY_SURGERY_CENTER): Payer: BC Managed Care – PPO | Admitting: Gastroenterology

## 2019-06-18 ENCOUNTER — Encounter: Payer: Self-pay | Admitting: Gastroenterology

## 2019-06-18 VITALS — BP 128/68 | HR 69 | Temp 98.0°F | Resp 15 | Ht 67.0 in | Wt 255.0 lb

## 2019-06-18 DIAGNOSIS — K2950 Unspecified chronic gastritis without bleeding: Secondary | ICD-10-CM | POA: Diagnosis not present

## 2019-06-18 DIAGNOSIS — K222 Esophageal obstruction: Secondary | ICD-10-CM

## 2019-06-18 DIAGNOSIS — K208 Other esophagitis without bleeding: Secondary | ICD-10-CM | POA: Diagnosis not present

## 2019-06-18 DIAGNOSIS — K2100 Gastro-esophageal reflux disease with esophagitis, without bleeding: Secondary | ICD-10-CM | POA: Diagnosis not present

## 2019-06-18 DIAGNOSIS — R1310 Dysphagia, unspecified: Secondary | ICD-10-CM

## 2019-06-18 MED ORDER — PANTOPRAZOLE SODIUM 40 MG PO TBEC: 40.0000 mg | DELAYED_RELEASE_TABLET | Freq: Two times a day (BID) | ORAL | 3 refills | Status: DC

## 2019-06-18 MED ORDER — SODIUM CHLORIDE 0.9 % IV SOLN: 500.0000 mL | Freq: Once | INTRAVENOUS | Status: DC

## 2019-06-18 NOTE — Progress Notes (Signed)
Called to room to assist during endoscopic procedure.  Patient ID and intended procedure confirmed with present staff. Received instructions for my participation in the procedure from the performing physician.  

## 2019-06-18 NOTE — Patient Instructions (Signed)
Await pathology results.  Soft diet today.  Resume regular diet tomorrow.  Use Protonix 40mg  twice a day for 8 weeks then reduce to 40 mg once a day.  Pick up RX at pharmacy.  Repeat endoscopy in 6-8 weeks.  YOU HAD AN ENDOSCOPIC PROCEDURE TODAY AT [facility]:   Refer to the procedure report that was given to you for any specific questions about what was found during the examination.  If the procedure report does not answer your questions, please call your gastroenterologist to clarify.  If you requested that your care partner not be given the details of your procedure findings, then the procedure report has been included in a sealed envelope for you to review at your convenience later.  YOU SHOULD EXPECT: Some feelings of bloating in the abdomen. Passage of more gas than usual.  Walking can help get rid of the air that was put into your GI tract during the procedure and reduce the bloating. If you had a lower endoscopy (such as a colonoscopy or flexible sigmoidoscopy) you may notice spotting of blood in your stool or on the toilet paper. If you underwent a bowel prep for your procedure, you may not have a normal bowel movement for a few days.  Please Note:  You might notice some irritation and congestion in your nose or some drainage.  This is from the oxygen used during your procedure.  There is no need for concern and it should clear up in a day or so.  SYMPTOMS TO REPORT IMMEDIATELY:    Following upper endoscopy (EGD)  Vomiting of blood or coffee ground material  New chest pain or pain under the shoulder blades  Painful or persistently difficult swallowing  New shortness of breath  Fever of 100F or higher  Black, tarry-looking stools  For urgent or emergent issues, a gastroenterologist can be reached at any hour by calling [phone]. Do not use MyChart messaging for urgent concerns.    DIET:  We do recommend a small meal at first, but then you may proceed to  your regular diet.  Drink plenty of fluids but you should avoid alcoholic beverages for 24 hours.  ACTIVITY:  You should plan to take it easy for the rest of today and you should NOT DRIVE or use heavy machinery until tomorrow (because of the sedation medicines used during the test).    FOLLOW UP: Our staff will call the number listed on your records 48-72 hours following your procedure to check on you and address any questions or concerns that you may have regarding the information given to you following your procedure. If we do not reach you, we will leave a message.  We will attempt to reach you two times.  During this call, we will ask if you have developed any symptoms of COVID 19. If you develop any symptoms (ie: fever, flu-like symptoms, shortness of breath, cough etc.) before then, please call [phone].  If you test positive for Covid 19 in the 2 weeks post procedure, please call and report this information to [place].    If any biopsies were taken you will be contacted by phone or by letter within the next 1-3 weeks.  Please call us at [phone] if you have not heard about the biopsies in 3 weeks.    SIGNATURES/CONFIDENTIALITY: You and/or your care partner have signed paperwork which will be entered into your electronic medical record.  These signatures attest to the fact that that  the information above on your After Visit Summary has been reviewed and is understood.  Full responsibility of the confidentiality of this discharge information lies with you and/or your care-partner.

## 2019-06-18 NOTE — Op Note (Signed)
[place] Patient Name: Brittany Malone Procedure Date: 06/18/2019 3:26 PM MRN: [id number] Endoscopist: Gerrit Heck , MD Age: 54 Referring MD:  Date of Birth: 12-25-1965 Gender: Female Account #: [id number] Procedure:                Upper GI endoscopy Indications:              Dysphagia, Suspected esophageal reflux, Esophageal                            spasm type pain Medicines:                Monitored Anesthesia Care Procedure:                Pre-Anesthesia Assessment:                           - Prior to the procedure, a History and Physical                            was performed, and patient medications and                            allergies were reviewed. The patient's tolerance of                            previous anesthesia was also reviewed. The risks                            and benefits of the procedure and the sedation                            options and risks were discussed with the patient.                            All questions were answered, and informed consent                            was obtained. Prior Anticoagulants: The patient has                            taken no previous anticoagulant or antiplatelet                            agents. ASA Grade Assessment: II - A patient with                            mild systemic disease. After reviewing the risks                            and benefits, the patient was deemed in                            satisfactory condition to undergo the procedure.  After obtaining informed consent, the endoscope was                            passed under direct vision. Throughout the                            procedure, the patient's blood pressure, pulse, and                            oxygen saturations were monitored continuously. The                            Endoscope was introduced through the mouth, and                            advanced to the second part of  duodenum. The upper                            GI endoscopy was accomplished without difficulty.                            The patient tolerated the procedure well. Scope In: Scope Out: Findings:                 LA Grade C (one or more mucosal breaks continuous                            between tops of 2 or more mucosal folds, less than                            75% circumference) esophagitis with no bleeding was                            found in the lower third of the esophagus. Biopsies                            were taken with a cold forceps for histology.                            Estimated blood loss was minimal.                           One benign-appearing, intrinsic mild stenosis was                            found 36 cm from the incisors. This stenosis                            measured 1 cm (in length). The stenosis was                            traversed. A TTS dilator was passed through the  scope. Dilation with a 16-17-18 mm balloon (no                            mucosal rent) then a 18-19-20 mm balloon dilator                            was performed to 20 mm. The dilation site was                            examined and showed mild mucosal disruption                            consistent with successful dilation. This was then                            biopsied with a cold forceps for additional                            fracturing of the ring. Estimated blood loss was                            minimal.                           The upper third of the esophagus and middle third                            of the esophagus were normal.                           The gastroesophageal flap valve was visualized                            endoscopically and classified as Hill Grade II                            (fold present, opens with respiration).                           The gastric fundus, gastric body, incisura, gastric                             antrum and pylorus were normal.                           The duodenal bulb, first portion of the duodenum                            and second portion of the duodenum were normal. Complications:            No immediate complications. Estimated Blood Loss:     Estimated blood loss was minimal. Impression:               - LA Grade C reflux esophagitis with no bleeding.  Biopsied.                           - Benign-appearing esophageal stenosis. Dilated                            with a 20 mm TTS balloon then the ring was further                            fractured using cold forceps.                           - Normal upper third of esophagus and middle third                            of esophagus.                           - Gastroesophageal flap valve classified as Hill                            Grade II (fold present, opens with respiration).                           - Normal gastric fundus, gastric body, incisura,                            antrum and pylorus.                           - Normal duodenal bulb, first portion of the                            duodenum and second portion of the duodenum. Recommendation:           - Patient has a contact number available for                            emergencies. The signs and symptoms of potential                            delayed complications were discussed with the                            patient. Return to normal activities tomorrow.                            Written discharge instructions were provided to the                            patient.                           - Soft diet today.                           -  Continue present medications.                           - Await pathology results.                           - Use Protonix (pantoprazole) 40 mg PO BID for 8                            weeks, then reduce to 40 mg/day and will eventually                            titrate  to lowest effective dose to control                            UGI/reflux symptoms.                           - Repeat upper endoscopy in 6-8 weeks to check                            healing and for retreatment. Gerrit Heck, MD 06/18/2019 3:52:05 PM

## 2019-06-18 NOTE — Progress Notes (Signed)
Report to PACU, RN, vss, BBS= Clear.  

## 2019-06-19 ENCOUNTER — Ambulatory Visit (INDEPENDENT_AMBULATORY_CARE_PROVIDER_SITE_OTHER): Payer: BC Managed Care – PPO | Admitting: Rheumatology

## 2019-06-19 ENCOUNTER — Encounter: Payer: Self-pay | Admitting: Physician Assistant

## 2019-06-19 VITALS — BP 130/85 | HR 89 | Resp 16 | Ht 67.75 in | Wt 256.2 lb

## 2019-06-19 DIAGNOSIS — Z85820 Personal history of malignant melanoma of skin: Secondary | ICD-10-CM

## 2019-06-19 DIAGNOSIS — M533 Sacrococcygeal disorders, not elsewhere classified: Secondary | ICD-10-CM

## 2019-06-19 DIAGNOSIS — Z9181 History of falling: Secondary | ICD-10-CM

## 2019-06-19 DIAGNOSIS — Q667 Congenital pes cavus, unspecified foot: Secondary | ICD-10-CM

## 2019-06-19 DIAGNOSIS — Z8719 Personal history of other diseases of the digestive system: Secondary | ICD-10-CM

## 2019-06-19 DIAGNOSIS — R5383 Other fatigue: Secondary | ICD-10-CM

## 2019-06-19 DIAGNOSIS — M542 Cervicalgia: Secondary | ICD-10-CM

## 2019-06-19 DIAGNOSIS — Z79899 Other long term (current) drug therapy: Secondary | ICD-10-CM | POA: Diagnosis not present

## 2019-06-19 DIAGNOSIS — H2013 Chronic iridocyclitis, bilateral: Secondary | ICD-10-CM | POA: Diagnosis not present

## 2019-06-19 DIAGNOSIS — M0579 Rheumatoid arthritis with rheumatoid factor of multiple sites without organ or systems involvement: Secondary | ICD-10-CM

## 2019-06-19 DIAGNOSIS — G8929 Other chronic pain: Secondary | ICD-10-CM

## 2019-06-19 DIAGNOSIS — Z8269 Family history of other diseases of the musculoskeletal system and connective tissue: Secondary | ICD-10-CM

## 2019-06-20 ENCOUNTER — Telehealth: Payer: Self-pay | Admitting: *Deleted

## 2019-06-20 NOTE — Telephone Encounter (Signed)
  Follow up Call-  Call back number 06/18/2019  Post procedure Call Back phone  # [phone]  Permission to leave phone message Yes  Some recent data might be hidden     Patient questions:  Do you have a fever, pain , or abdominal swelling? No. Pain Score  0 *  Have you tolerated food without any problems? Yes.    Have you been able to return to your normal activities? Yes.    Do you have any questions about your discharge instructions: Diet   No. Medications  No. Follow up visit  No.  Do you have questions or concerns about your Care? No.  Actions: * If pain score is 4 or above: No action needed, pain <4.   1. Have you developed a fever since your procedure? no  2.   Have you had an respiratory symptoms (SOB or cough) since your procedure? no  3.   Have you tested positive for COVID 19 since your procedure no   4.   Have you had any family members/close contacts diagnosed with the COVID 19 since your procedure?  no   If yes to any of these questions please route to Joylene John, RN and Erenest Rasher, RN

## 2019-06-22 ENCOUNTER — Encounter: Payer: Self-pay | Admitting: Gastroenterology

## 2019-08-07 ENCOUNTER — Encounter: Payer: BC Managed Care – PPO | Admitting: Gastroenterology

## 2019-08-10 ENCOUNTER — Encounter: Payer: BC Managed Care – PPO | Admitting: Gastroenterology

## 2019-08-16 ENCOUNTER — Other Ambulatory Visit: Payer: Self-pay

## 2019-08-16 ENCOUNTER — Other Ambulatory Visit (INDEPENDENT_AMBULATORY_CARE_PROVIDER_SITE_OTHER): Payer: BC Managed Care – PPO

## 2019-08-16 DIAGNOSIS — R7401 Elevation of levels of liver transaminase levels: Secondary | ICD-10-CM | POA: Diagnosis not present

## 2019-08-16 LAB — HEPATIC FUNCTION PANEL
ALT: 23 U/L (ref 0–35)
AST: 16 U/L (ref 0–37)
Albumin: 4.2 g/dL (ref 3.5–5.2)
Alkaline Phosphatase: 68 U/L (ref 39–117)
Bilirubin, Direct: 0.1 mg/dL (ref 0.0–0.3)
Total Bilirubin: 0.4 mg/dL (ref 0.2–1.2)
Total Protein: 6.9 g/dL (ref 6.0–8.3)

## 2019-08-16 LAB — IBC + FERRITIN
Ferritin: 7 ng/mL — ABNORMAL LOW (ref 10.0–291.0)
Iron: 40 ug/dL — ABNORMAL LOW (ref 42–145)
Saturation Ratios: 8.2 % — ABNORMAL LOW (ref 20.0–50.0)
Transferrin: 347 mg/dL (ref 212.0–360.0)

## 2019-08-17 LAB — HEPATITIS B SURFACE ANTIGEN: Hepatitis B Surface Ag: NONREACTIVE

## 2019-08-21 ENCOUNTER — Other Ambulatory Visit: Payer: Self-pay

## 2019-08-21 ENCOUNTER — Ambulatory Visit (AMBULATORY_SURGERY_CENTER): Payer: BC Managed Care – PPO | Admitting: Gastroenterology

## 2019-08-21 ENCOUNTER — Encounter: Payer: Self-pay | Admitting: Gastroenterology

## 2019-08-21 VITALS — BP 127/86 | HR 62 | Temp 97.3°F | Resp 11 | Ht 67.0 in | Wt 255.0 lb

## 2019-08-21 DIAGNOSIS — K319 Disease of stomach and duodenum, unspecified: Secondary | ICD-10-CM | POA: Diagnosis not present

## 2019-08-21 DIAGNOSIS — K2100 Gastro-esophageal reflux disease with esophagitis, without bleeding: Secondary | ICD-10-CM

## 2019-08-21 DIAGNOSIS — K317 Polyp of stomach and duodenum: Secondary | ICD-10-CM | POA: Diagnosis not present

## 2019-08-21 DIAGNOSIS — K3189 Other diseases of stomach and duodenum: Secondary | ICD-10-CM | POA: Diagnosis not present

## 2019-08-21 DIAGNOSIS — K228 Other specified diseases of esophagus: Secondary | ICD-10-CM | POA: Diagnosis not present

## 2019-08-21 DIAGNOSIS — K2970 Gastritis, unspecified, without bleeding: Secondary | ICD-10-CM | POA: Diagnosis not present

## 2019-08-21 DIAGNOSIS — K219 Gastro-esophageal reflux disease without esophagitis: Secondary | ICD-10-CM | POA: Diagnosis not present

## 2019-08-21 DIAGNOSIS — R1310 Dysphagia, unspecified: Secondary | ICD-10-CM

## 2019-08-21 MED ORDER — SODIUM CHLORIDE 0.9 % IV SOLN: 500.0000 mL | Freq: Once | INTRAVENOUS | Status: DC

## 2019-08-21 MED ORDER — PANTOPRAZOLE SODIUM 40 MG PO TBEC: 40.0000 mg | DELAYED_RELEASE_TABLET | Freq: Two times a day (BID) | ORAL | 3 refills | Status: DC

## 2019-08-21 NOTE — Progress Notes (Signed)
Called to room to assist during endoscopic procedure.  Patient ID and intended procedure confirmed with present staff. Received instructions for my participation in the procedure from the performing physician.  

## 2019-08-21 NOTE — Patient Instructions (Signed)
Impression/Recommendations:  Gastritis handout given to patient.  Resume previous diet. Continue present medications. Await pathology results.  Resume Protonix 40 mg twice daily for another 2 weeks, then start to titrate down to 40 mg. Daily.  Return to GI clinic in 3 months, or sooner as needed.  YOU HAD AN ENDOSCOPIC PROCEDURE TODAY AT [facility]:   Refer to the procedure report that was given to you for any specific questions about what was found during the examination.  If the procedure report does not answer your questions, please call your gastroenterologist to clarify.  If you requested that your care partner not be given the details of your procedure findings, then the procedure report has been included in a sealed envelope for you to review at your convenience later.  YOU SHOULD EXPECT: Some feelings of bloating in the abdomen. Passage of more gas than usual.  Walking can help get rid of the air that was put into your GI tract during the procedure and reduce the bloating. If you had a lower endoscopy (such as a colonoscopy or flexible sigmoidoscopy) you may notice spotting of blood in your stool or on the toilet paper. If you underwent a bowel prep for your procedure, you may not have a normal bowel movement for a few days.  Please Note:  You might notice some irritation and congestion in your nose or some drainage.  This is from the oxygen used during your procedure.  There is no need for concern and it should clear up in a day or so.  SYMPTOMS TO REPORT IMMEDIATELY:   Following upper endoscopy (EGD)  Vomiting of blood or coffee ground material  New chest pain or pain under the shoulder blades  Painful or persistently difficult swallowing  New shortness of breath  Fever of 100F or higher  Black, tarry-looking stools  For urgent or emergent issues, a gastroenterologist can be reached at any hour by calling [phone]. Do not use MyChart messaging for  urgent concerns.    DIET:  We do recommend a small meal at first, but then you may proceed to your regular diet.  Drink plenty of fluids but you should avoid alcoholic beverages for 24 hours.  ACTIVITY:  You should plan to take it easy for the rest of today and you should NOT DRIVE or use heavy machinery until tomorrow (because of the sedation medicines used during the test).    FOLLOW UP: Our staff will call the number listed on your records 48-72 hours following your procedure to check on you and address any questions or concerns that you may have regarding the information given to you following your procedure. If we do not reach you, we will leave a message.  We will attempt to reach you two times.  During this call, we will ask if you have developed any symptoms of COVID 19. If you develop any symptoms (ie: fever, flu-like symptoms, shortness of breath, cough etc.) before then, please call [phone].  If you test positive for Covid 19 in the 2 weeks post procedure, please call and report this information to [place].    If any biopsies were taken you will be contacted by phone or by letter within the next 1-3 weeks.  Please call us at [phone] if you have not heard about the biopsies in 3 weeks.    SIGNATURES/CONFIDENTIALITY: You and/or your care partner have signed paperwork which will be entered into your electronic medical record.  These signatures  attest to the fact that that the information above on your After Visit Summary has been reviewed and is understood.  Full responsibility of the confidentiality of this discharge information lies with you and/or your care-partner.

## 2019-08-21 NOTE — Progress Notes (Signed)
Pt Drowsy. VSS. To PACU, report to RN. No anesthetic complications noted.  

## 2019-08-21 NOTE — Progress Notes (Signed)
VS by CW. ?

## 2019-08-21 NOTE — Op Note (Signed)
[place] Patient Name: Brittany Malone Procedure Date: 08/21/2019 2:47 PM MRN: [id number] Endoscopist: Gerrit Heck , MD Age: 54 Referring MD:  Date of Birth: 1965-04-30 Gender: Female Account #: [id number] Procedure:                Upper GI endoscopy Indications:              Follow-up of reflux esophagitis, Follow-up of                            esophageal stenosis                           EGD on 06/18/19 with LA Grade C esophagitis, Hill                            grade 2 valve, and stricture at 36 cm, dilated with                            20 mm TTS balloon and fractured with cold forceps.                            She was started on Protonix 40 mg PO BID, and                            reports good clinical response with no further                            dysphagia. She presents today to evaluate for                            endoscopic response to therapy. Medicines:                Monitored Anesthesia Care Procedure:                Pre-Anesthesia Assessment:                           - Prior to the procedure, a History and Physical                            was performed, and patient medications and                            allergies were reviewed. The patient's tolerance of                            previous anesthesia was also reviewed. The risks                            and benefits of the procedure and the sedation                            options and risks were discussed with the patient.  All questions were answered, and informed consent                            was obtained. Prior Anticoagulants: The patient has                            taken no previous anticoagulant or antiplatelet                            agents. ASA Grade Assessment: II - A patient with                            mild systemic disease. After reviewing the risks                            and benefits, the patient was deemed in                             satisfactory condition to undergo the procedure.                           After obtaining informed consent, the endoscope was                            passed under direct vision. Throughout the                            procedure, the patient's blood pressure, pulse, and                            oxygen saturations were monitored continuously. The                            Endoscope was introduced through the mouth, and                            advanced to the second part of duodenum. The upper                            GI endoscopy was accomplished without difficulty.                            The patient tolerated the procedure well. Scope In: Scope Out: Findings:                 The Z-line was mildly irregular and was found 37 cm                            from the incisors. The previously noted reflux                            esophagitis has since healed. Biopsies were taken  with a cold forceps for histology. Estimated blood                            loss was minimal.                           The middle third of the esophagus was normal.                           A single area of ectopic gastric mucosa was found                            in the upper third of the esophagus.                           The gastroesophageal flap valve was visualized                            endoscopically and classified as Hill Grade II                            (fold present, opens with respiration).                           Localized mild inflammation characterized by                            erythema was found in the prepyloric region of the                            stomach. Biopsies were taken with a cold forceps                            for histology. Estimated blood loss was minimal.                           A few 2 to 3 mm sessile polyps with no stigmata of                            recent bleeding were found in the gastric fundus                             and in the gastric body. These polyps were removed                            with a cold biopsy forceps. Resection and retrieval                            were complete. Estimated blood loss was minimal.                           The duodenal bulb, first portion of the duodenum  and second portion of the duodenum were normal. Complications:            No immediate complications. Estimated Blood Loss:     Estimated blood loss was minimal. Impression:               - Z-line irregular, 37 cm from the incisors.                            Biopsied.                           - Normal middle third of esophagus.                           - Ectopic gastric mucosa in the upper third of the                            esophagus.                           - Gastroesophageal flap valve classified as Hill                            Grade II (fold present, opens with respiration).                           - Gastritis. Biopsied.                           - A few gastric polyps. Resected and retrieved.                           - Normal duodenal bulb, first portion of the                            duodenum and second portion of the duodenum. Recommendation:           - Patient has a contact number available for                            emergencies. The signs and symptoms of potential                            delayed complications were discussed with the                            patient. Return to normal activities tomorrow.                            Written discharge instructions were provided to the                            patient.                           - Resume previous diet.                           -  Continue present medications.                           - Await pathology results.                           - Resume Protonix 40 mg twice daily for another 2                            weeks, then start to titrate down to 40 mg  daily.                           - Return to GI clinic in 3 months, or sooner as                            needed. Gerrit Heck, MD 08/21/2019 3:24:52 PM

## 2019-08-22 ENCOUNTER — Other Ambulatory Visit: Payer: Self-pay | Admitting: Family Medicine

## 2019-08-22 DIAGNOSIS — E611 Iron deficiency: Secondary | ICD-10-CM

## 2019-08-23 ENCOUNTER — Telehealth: Payer: Self-pay | Admitting: *Deleted

## 2019-08-23 NOTE — Telephone Encounter (Signed)
1. Have you developed a fever since your procedure? no  2.   Have you had an respiratory symptoms (SOB or cough) since your procedure? no  3.   Have you tested positive for COVID 19 since your procedure no  4.   Have you had any family members/close contacts diagnosed with the COVID 19 since your procedure?  no   If yes to any of these questions please route to Joylene John, RN and Erenest Rasher, RN Follow up Call-  Call back number 08/21/2019 06/18/2019  Post procedure Call Back phone  # [phone]  Permission to leave phone message Yes Yes  Some recent data might be hidden     Patient questions:  Do you have a fever, pain , or abdominal swelling? No. Pain Score  0 *  Have you tolerated food without any problems? Yes.    Have you been able to return to your normal activities? Yes.    Do you have any questions about your discharge instructions: Diet   No. Medications  No. Follow up visit  No.  Do you have questions or concerns about your Care? No.  Actions: * If pain score is 4 or above: No action needed, pain <4.

## 2019-08-23 NOTE — Telephone Encounter (Signed)
First attempt, left VM.  

## 2019-09-07 ENCOUNTER — Encounter: Payer: Self-pay | Admitting: Gastroenterology

## 2019-09-12 DIAGNOSIS — L309 Dermatitis, unspecified: Secondary | ICD-10-CM | POA: Diagnosis not present

## 2019-09-12 DIAGNOSIS — L308 Other specified dermatitis: Secondary | ICD-10-CM | POA: Diagnosis not present

## 2019-09-28 ENCOUNTER — Other Ambulatory Visit: Payer: Self-pay | Admitting: Family Medicine

## 2019-09-28 ENCOUNTER — Other Ambulatory Visit (INDEPENDENT_AMBULATORY_CARE_PROVIDER_SITE_OTHER): Payer: BC Managed Care – PPO

## 2019-09-28 ENCOUNTER — Other Ambulatory Visit: Payer: Self-pay

## 2019-09-28 DIAGNOSIS — E611 Iron deficiency: Secondary | ICD-10-CM

## 2019-09-28 LAB — IBC + FERRITIN
Ferritin: 4.3 ng/mL — ABNORMAL LOW (ref 10.0–291.0)
Iron: 35 ug/dL — ABNORMAL LOW (ref 42–145)
Saturation Ratios: 6.9 % — ABNORMAL LOW (ref 20.0–50.0)
Transferrin: 362 mg/dL — ABNORMAL HIGH (ref 212.0–360.0)

## 2019-10-11 ENCOUNTER — Other Ambulatory Visit: Payer: BC Managed Care – PPO

## 2019-10-12 ENCOUNTER — Other Ambulatory Visit: Payer: BC Managed Care – PPO

## 2019-10-12 ENCOUNTER — Other Ambulatory Visit: Payer: Self-pay

## 2019-10-12 DIAGNOSIS — E611 Iron deficiency: Secondary | ICD-10-CM | POA: Diagnosis not present

## 2019-10-13 LAB — URINALYSIS, MICROSCOPIC ONLY
Bacteria, UA: NONE SEEN /HPF
Hyaline Cast: NONE SEEN /LPF
RBC / HPF: NONE SEEN /HPF (ref 0–2)

## 2019-10-23 ENCOUNTER — Other Ambulatory Visit: Payer: Self-pay | Admitting: Rheumatology

## 2019-10-23 DIAGNOSIS — M0579 Rheumatoid arthritis with rheumatoid factor of multiple sites without organ or systems involvement: Secondary | ICD-10-CM

## 2019-10-23 NOTE — Telephone Encounter (Signed)
Last Visit: 06/19/2019 Next Visit: 11/20/2019 Labs:05/16/2019 ALT 37 Eye exam: 10/12/18 WNL   Current Dose per office note 06/19/2019: Plaquenil 200 mg p.o. twice daily. 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   Okay to refill 30 day supply Plaquenil?

## 2019-10-31 DIAGNOSIS — M47812 Spondylosis without myelopathy or radiculopathy, cervical region: Secondary | ICD-10-CM | POA: Diagnosis not present

## 2019-10-31 DIAGNOSIS — M5030 Other cervical disc degeneration, unspecified cervical region: Secondary | ICD-10-CM | POA: Diagnosis not present

## 2019-11-01 ENCOUNTER — Other Ambulatory Visit (INDEPENDENT_AMBULATORY_CARE_PROVIDER_SITE_OTHER): Payer: BC Managed Care – PPO

## 2019-11-01 ENCOUNTER — Other Ambulatory Visit: Payer: Self-pay | Admitting: *Deleted

## 2019-11-01 ENCOUNTER — Encounter: Payer: Self-pay | Admitting: Rheumatology

## 2019-11-01 DIAGNOSIS — R195 Other fecal abnormalities: Secondary | ICD-10-CM

## 2019-11-01 DIAGNOSIS — E611 Iron deficiency: Secondary | ICD-10-CM | POA: Diagnosis not present

## 2019-11-01 DIAGNOSIS — Z79899 Other long term (current) drug therapy: Secondary | ICD-10-CM

## 2019-11-01 LAB — FECAL OCCULT BLOOD, IMMUNOCHEMICAL: Fecal Occult Bld: POSITIVE — AB

## 2019-11-02 DIAGNOSIS — Z79899 Other long term (current) drug therapy: Secondary | ICD-10-CM | POA: Diagnosis not present

## 2019-11-03 LAB — COMPLETE METABOLIC PANEL WITH GFR
AG Ratio: 1.6 (calc) (ref 1.0–2.5)
ALT: 24 U/L (ref 6–29)
AST: 18 U/L (ref 10–35)
Albumin: 4 g/dL (ref 3.6–5.1)
Alkaline phosphatase (APISO): 66 U/L (ref 37–153)
BUN: 14 mg/dL (ref 7–25)
CO2: 25 mmol/L (ref 20–32)
Calcium: 9 mg/dL (ref 8.6–10.4)
Chloride: 108 mmol/L (ref 98–110)
Creat: 0.69 mg/dL (ref 0.50–1.05)
GFR, Est African American: 113 mL/min/{1.73_m2} (ref >=60)
GFR, Est Non African American: 97 mL/min/{1.73_m2} (ref >=60)
Globulin: 2.5 g/dL (calc) (ref 1.9–3.7)
Glucose, Bld: 126 mg/dL — ABNORMAL HIGH (ref 65–99)
Potassium: 4.3 mmol/L (ref 3.5–5.3)
Sodium: 143 mmol/L (ref 135–146)
Total Bilirubin: 0.2 mg/dL (ref 0.2–1.2)
Total Protein: 6.5 g/dL (ref 6.1–8.1)

## 2019-11-03 LAB — CBC WITH DIFFERENTIAL/PLATELET
Absolute Monocytes: 402 cells/uL (ref 200–950)
Basophils Absolute: 31 cells/uL (ref 0–200)
Basophils Relative: 0.8 %
Eosinophils Absolute: 90 cells/uL (ref 15–500)
Eosinophils Relative: 2.3 %
HCT: 39.4 % (ref 35.0–45.0)
Hemoglobin: 12.9 g/dL (ref 11.7–15.5)
Lymphs Abs: 1108 cells/uL (ref 850–3900)
MCH: 27.9 pg (ref 27.0–33.0)
MCHC: 32.7 g/dL (ref 32.0–36.0)
MCV: 85.3 fL (ref 80.0–100.0)
MPV: 10.3 fL (ref 7.5–12.5)
Monocytes Relative: 10.3 %
Neutro Abs: 2270 cells/uL (ref 1500–7800)
Neutrophils Relative %: 58.2 %
Platelets: 217 10*3/uL (ref 140–400)
RBC: 4.62 10*6/uL (ref 3.80–5.10)
RDW: 15.8 % — ABNORMAL HIGH (ref 11.0–15.0)
Total Lymphocyte: 28.4 %
WBC: 3.9 10*3/uL (ref 3.8–10.8)

## 2019-11-06 NOTE — Progress Notes (Addendum)
Office Visit Note  Patient: Brittany Malone             Date of Birth: 1965-05-13           MRN: [id number]             PCP: Shelda Pal, DO Referring: Shelda Pal* Visit Date: 11/20/2019 Occupation: @GUAROCC @  Subjective:  Pain in multiple joints   History of Present Illness: Brittany Malone is a 54 y.o. female with history of seropositive rheumatoid arthritis and chronic uveitis of both eyes.  Patient is taking Plaquenil 200 mg 1 tablet by mouth twice daily.  She continues to tolerate Plaquenil without any side effects.  Patient reports that she has been having increased pain in multiple joints on and off for the past 1 year.  She is currently having pain in both elbows, both wrists, both hands, and both feet.  She denies any joint swelling in her hands at this time.  She states that her feet pain has been chronic but over the past several months she started to notice deformity of her right second toe and has become more concerned.  According to the patient she had an ultrasound of both feet performed by Dr. Waldemar Dickens in September 2020 which did reveal mild synovitis.  At that time she did not want to add on methotrexate but would like to discuss treatment options today.  She denies any uveitis flares recently.  She has not had any eye pain, photophobia, or eye redness recently.  She states that she had her Plaquenil eye exam in February 2021. Patient reports she is also been having increased neck pain and stiffness since February 2021 when she had an injury at work.  She has been experiencing headaches on a daily basis as well.  She has been working with Dr. Nani Ravens at [facility] as well as with [employer]  She had an MRI of the C-spine on 09/27/2019.  She had a facet joint injection recently, which provided temporary relief.  She plans on having 1 more injection and if she responds she will proceed with a nerve ablation if workers comp agrees.  She has been  having to take more ibuprofen recently due to the discomfort, which has caused GI upset so she has tried to switch to taking tylenol as needed for pain relief.    Activities of Daily Living:  Patient reports morning stiffness for 1  hour.   Patient Reports nocturnal pain.  Difficulty dressing/grooming: Denies Difficulty climbing stairs: Denies Difficulty getting out of chair: Denies Difficulty using hands for taps, buttons, cutlery, and/or writing: Denies  Review of Systems  Constitutional: Positive for fatigue.  HENT: Positive for mouth dryness. Negative for mouth sores and nose dryness.   Eyes: Negative for pain, visual disturbance and dryness.  Respiratory: Negative for cough, hemoptysis, shortness of breath and difficulty breathing.   Cardiovascular: Negative for chest pain, palpitations, hypertension and swelling in legs/feet.  Gastrointestinal: Negative for abdominal pain, constipation and diarrhea.  Endocrine: Negative for increased urination.  Genitourinary: Negative for painful urination.  Musculoskeletal: Positive for arthralgias, joint pain, myalgias, morning stiffness, muscle tenderness and myalgias. Negative for joint swelling and muscle weakness.  Skin: Negative for color change, pallor, rash, hair loss, nodules/bumps, skin tightness, ulcers and sensitivity to sunlight.  Allergic/Immunologic: Negative for susceptible to infections.  Neurological: Positive for headaches. Negative for dizziness, numbness and weakness.  Hematological: Positive for anemia. Negative for swollen glands.  Psychiatric/Behavioral: Positive for sleep  disturbance. Negative for depressed mood. The patient is not nervous/anxious.     PMFS History:  Patient Active Problem List   Diagnosis Date Noted  . Rheumatoid arthritis involving multiple sites with positive rheumatoid factor ([place]) 08/15/2018  . Family history of rheumatoid arthritis 08/15/2018  . Chronic uveitis of both eyes 08/15/2018  . Pes  cavus 08/15/2018  . History of gastroesophageal reflux (GERD) 08/15/2018    Past Medical History:  Diagnosis Date  . Allergy    SEASONAL  . Anemia   . Arthritis   . GERD (gastroesophageal reflux disease)   . Insomnia   . Rheumatoid arthritis ([place])     Family History  Problem Relation Age of Onset  . Lupus Mother   . Fibromyalgia Mother   . Hyperlipidemia Mother   . Hypertension Father   . High Cholesterol Father   . Hyperlipidemia Father   . Colon polyps Father   . Kidney Stones Brother   . Healthy Son   . Hypertension Daughter   . Migraines Daughter   . Colon cancer Neg Hx   . Esophageal cancer Neg Hx   . Rectal cancer Neg Hx   . Stomach cancer Neg Hx    Past Surgical History:  Procedure Laterality Date  . ABDOMINAL HYSTERECTOMY    . ANKLE SURGERY    . UPPER GI ENDOSCOPY     Social History   Social History Narrative  . Not on file   Immunization History  Administered Date(s) Administered  . Influenza,inj,Quad PF,6+ Mos 11/16/2018  . Moderna SARS-COVID-2 Vaccination 03/08/2019, 04/05/2019  . Pneumococcal Polysaccharide-23 11/16/2018  . Tdap 05/16/2010  . Zoster Recombinat (Shingrix) 11/16/2018, 01/16/2019     Objective: Vital Signs: BP 119/65 (BP Location: Left Arm, Patient Position: Sitting, Cuff Size: Large)   Pulse 80   Resp 16   Ht 5\' 8"  (1.727 m)   Wt 262 lb 6.4 oz (119 kg)   BMI 39.90 kg/m    Physical Exam Vitals and nursing note reviewed.  Constitutional:      Appearance: She is well-developed.  HENT:     Head: Normocephalic and atraumatic.  Eyes:     Conjunctiva/sclera: Conjunctivae normal.  Pulmonary:     Effort: Pulmonary effort is normal.  Abdominal:     Palpations: Abdomen is soft.  Musculoskeletal:     Cervical back: Normal range of motion.  Skin:    General: Skin is warm and dry.     Capillary Refill: Capillary refill takes less than 2 seconds.  Neurological:     Mental Status: She is alert and oriented to person, place, and  time.  Psychiatric:        Behavior: Behavior normal.      Musculoskeletal Exam: C-spine limited range of motion with lateral rotation.  Thoracic and lumbar spine have good range of motion with no discomfort.  No midline spinal tenderness.  No SI joint tenderness.  Shoulder joints, elbow joints, wrist joints, MCPs, PIPs, DIPs have good range of motion with no synovitis.  She has tenderness of the left first MCP joint.  Complete fist formation bilaterally.  Hip joints have good range of motion with no discomfort at this time.  Knee joints have good range of motion with no warmth or effusion.  Ankle joints have good range of motion with no tenderness or inflammation.  She has tenderness of the left third and fourth MTPs and right second and third MTP joints.  Angulation and early signs of right 2nd hammertoe  noted.   CDAI Exam: CDAI Score: 4  Patient Global: 5 mm; Provider Global: 5 mm Swollen: 0 ; Tender: 7  Joint Exam 11/20/2019      Right  Left  Elbow   Tender   Tender  MCP 1      Tender  MTP 2   Tender     MTP 3   Tender   Tender  MTP 4      Tender     Investigation: No additional findings.  Imaging: No results found.  Recent Labs: Lab Results  Component Value Date   WBC 3.9 11/02/2019   HGB 12.9 11/02/2019   PLT 217 11/02/2019   NA 143 11/02/2019   K 4.3 11/02/2019   CL 108 11/02/2019   CO2 25 11/02/2019   GLUCOSE 126 (H) 11/02/2019   BUN 14 11/02/2019   CREATININE 0.69 11/02/2019   BILITOT 0.2 11/02/2019   ALKPHOS 68 08/16/2019   AST 18 11/02/2019   ALT 24 11/02/2019   PROT 6.5 11/02/2019   ALBUMIN 4.2 08/16/2019   CALCIUM 9.0 11/02/2019   GFRAA 113 11/02/2019    Speciality Comments: PLQ Eye Exam: 10/12/18 WNL with Pre-existing Toxo Scars @ [facility] Follow up  in 6 months  Procedures:  No procedures performed Allergies: Penicillins  Chest x-ray on 10/07/2022 no acute cardiopulmonary disease Assessment / Plan:     Visit Diagnoses:  Rheumatoid arthritis involving multiple sites with positive rheumatoid factor ([place]): She presents today with increased pain and stiffness in multiple joints.  She has been having intermittent discomfort in both hands and especially both feet.  She has tenderness palpation of the right second and third MTPs and left third and fourth MTP joints.  Slight angulation and flexion contracture of the  second PIP of her right foot was noted.  She had an ultrasound of both feet obtained on 11/29/2018 which revealed mild synovitis.  At that time Dr. Estanislado Pandy discussed starting on methotrexate but she declined.  She has been taking Plaquenil 200 mg 1 tablet by mouth twice daily but she does not feel that PLQ has been as effective as it was in the past.  We discussed different treatment options today in the office.  Indications, contraindications, potential side effects of methotrexate were discussed.  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  She has been experiencing gastritis and GI upset recently, so I do not think oral MTX will be a good option at this time.  She will be starting on methotrexate 0.6 mL sq injections once weekly and if labs are stable in 2 weeks she will increase to 0.8 mL injections once weekly.  She will take folic acid 2 mg daily. Prescriptions pending lab results.  She will continue taking Plaquenil as prescribed.  We will check TB gold, SPEP, and immunoglobulins today.  She has had all other baseline immunosuppressive lab work.  She is aware that she will return for lab work in 2 weeks x 2 then 2 months then every 3 months.  Standing orders are in place for CBC and CMP.  She was advised to notify us if she cannot tolerate taking methotrexate.  She will follow-up in the office in 8 weeks to assess her response.  Drug Counseling TB Gold: Pending Immunoglobulins and SPEP pending  Hepatitis panel: Hep B negative on 08/16/19.  Hep C negative 11/16/18.  HIV negative 11/16/18.   Chest-xray:  10/06/2012 no  acute cardiopulmonary disease.  Contraception: Postmenopausal  Alcohol use: Discussed the importance of avoiding alcohol use.   Patient was counseled on the purpose, proper use, and adverse effects of methotrexate including nausea, infection, and signs and symptoms of pneumonitis.  Reviewed instructions with patient to take methotrexate weekly along with folic acid daily.  Discussed the importance of frequent monitoring of kidney and liver function and blood counts, and provided patient with standing lab instructions.  Counseled patient to avoid NSAIDs and alcohol while on methotrexate.  Provided patient with educational materials on methotrexate and answered all questions.  Advised patient to get annual influenza vaccine and to get a pneumococcal vaccine if patient has not already had one.  Patient voiced understanding.  Patient consented to methotrexate use.  Will upload into chart.    High risk medication use -Prescription for Methotrexate pending lab results.  She will be starting on MTX 0.6 ml sq injections once weekly x2 weeks and if labs are stable she will increase to 0.8 ml sq injections weekly.  She will take folic acid 2 mg daily.  She will continue taking Plaquenil 200 mg 1 tablet by mouth twice daily.  PLQ eye exam was performed on 04/12/19-stable on PLQ-continue follow up every 6 months.  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 CBC and CMP were within normal limits on 11/02/2019.  She will be due to update lab work in 5 months to monitor for drug toxicity.   She has received both COVID-19 vaccinations and was encouraged to receive the third dose.  She was advised to avoid taking Tylenol and NSAIDs 24 hours prior to the third dose.  She can continue taking Plaquenil as prescribed.  She was advised to notify us or her PCP if she develops a COVID-19 infection in order to receive the antibody  infusion.  All questions were addressed and she voiced understanding.   - Plan: QuantiFERON-TB Gold Plus, Serum protein electrophoresis with reflex, IgG, IgA, IgM  Chronic uveitis of both eyes: She has not had uveitis flares recently.  She is followed closely by her ophthalmologist.  She is not experiencing any eye pain, redness, or photophobia at this time.    Chronic SI joint pain: She has chronic lower back discomfort.    Pes cavus: Unchanged.  Discussed the importance of wearing proper fitting shoes with support.   Cervical stenosis of spinal canal: According to the patient she injured her neck while at work in February 2021.  She had a CT of the cervical spine performed on 04/13/2019 and had an updated MRI performed on 09/27/2019 which revealed multilevel moderate to moderately severe cervical spondylosis and straightening of the usual cervical lordosis contributing to multilevel mild to severe neural foraminal stenosis and mild to moderate acquired spinal canal stenosis greatest at C5-C6.  She has had one facet joint injection which provided temporary relief and is planning on having a second injection.  If she has improvement with the second injection the plan is to proceed with a spinal nerve ablation if Worker's Comp agrees.  She continues to have significant discomfort and stiffness as well as daily headaches.  She has limited ROM especially with flexion and extension on exam. She has been taking ibuprofen for pain relief but has developed a gastritis and has had to discontinue.  She has been using Tylenol as needed without much relief.  She plans on continuing to follow up with Dr. Madelin Rear comp.   Other fatigue: Chronic but stable.  Other medical conditions are listed as follows:   History of diverticulosis  History of gastroesophageal reflux (GERD)  Orders: Orders Placed This Encounter  Procedures  . QuantiFERON-TB Gold Plus  . Serum protein electrophoresis with reflex    . IgG, IgA, IgM   No orders of the defined types were placed in this encounter.   Follow-Up Instructions: Return in about 2 months (around 01/20/2020) for Rheumatoid arthritis, Uveitis.   Ofilia Neas, PA-C  Note - This record has been created using Dragon software.  Chart creation errors have been sought, but may not always  have been located. Such creation errors do not reflect on  the standard of medical care.

## 2019-11-20 ENCOUNTER — Other Ambulatory Visit: Payer: Self-pay

## 2019-11-20 ENCOUNTER — Ambulatory Visit (INDEPENDENT_AMBULATORY_CARE_PROVIDER_SITE_OTHER): Payer: BC Managed Care – PPO | Admitting: Physician Assistant

## 2019-11-20 ENCOUNTER — Encounter: Payer: Self-pay | Admitting: Physician Assistant

## 2019-11-20 VITALS — BP 119/65 | HR 80 | Resp 16 | Ht 68.0 in | Wt 262.4 lb

## 2019-11-20 DIAGNOSIS — G8929 Other chronic pain: Secondary | ICD-10-CM

## 2019-11-20 DIAGNOSIS — Z79899 Other long term (current) drug therapy: Secondary | ICD-10-CM | POA: Diagnosis not present

## 2019-11-20 DIAGNOSIS — M0579 Rheumatoid arthritis with rheumatoid factor of multiple sites without organ or systems involvement: Secondary | ICD-10-CM | POA: Diagnosis not present

## 2019-11-20 DIAGNOSIS — R5383 Other fatigue: Secondary | ICD-10-CM

## 2019-11-20 DIAGNOSIS — Z8719 Personal history of other diseases of the digestive system: Secondary | ICD-10-CM

## 2019-11-20 DIAGNOSIS — Z111 Encounter for screening for respiratory tuberculosis: Secondary | ICD-10-CM | POA: Diagnosis not present

## 2019-11-20 DIAGNOSIS — H2013 Chronic iridocyclitis, bilateral: Secondary | ICD-10-CM

## 2019-11-20 DIAGNOSIS — Q667 Congenital pes cavus, unspecified foot: Secondary | ICD-10-CM

## 2019-11-20 DIAGNOSIS — M4802 Spinal stenosis, cervical region: Secondary | ICD-10-CM

## 2019-11-20 DIAGNOSIS — M533 Sacrococcygeal disorders, not elsewhere classified: Secondary | ICD-10-CM | POA: Diagnosis not present

## 2019-11-20 NOTE — Telephone Encounter (Signed)
Pending lab results, patient will be starting on methotrexate vial and syringe and folic acid per Hazel Sams, PA-C. Thanks!

## 2019-11-20 NOTE — Patient Instructions (Addendum)
Standing Labs We placed an order today for your standing lab work.   Please have your standing labs drawn in 2 weeks x2, 2 months, and every 3 months   If possible, please have your labs drawn 2 weeks prior to your appointment so that the provider can discuss your results at your appointment.  We have open lab daily Monday through Thursday from 8:30-12:30 PM and 1:30-4:30 PM and Friday from 8:30-12:30 PM and 1:30-4:00 PM at the office of Dr. Bo Merino, [facility].   Please be advised, patients with office appointments requiring lab work will take precedents over walk-in lab work.  If possible, please come for your lab work on Monday and Friday afternoons, as you may experience shorter wait times. The office is located at [address], [place], [place], [place] [address] No appointment is necessary.   Labs are drawn by [facility]. Please bring your co-pay at the time of your lab draw.  You may receive a bill from [facility] for your lab work.  If you wish to have your labs drawn at another location, please call the office 24 hours in advance to send orders.  If you have any questions regarding directions or hours of operation,  please call [phone].   As a reminder, please drink plenty of water prior to coming for your lab work. Thanks!    COVID-19 vaccine recommendations:   COVID-19 vaccine is recommended for everyone (unless you are allergic to a vaccine component), even if you are on a medication that suppresses your immune system.   If you are on Methotrexate, Cellcept (mycophenolate), Rinvoq, Morrie Sheldon, and Olumiant- hold the medication for 1 week after each vaccine. Hold Methotrexate for 2 weeks after the single dose COVID-19 vaccine.   If you are on Orencia subcutaneous injection - hold medication one week prior to and one week after the first COVID-19 vaccine dose (only).   If you are on Orencia IV infusions- time vaccination administration so that the first  COVID-19 vaccination will occur four weeks after the infusion and postpone the subsequent infusion by one week.   If you are on Cyclophosphamide or Rituxan infusions please contact your doctor prior to receiving the COVID-19 vaccine.   Do not take Tylenol or any anti-inflammatory medications (NSAIDs) 24 hours prior to the COVID-19 vaccination.   There is no direct evidence about the efficacy of the COVID-19 vaccine in individuals who are on medications that suppress the immune system.   Even if you are fully vaccinated, and you are on any medications that suppress your immune system, please continue to wear a mask, maintain at least six feet social distance and practice hand hygiene.   If you develop a COVID-19 infection, please contact your PCP or our office to determine if you need antibody infusion.  The booster vaccine is now available for immunocompromised patients. It is advised that if you had Pfizer vaccine you should get Coca[employer]Cola booster.  If you had a Moderna vaccine then you should get a Moderna booster. Johnson and Wynetta Emery does not have a booster vaccine at this time.  Please see the following web sites for updated information.   https://www.rheumatology.org/Portals/0/Files/COVID-19-Vaccination-Patient-Resources.pdf  https://www.rheumatology.org/About-Us/Newsroom/Press-Releases/ID/1159    Methotrexate subcutaneous injection O que  este medicamento? O METOTREXATO  um frmaco citotxico que tambm suprime o sistema imunitrio.  usado para tratar a psorase e a artrite reumatoide. Este medicamento pode ser usado para outros propsitos; em caso de dvidas, pergunte ao seu profissional de sade ou farmacutico. NOMES  DE MARCAS COMUNS: Otrexup, Rasuvo O que devo dizer a meu profissional de sade antes de tomar este medicamento? Precisam saber se voc tem algum dos seguintes problemas ou estados de sade:  fluido no abdome ou nos pulmes  se voc bebe com frequncia  infeco  ou problemas do sistema imunitrio  doenas renais  doenas hepticas  contagens sanguneas baixas, como contagem baixa de glbulos brancos, plaquetas ou glbulos vermelhos  doenas pulmonares  radioterapia  lcera estomacal  colite ulcerativa  reao estranha ou alergia ao metotrexato  reao estranha ou alergia a outros medicamentos  reao estranha ou alergia a alimentos, corantes ou conservantes  est grvida ou tentando engravidar  est [employer] devo usar este medicamento? Este medicamento deve ser injetado por via subcutnea. Voc ser ensinado a como preparar e administrar o medicamento. Consulte as Instrues de Uso que esto includas na embalagem deste medicamento. Use exatamente como indicado. Tome este medicamento em intervalos regulares. No tome este medicamento com frequncia maior do que a indicada. Este medicamento deve ser tomado semanalmente e NO diariamente.  muito importante que as Social worker e seringas usadas sejam descartadas em um coletor especial para materiais perfurocortantes. No as coloque na lata de lixo. Se no tiver um coletor para materiais perfurocortantes, pea um a seu farmacutico ou profissional de sade. Fale com seu pediatra a respeito do uso deste medicamento em crianas. Embora este medicamento possa ser receitado para crianas a [employer] 2 anos de idade para certas condies, algumas precaues so necessrias. Superdosagem: Se achar que tomou uma superdosagem deste medicamento, entre em contato imediatamente com o [facility]es ou v a [employer]. OBSERVAO: Este medicamento  s para voc. No compartilhe este medicamento com outras pessoas. E se eu deixar de tomar uma dose? Se voc no tem certeza se este medicamento foi injetado ou se tiver dificuldade em administrar a injeo, no injete outra dose. Fale com seu mdico ou profissional de sade. O que pode interagir com este medicamento? Este  medicamento pode interagir com os seguintes remdios:  acitretina  aspirina ou medicamentos semelhantes  aspirina, incluindo salicilatos  azatioprina  alguns antibiticos como cloranfenicol, penicilina e tetraciclina  ciclosporina  ouro  hidroxicloroquina  vacinas de vrus vivo  mercaptopurina  AINEs, medicamentos analgsicos e anti-inflamatrios, como ibuprofeno, naproxeno  outros agentes citotxicos  penicilamina  fenilbutazona  fenitona  probenecida  retinoides, como isotretinona e tretinona  corticoides, como prednisona ou cortisona  sulfonamidas, como sulfassalazina e trimetoprima/sulfametoxazol  teofilina Esta lista pode no descrever todas as interaes possveis. D ao seu profissional de sade uma lista de todos os medicamentos, ervas medicinais, remdios de venda livre, ou suplementos alimentares que voc [place]. Diga tambm se voc fuma, bebe, ou [place] drogas ilcitas. Alguns destes podem interagir com o seu medicamento. Ao que devo ficar atento quando estiver [employer] medicamento? Evite bebidas alcolicas. Este medicamento pode aumentar sua sensibilidade ao sol. No tome sol. Se no puder evitar a exposio ao sol, use roupas protetoras e protetor solar. No use lmpadas de luz solar e no faa bronzeamento artificial. Voc pode sentir sonolncia ou tontura. No dirija, no opere mquinas e no faa nada que exija concentrao mental at [employer] como o medicamento lhe afeta. No se sente nem se levante rpido demais, principalmente se for um paciente idoso. Isso diminui o risco de [place] ou desmaio. Voc precisar fazer exames de sangue peridicos enquanto estiver [employer]. Se tiver febre, calafrios, dor de garganta ou outros sintomas de  resfriado ou gripe, pea orientao ao seu mdico ou profissional de sade. No se automedique. Este medicamento pode diminuir a capacidade do seu organismo de combater infeces. Evite ficar perto  de pessoas doentes. Este medicamento pode aumentar o risco de hematomas e hemorragias. Ao notar qualquer sangramento fora do comum, entre em contato com seu mdico ou profissional de sade imediatamente. Se tiver uma crise grave de diarreia, enjoo e vmitos ou se estiver suando em demasia, entre em contato com seu mdico ou profissional de sade. Tomar este medicamento pode ser perigoso se tiver perdido [employer] lquido. Fale com o seu mdico a respeito do risco de cncer. Voc pode ter um maior risco de cncer se tomar [employer]. Homens e mulheres devem usar um mtodo anticoncepcional eficaz enquanto estiverem tomando este medicamento. No engravide enquanto estiver tomando [employer] ou at [employer] ocorrido pelo menos 1 ciclo menstrual normal aps interromper seu uso. Mulheres que queiram engravidar ou que acreditem que possam estar grvidas devem avisar aos seus mdicos. Homens no devem engravidar suas parceiras enquanto tomarem este medicamento e por 3 meses depois de parar o tratamento. H risco de graves efeitos colaterais no feto. Para mais informaes, fale com seu mdico, profissional de sade ou farmacutico. No amamente enquanto estiver [employer]. Que efeitos colaterais posso sentir aps usar este medicamento? Efeitos colaterais que devem ser informados ao seu mdico ou profissional de sade o mais rpido possvel:  reaes alrgicas, como erupo na pele, coceira, urticria, ou inchao do rosto, dos lbios ou da lngua  problemas respiratrios ou falta de ar  diarreia  tosse seca e no produtiva  diminuio na contagem de clulas sanguneas - este medicamento pode diminuir o nmero de glbulos brancos, glbulos vermelhos e plaquetas. Isso pode aumentar o risco de infeces e sangramentos  aftas  vermelhido, bolhas, descamao ou afrouxamento da pele, inclusive dentro da boca  sinais de infeco - febre ou calafrios, tosse, dor de garganta, dor ou  dificuldade para urinar  sinais ou sintomas de hemorragia, como fezes sanguinolentas ou negras com aspecto de piche; urina vermelha ou marrom escuro; tosse ou vmitos com sangue ou com grumos marrons com aspecto de borra de caf; manchas vermelhas na pele; e hematomas ou sangramentos fora do comum nos olhos, gengivas ou nariz  sinais e sintomas de leso renal, tais como dificuldade para urinar ou alteraes na quantidade de urina  sinais e sintomas de leso heptica, tais como urina amarela escura ou marrom; TEFL teacher geral ou sintomas de gripe; fezes de cor clara; perda de apetite; nuseas; dor no lado superior direito da barriga; cansao ou fraqueza fora do comum; amarelecimento dos olhos ou da pele Efeitos colaterais que normalmente no precisam de cuidados mdicos (avise ao seu mdico ou profissional de sade se persistirem ou forem incmodos):  tontura  queda de cabelo  dor de cabea  dor de estmago  desarranjo estomacal  vmitos Esta lista pode no descrever todos os efeitos colaterais possveis. Para mais orientaes sobre efeitos colaterais, consulte o seu mdico. Voc pode relatar a ocorrncia de efeitos colaterais  FDA pelo telefone [phone]. Onde devo guardar meu medicamento? [facility] fora do [employer]. Voc ser instrudo sobre como conservar o medicamento. Descartar qualquer medicamento no utilizado aps a data de validade impressa no rtulo ou embalagem. OBSERVAO: Este folheto  um resumo. Pode no cobrir todas as informaes possveis. Se tiver dvidas a respeito deste medicamento, fale com seu mdico, farmacutico ou profissional de sade.  2020 Elsevier/Gold Standard (2017-01-24 00:00:00)

## 2019-11-22 LAB — PROTEIN ELECTROPHORESIS, SERUM, WITH REFLEX
Albumin ELP: 4.2 g/dL (ref 3.8–4.8)
Alpha 1: 0.3 g/dL (ref 0.2–0.3)
Alpha 2: 0.8 g/dL (ref 0.5–0.9)
Beta 2: 0.5 g/dL (ref 0.2–0.5)
Beta Globulin: 0.5 g/dL (ref 0.4–0.6)
Gamma Globulin: 1 g/dL (ref 0.8–1.7)
Total Protein: 7.2 g/dL (ref 6.1–8.1)

## 2019-11-22 LAB — IGG, IGA, IGM
IgG (Immunoglobin G), Serum: 1057 mg/dL (ref 600–1640)
IgM, Serum: 81 mg/dL (ref 50–300)
Immunoglobulin A: 368 mg/dL — ABNORMAL HIGH (ref 47–310)

## 2019-11-22 LAB — QUANTIFERON-TB GOLD PLUS
Mitogen-NIL: 9.67 IU/mL
NIL: 0.01 IU/mL
QuantiFERON-TB Gold Plus: NEGATIVE
TB1-NIL: 0 IU/mL
TB2-NIL: 0.01 IU/mL

## 2019-11-22 NOTE — Progress Notes (Signed)
SPEP normal.  IgA mildly elevated rest of immunoglobulins WNL.

## 2019-11-23 MED ORDER — FOLIC ACID 1 MG PO TABS: 2.0000 mg | ORAL_TABLET | Freq: Every day | ORAL | 2 refills | Status: DC

## 2019-11-23 MED ORDER — "TUBERCULIN SYRINGE 27G X 1/2"" 1 ML MISC": 2 refills | Status: DC

## 2019-11-23 MED ORDER — METHOTREXATE SODIUM CHEMO INJECTION 50 MG/2ML: INTRAMUSCULAR | 0 refills | Status: DC

## 2019-11-23 NOTE — Telephone Encounter (Signed)
Labs: 11/20/2019 SPEP normal. IgA mildly elevated rest of immunoglobulins WNL. TB gold negative.   New start dose per office note on 11/20/2019: She will be starting on methotrexate 0.6 mL sq injections once weekly and if labs are stable in 2 weeks she will increase to 0.8 mL injections once weekly.  She will take folic acid 2 mg daily.   Please review and send prescriptions for methotrexate, folic acid and syringes.

## 2019-11-23 NOTE — Progress Notes (Signed)
TB gold is negative.

## 2019-12-01 ENCOUNTER — Other Ambulatory Visit: Payer: Self-pay | Admitting: Physician Assistant

## 2019-12-01 DIAGNOSIS — M0579 Rheumatoid arthritis with rheumatoid factor of multiple sites without organ or systems involvement: Secondary | ICD-10-CM

## 2019-12-11 DIAGNOSIS — Z79899 Other long term (current) drug therapy: Secondary | ICD-10-CM

## 2019-12-11 DIAGNOSIS — M0579 Rheumatoid arthritis with rheumatoid factor of multiple sites without organ or systems involvement: Secondary | ICD-10-CM

## 2019-12-11 MED ORDER — HYDROXYCHLOROQUINE SULFATE 200 MG PO TABS: 200.0000 mg | ORAL_TABLET | Freq: Two times a day (BID) | ORAL | 0 refills | Status: DC

## 2019-12-11 NOTE — Telephone Encounter (Signed)
Last Visit: 11/20/2019 Next Visit: 01/22/2020 Labs: 11/02/2019 Glucose is elevated 126. Rest of CMP within normal limits. CBC within normal limits. Eye exam: 04/12/2019 WNL with Pre-existing Toxo Scars  Current Dose per office note on 11/20/2019: Plaquenil 200 mg 1 tablet by mouth twice daily JS:UNHRVACQPE arthritis involving multiple sites with positive rheumatoid factor  Okay to refill Plaquenil?

## 2019-12-13 ENCOUNTER — Encounter: Payer: Self-pay | Admitting: Gastroenterology

## 2019-12-13 ENCOUNTER — Ambulatory Visit (INDEPENDENT_AMBULATORY_CARE_PROVIDER_SITE_OTHER): Payer: BC Managed Care – PPO | Admitting: Gastroenterology

## 2019-12-13 VITALS — BP 126/86 | HR 76 | Ht 67.75 in | Wt 265.4 lb

## 2019-12-13 DIAGNOSIS — K222 Esophageal obstruction: Secondary | ICD-10-CM

## 2019-12-13 DIAGNOSIS — D509 Iron deficiency anemia, unspecified: Secondary | ICD-10-CM

## 2019-12-13 DIAGNOSIS — R195 Other fecal abnormalities: Secondary | ICD-10-CM

## 2019-12-13 DIAGNOSIS — K2100 Gastro-esophageal reflux disease with esophagitis, without bleeding: Secondary | ICD-10-CM

## 2019-12-13 MED ORDER — CLENPIQ 10-3.5-12 MG-GM -GM/160ML PO SOLN: 1.0000 | Freq: Once | ORAL | 0 refills | Status: AC

## 2019-12-13 NOTE — Patient Instructions (Signed)
If you are age 56 or older, your body mass index should be between 23-30. Your Body mass index is 40.65 kg/m. If this is out of the aforementioned range listed, please consider follow up with your Primary Care Provider.  If you are age 30 or younger, your body mass index should be between 19-25. Your Body mass index is 40.65 kg/m. If this is out of the aformentioned range listed, please consider follow up with your Primary Care Provider.   You have been scheduled for a colonoscopy. Please follow written instructions given to you at your visit today.  Please pick up your prep supplies at the pharmacy within the next 1-3 days. If you use inhalers (even only as needed), please bring them with you on the day of your procedure.  We have sent the following medications to your pharmacy for you to pick up at your convenience: Clenpiq  It was a pleasure to see you today!  Vito Cirigliano, D.O.

## 2019-12-13 NOTE — Progress Notes (Signed)
P  Chief Complaint:    Heme positive stool, GERD with erosive esophagitis  GI History: 54 year old female initially referred to the GI clinic on 06/12/2019 for evaluation of solid food dysphagia x4 months.  Endoscopic Hx: - EGD (2013 for anemia): normal per patient - Colonoscopy (2013 for anemia): diverticulosis, o/w normal per patient -EGD (06/18/2019, Dr. [name]): LA Grade C esophagitis, stricture at 36 cm dilated with 20 mm TTS balloon, Hill grade 2, normal stomach/duodenum.  Started Protonix 40 mg BID -EGD (08/21/2019, Dr. [name]): Esophagitis resolved, mildly irregular Z-line (path:), Gastric inlet patch, Hill grade 2, mild gastritis, clinically polyps.  Reduced PPI  No known family history of CRC, GI malignancy, liver disease, pancreatic disease, or IBD.   HPI:     Patient is a 54 y.o. female presenting to the Gastroenterology Clinic for follow-up and evaluation of recent heme positive stool and iron deficiency without anemia.  -05/16/2019: Normal CBC -08/16/2019: Ferritin 7, iron 40, sat 8.2% -09/28/2019: Ferritin 4.3, iron 35, sat 6.9% -11/01/2019: FOBT positive -11/02/2019: Normal CBC, CMP  Today, she states she had hx of IDA in 2013 with Heme and GI eval at that time. Diagnosed with fibroids and menorrhagia and underwent hysterectomy and IV iron.   Started iron over the summer, and stools dark since then.Prior to that, no melena, hematochezia.   Recently started MTX for RA 2 weeks ago.  Otherwise, dysphagia has since resolved.  Reflux well controlled with Protonix 40 mg/day.   Review of systems:     No chest pain, no SOB, no fevers, no urinary sx   Past Medical History:  Diagnosis Date  . Allergy    SEASONAL  . Anemia   . Arthritis   . GERD (gastroesophageal reflux disease)   . Insomnia   . Rheumatoid arthritis ([place])     Patient's surgical history, family medical history, social history, medications and allergies were all reviewed in Epic    Current  Outpatient Medications  Medication Sig Dispense Refill  . amitriptyline (ELAVIL) 50 MG tablet Take 1 tablet (50 mg total) by mouth at bedtime as needed for sleep. 90 tablet 2  . cetirizine (ZYRTEC) 10 MG tablet Take 10 mg by mouth as needed for allergies.    Marland Kitchen diclofenac Sodium (VOLTAREN) 1 % GEL Apply topically as needed.     . fluticasone (FLONASE) 50 MCG/ACT nasal spray Place into both nostrils as needed for allergies or rhinitis.    . folic acid (FOLVITE) 1 MG tablet Take 2 tablets (2 mg total) by mouth daily. 180 tablet 2  . HYDROcodone-acetaminophen (NORCO/VICODIN) 5-325 MG tablet Take 1 tablet by mouth as needed for moderate pain.    . hydroxychloroquine (PLAQUENIL) 200 MG tablet Take 1 tablet (200 mg total) by mouth 2 (two) times daily. 180 tablet 0  . MELATONIN PO Take by mouth as needed.    . methotrexate 50 MG/2ML injection Inject 0.6 mL sq once weekly and if labs are stable in 2 weeks, increase to 0.8 mL once weekly 4 mL 0  . pantoprazole (PROTONIX) 40 MG tablet Take 1 tablet (40 mg total) by mouth 2 (two) times daily. (Patient taking differently: Take 40 mg by mouth daily. ) 90 tablet 3  . TUBERCULIN SYR 1CC/27GX1/2" 27G X 1/2" 1 ML MISC Use 1 syringe once weekly to inject methotrexate. 12 each 2  . betamethasone dipropionate 0.05 % cream SMARTSIG:1 Topical Every Night (Patient not taking: Reported on 12/13/2019)     No current facility-administered medications  for this visit.    Physical Exam:     BP 126/86   Pulse 76   Ht 5' 7.75" (1.721 m)   Wt 265 lb 6 oz (120.4 kg)   BMI 40.65 kg/m   GENERAL:  Pleasant female in NAD PSYCH: : Cooperative, normal affect Musculoskeletal:  Normal muscle tone, normal strength NEURO: Alert and oriented x 3, no focal neurologic deficits   IMPRESSION and PLAN:    1) Iron Deficiency 2) FOBT positive stool -Colonoscopy  3) GERD with Erosive Esophagtiis - Reflux well controlled -Continue Protonix at current dose -Continue antireflux  lifestyle/dietary modifications  4) Dysphagia 5) Esophageal stricture/Peptic stricture -Dysphagia resolved with esophageal dilation and PPI therapy  The indications, risks, and benefits of colonoscopy were explained to the patient in detail. Risks include but are not limited to bleeding, perforation, adverse reaction to medications, and cardiopulmonary compromise. Sequelae include but are not limited to the possibility of surgery, hospitalization, and mortality. The patient verbalized understanding and wished to proceed. All questions answered, referred to the scheduler and bowel prep ordered. Further recommendations pending results of the exam.            [name] ,DO, FACG 12/13/2019, 1:31 PM

## 2019-12-14 DIAGNOSIS — Z79899 Other long term (current) drug therapy: Secondary | ICD-10-CM | POA: Diagnosis not present

## 2019-12-15 LAB — CBC WITH DIFFERENTIAL/PLATELET
Absolute Monocytes: 572 cells/uL (ref 200–950)
Basophils Absolute: 18 cells/uL (ref 0–200)
Basophils Relative: 0.3 %
Eosinophils Absolute: 100 cells/uL (ref 15–500)
Eosinophils Relative: 1.7 %
HCT: 40.7 % (ref 35.0–45.0)
Hemoglobin: 13.4 g/dL (ref 11.7–15.5)
Lymphs Abs: 1493 cells/uL (ref 850–3900)
MCH: 27.9 pg (ref 27.0–33.0)
MCHC: 32.9 g/dL (ref 32.0–36.0)
MCV: 84.8 fL (ref 80.0–100.0)
MPV: 9.6 fL (ref 7.5–12.5)
Monocytes Relative: 9.7 %
Neutro Abs: 3717 cells/uL (ref 1500–7800)
Neutrophils Relative %: 63 %
Platelets: 244 10*3/uL (ref 140–400)
RBC: 4.8 10*6/uL (ref 3.80–5.10)
RDW: 16.1 % — ABNORMAL HIGH (ref 11.0–15.0)
Total Lymphocyte: 25.3 %
WBC: 5.9 10*3/uL (ref 3.8–10.8)

## 2019-12-15 LAB — COMPLETE METABOLIC PANEL WITH GFR
AG Ratio: 1.6 (calc) (ref 1.0–2.5)
ALT: 25 U/L (ref 6–29)
AST: 17 U/L (ref 10–35)
Albumin: 4 g/dL (ref 3.6–5.1)
Alkaline phosphatase (APISO): 67 U/L (ref 37–153)
BUN: 17 mg/dL (ref 7–25)
CO2: 28 mmol/L (ref 20–32)
Calcium: 9.3 mg/dL (ref 8.6–10.4)
Chloride: 107 mmol/L (ref 98–110)
Creat: 0.73 mg/dL (ref 0.50–1.05)
GFR, Est African American: 107 mL/min/{1.73_m2} (ref >=60)
GFR, Est Non African American: 92 mL/min/{1.73_m2} (ref >=60)
Globulin: 2.5 g/dL (calc) (ref 1.9–3.7)
Glucose, Bld: 103 mg/dL — ABNORMAL HIGH (ref 65–99)
Potassium: 4.1 mmol/L (ref 3.5–5.3)
Sodium: 143 mmol/L (ref 135–146)
Total Bilirubin: 0.4 mg/dL (ref 0.2–1.2)
Total Protein: 6.5 g/dL (ref 6.1–8.1)

## 2019-12-15 NOTE — Telephone Encounter (Signed)
CBC and CMP are normal.

## 2019-12-24 ENCOUNTER — Other Ambulatory Visit: Payer: Self-pay | Admitting: Rheumatology

## 2019-12-24 ENCOUNTER — Telehealth: Payer: Self-pay | Admitting: General Surgery

## 2019-12-24 NOTE — Telephone Encounter (Signed)
Last Visit: 11/20/2019  Next Visit: 01/22/2020 Labs: 12/14/2019 CBC and CMP are normal  Current Dose:  DX:  Rheumatoid arthritis involving multiple sites with positive rheumatoid factor   Attempted to contact the patient and left message to verify dose of MTX.

## 2019-12-24 NOTE — Telephone Encounter (Signed)
Left a voicemail for the patient stating her insurance will not cover Clenpiq and she should stop here at the [facility] office and pick up a sample. Asked her to call back to advise me of when she would be in.

## 2019-12-25 NOTE — Telephone Encounter (Signed)
Please let me know once you verify the dose.

## 2019-12-25 NOTE — Telephone Encounter (Signed)
Attempted to contact the patient and left message for patient to contact the office to verify dose of MTX.

## 2020-01-07 ENCOUNTER — Encounter: Payer: Self-pay | Admitting: Gastroenterology

## 2020-01-08 NOTE — Progress Notes (Deleted)
Office Visit Note  Patient: Brittany Malone             Date of Birth: [birth year]           MRN: [id number]             PCP: Shelda Pal, DO Referring: Shelda Pal* Visit Date: 01/22/2020 Occupation: @GUAROCC @  Subjective:  No chief complaint on file.   History of Present Illness: Brittany Malone is a 54 y.o. female ***   Activities of Daily Living:  Patient reports morning stiffness for *** {minute/hour:19697}.   Patient {ACTIONS;DENIES/REPORTS:21021675::"Denies"} nocturnal pain.  Difficulty dressing/grooming: {ACTIONS;DENIES/REPORTS:21021675::"Denies"} Difficulty climbing stairs: {ACTIONS;DENIES/REPORTS:21021675::"Denies"} Difficulty getting out of chair: {ACTIONS;DENIES/REPORTS:21021675::"Denies"} Difficulty using hands for taps, buttons, cutlery, and/or writing: {ACTIONS;DENIES/REPORTS:21021675::"Denies"}  No Rheumatology ROS completed.   PMFS History:  Patient Active Problem List   Diagnosis Date Noted  . Rheumatoid arthritis involving multiple sites with positive rheumatoid factor ([place]) 08/15/2018  . Family history of rheumatoid arthritis 08/15/2018  . Chronic uveitis of both eyes 08/15/2018  . Pes cavus 08/15/2018  . History of gastroesophageal reflux (GERD) 08/15/2018    Past Medical History:  Diagnosis Date  . Allergy    SEASONAL  . Anemia   . Arthritis   . GERD (gastroesophageal reflux disease)   . Insomnia   . Rheumatoid arthritis ([place])     Family History  Problem Relation Age of Onset  . Lupus Mother   . Fibromyalgia Mother   . Hyperlipidemia Mother   . Hypertension Father   . High Cholesterol Father   . Hyperlipidemia Father   . Colon polyps Father   . Kidney Stones Brother   . Healthy Son   . Hypertension Daughter   . Migraines Daughter   . Colon cancer Neg Hx   . Esophageal cancer Neg Hx   . Rectal cancer Neg Hx   . Stomach cancer Neg Hx    Past Surgical History:  Procedure Laterality Date  . ABDOMINAL  HYSTERECTOMY    . ANKLE SURGERY    . UPPER GI ENDOSCOPY     Social History   Social History Narrative  . Not on file   Immunization History  Administered Date(s) Administered  . Influenza,inj,Quad PF,6+ Mos 11/16/2018  . Moderna SARS-COVID-2 Vaccination 03/08/2019, 04/05/2019  . Pneumococcal Polysaccharide-23 11/16/2018  . Tdap 05/16/2010  . Zoster Recombinat (Shingrix) 11/16/2018, 01/16/2019     Objective: Vital Signs: There were no vitals taken for this visit.   Physical Exam   Musculoskeletal Exam: ***  CDAI Exam: CDAI Score: -- Patient Global: --; Provider Global: -- Swollen: --; Tender: -- Joint Exam 01/22/2020   No joint exam has been documented for this visit   There is currently no information documented on the homunculus. Go to the Rheumatology activity and complete the homunculus joint exam.  Investigation: No additional findings.  Imaging: No results found.  Recent Labs: Lab Results  Component Value Date   WBC 5.9 12/14/2019   HGB 13.4 12/14/2019   PLT 244 12/14/2019   NA 143 12/14/2019   K 4.1 12/14/2019   CL 107 12/14/2019   CO2 28 12/14/2019   GLUCOSE 103 (H) 12/14/2019   BUN 17 12/14/2019   CREATININE 0.73 12/14/2019   BILITOT 0.4 12/14/2019   ALKPHOS 68 08/16/2019   AST 17 12/14/2019   ALT 25 12/14/2019   PROT 6.5 12/14/2019   ALBUMIN 4.2 08/16/2019   CALCIUM 9.3 12/14/2019   GFRAA 107 12/14/2019   QFTBGOLDPLUS NEGATIVE 11/20/2019  Speciality Comments: PLQ Eye Exam: 04/12/2019 WNL with Pre-existing Toxo Scars @ [facility] Follow up in 1 year.  Procedures:  No procedures performed Allergies: Penicillins   Assessment / Plan:     Visit Diagnoses: Rheumatoid arthritis involving multiple sites with positive rheumatoid factor (HCC)  High risk medication use  Chronic uveitis of both eyes  Chronic SI joint pain  Pes cavus  Other fatigue  History of diverticulosis  History of gastroesophageal reflux  (GERD)  Cervical stenosis of spinal canal  History of melanoma  Family history of systemic lupus erythematosus (SLE) in mother  Orders: No orders of the defined types were placed in this encounter.  No orders of the defined types were placed in this encounter.   Face-to-face time spent with patient was *** minutes. Greater than 50% of time was spent in counseling and coordination of care.  Follow-Up Instructions: No follow-ups on file.   Ofilia Neas, PA-C  Note - This record has been created using Dragon software.  Chart creation errors have been sought, but may not always  have been located. Such creation errors do not reflect on  the standard of medical care.

## 2020-01-10 ENCOUNTER — Other Ambulatory Visit: Payer: Self-pay | Admitting: *Deleted

## 2020-01-10 DIAGNOSIS — Z79899 Other long term (current) drug therapy: Secondary | ICD-10-CM

## 2020-01-10 MED ORDER — METHOTREXATE SODIUM CHEMO INJECTION 50 MG/2ML
20.0000 mg | INTRAMUSCULAR | 0 refills | Status: DC
Start: 2020-01-10 — End: 2020-04-14

## 2020-01-10 NOTE — Telephone Encounter (Signed)
Patient can switch from pantoprazole to Pepcid it would be better as pantoprazole can increase methotrexate levels.

## 2020-01-10 NOTE — Telephone Encounter (Signed)
Patient advised if can please switch from pantoprazole to Pepcid it would be better as pantoprazole can increase methotrexate levels. Patient states she will reach out to her GI specialist and if they are in agreement she will make the change.

## 2020-01-10 NOTE — Telephone Encounter (Signed)
Last Visit: 11/20/2019  Next Visit: 01/22/2020 Labs: 12/14/2019 CBC and CMP are normal  Current Dose: verified with patient she is taking 0.8 mL weekly. DX: Rheumatoid arthritis involving multiple sites with positive rheumatoid factor   Patient updating labs today.   Okay to refill MTX?

## 2020-01-14 DIAGNOSIS — Z79899 Other long term (current) drug therapy: Secondary | ICD-10-CM | POA: Diagnosis not present

## 2020-01-15 LAB — CBC WITH DIFFERENTIAL/PLATELET
Absolute Monocytes: 456 cells/uL (ref 200–950)
Basophils Absolute: 19 cells/uL (ref 0–200)
Basophils Relative: 0.4 %
Eosinophils Absolute: 101 cells/uL (ref 15–500)
Eosinophils Relative: 2.1 %
HCT: 37.7 % (ref 35.0–45.0)
Hemoglobin: 12.8 g/dL (ref 11.7–15.5)
Lymphs Abs: 1205 cells/uL (ref 850–3900)
MCH: 29.8 pg (ref 27.0–33.0)
MCHC: 34 g/dL (ref 32.0–36.0)
MCV: 87.9 fL (ref 80.0–100.0)
MPV: 9.9 fL (ref 7.5–12.5)
Monocytes Relative: 9.5 %
Neutro Abs: 3019 cells/uL (ref 1500–7800)
Neutrophils Relative %: 62.9 %
Platelets: 217 10*3/uL (ref 140–400)
RBC: 4.29 10*6/uL (ref 3.80–5.10)
RDW: 15.5 % — ABNORMAL HIGH (ref 11.0–15.0)
Total Lymphocyte: 25.1 %
WBC: 4.8 10*3/uL (ref 3.8–10.8)

## 2020-01-15 LAB — COMPLETE METABOLIC PANEL WITH GFR
AG Ratio: 1.6 (calc) (ref 1.0–2.5)
ALT: 34 U/L — ABNORMAL HIGH (ref 6–29)
AST: 26 U/L (ref 10–35)
Albumin: 3.9 g/dL (ref 3.6–5.1)
Alkaline phosphatase (APISO): 55 U/L (ref 37–153)
BUN: 14 mg/dL (ref 7–25)
CO2: 29 mmol/L (ref 20–32)
Calcium: 9.1 mg/dL (ref 8.6–10.4)
Chloride: 108 mmol/L (ref 98–110)
Creat: 0.6 mg/dL (ref 0.50–1.05)
GFR, Est African American: 118 mL/min/{1.73_m2} (ref >=60)
GFR, Est Non African American: 102 mL/min/{1.73_m2} (ref >=60)
Globulin: 2.5 g/dL (calc) (ref 1.9–3.7)
Glucose, Bld: 97 mg/dL (ref 65–139)
Potassium: 4.1 mmol/L (ref 3.5–5.3)
Sodium: 143 mmol/L (ref 135–146)
Total Bilirubin: 0.3 mg/dL (ref 0.2–1.2)
Total Protein: 6.4 g/dL (ref 6.1–8.1)

## 2020-01-15 NOTE — Progress Notes (Signed)
Office Visit Note  Patient: Brittany Malone             Date of Birth: [birth year]           MRN: [id number]             PCP: Shelda Pal, DO Referring: Shelda Pal* Visit Date: 01/21/2020 Occupation: @GUAROCC @  Subjective:  Pain in both feet   History of Present Illness: Brittany Malone is a 54 y.o. female with history of seropositive rheumatoid arthritis and uveitis.  She is taking plaquenil 200 mg 1 tablet by mouth twice daily, MTX 0.8 ml sq injections once weekly, and folic acid 2 mg po daily. She was started on injectable MTX in october after her last visit in September 2021.  She has been tolerating MTX without any side effects or injection site reactions.  She has not noticed much improvement since starting on injectable MTX.  She continues to have pain in the left thumb, both feet, and left shoulder.  She denies any joint swelling.  She denies any uveitis flares recently.      Activities of Daily Living:  Patient reports morning stiffness for  30-45 minutes.   Patient Reports nocturnal pain.  Difficulty dressing/grooming: Denies Difficulty climbing stairs: Denies Difficulty getting out of chair: Denies Difficulty using hands for taps, buttons, cutlery, and/or writing: Denies  Review of Systems  Constitutional: Positive for fatigue.  HENT: Positive for mouth dryness. Negative for mouth sores and nose dryness.   Eyes: Negative for pain, visual disturbance and dryness.  Respiratory: Negative for cough, hemoptysis, shortness of breath and difficulty breathing.   Cardiovascular: Negative for chest pain, palpitations, hypertension and swelling in legs/feet.  Gastrointestinal: Negative for blood in stool, constipation and diarrhea.  Endocrine: Negative for increased urination.  Genitourinary: Negative for painful urination.  Musculoskeletal: Positive for arthralgias, joint pain, myalgias, morning stiffness, muscle tenderness and myalgias. Negative for joint  swelling and muscle weakness.  Skin: Negative for color change, pallor, rash, hair loss, nodules/bumps, skin tightness, ulcers and sensitivity to sunlight.  Allergic/Immunologic: Negative for susceptible to infections.  Neurological: Positive for headaches. Negative for dizziness, numbness and weakness.  Hematological: Negative for swollen glands.  Psychiatric/Behavioral: Positive for sleep disturbance. Negative for depressed mood. The patient is not nervous/anxious.     PMFS History:  Patient Active Problem List   Diagnosis Date Noted  . Rheumatoid arthritis involving multiple sites with positive rheumatoid factor ([place]) 08/15/2018  . Family history of rheumatoid arthritis 08/15/2018  . Chronic uveitis of both eyes 08/15/2018  . Pes cavus 08/15/2018  . History of gastroesophageal reflux (GERD) 08/15/2018    Past Medical History:  Diagnosis Date  . Allergy    SEASONAL  . Anemia   . Arthritis   . GERD (gastroesophageal reflux disease)   . Insomnia   . Rheumatoid arthritis ([place])     Family History  Problem Relation Age of Onset  . Lupus Mother   . Fibromyalgia Mother   . Hyperlipidemia Mother   . Hypertension Father   . High Cholesterol Father   . Hyperlipidemia Father   . Colon polyps Father   . Kidney Stones Brother   . Healthy Son   . Hypertension Daughter   . Migraines Daughter   . Colon cancer Neg Hx   . Esophageal cancer Neg Hx   . Rectal cancer Neg Hx   . Stomach cancer Neg Hx    Past Surgical History:  Procedure Laterality Date  . ABDOMINAL  HYSTERECTOMY    . ANKLE SURGERY    . COLONOSCOPY  01/18/2020  . UPPER GI ENDOSCOPY     Social History   Social History Narrative  . Not on file   Immunization History  Administered Date(s) Administered  . Influenza,inj,Quad PF,6+ Mos 11/16/2018  . Moderna SARS-COVID-2 Vaccination 03/08/2019, 04/05/2019, 11/23/2019  . Pneumococcal Polysaccharide-23 11/16/2018  . Tdap 05/16/2010  . Zoster Recombinat (Shingrix)  11/16/2018, 01/16/2019     Objective: Vital Signs: BP 131/77 (BP Location: Right Arm, Patient Position: Sitting, Cuff Size: Large)   Pulse 76   Resp 15   Ht 5' 7.75" (1.721 m)   Wt 269 lb (122 kg)   BMI 41.20 kg/m    Physical Exam Vitals and nursing note reviewed.  Constitutional:      Appearance: She is well-developed.  HENT:     Head: Normocephalic and atraumatic.  Eyes:     Conjunctiva/sclera: Conjunctivae normal.  Pulmonary:     Effort: Pulmonary effort is normal.  Abdominal:     Palpations: Abdomen is soft.  Musculoskeletal:     Cervical back: Normal range of motion.  Skin:    General: Skin is warm and dry.     Capillary Refill: Capillary refill takes less than 2 seconds.  Neurological:     Mental Status: She is alert and oriented to person, place, and time.  Psychiatric:        Behavior: Behavior normal.      Musculoskeletal Exam: C-spine limited ROM especially with lateral rotation to the right.  Thoracic and lumbar spine good ROM.  Shoulder joints, elbow joints, wrist joints, MCPs, PIPs, and DIPs good ROM with no synovitis.  Tenderness over the right 1st MCP joint.  Hip joints good ROM with no discomfort.  Knee joints good ROM with no warmth or effusion.  Ankle joints good ROM with no tenderness or inflammation.  Tenderness of right 2nd and 3rd MTPs and left 2nd-5th MTP joints.  Tenderness of the right 5th PIP joint.  Pes cavus noted bilaterally.    CDAI Exam: CDAI Score: 1.4  Patient Global: 2 mm; Provider Global: 2 mm Swollen: 0 ; Tender: 8  Joint Exam 01/21/2020      Right  Left  MCP 1      Tender  MTP 2   Tender   Tender  MTP 3   Tender   Tender  MTP 4      Tender  MTP 5      Tender  PIP 5 (toe)      Tender     Investigation: No additional findings.  Imaging: No results found.  Recent Labs: Lab Results  Component Value Date   WBC 4.8 01/14/2020   HGB 12.8 01/14/2020   PLT 217 01/14/2020   NA 143 01/14/2020   K 4.1 01/14/2020   CL 108  01/14/2020   CO2 29 01/14/2020   GLUCOSE 97 01/14/2020   BUN 14 01/14/2020   CREATININE 0.60 01/14/2020   BILITOT 0.3 01/14/2020   ALKPHOS 68 08/16/2019   AST 26 01/14/2020   ALT 34 (H) 01/14/2020   PROT 6.4 01/14/2020   ALBUMIN 4.2 08/16/2019   CALCIUM 9.1 01/14/2020   GFRAA 118 01/14/2020   QFTBGOLDPLUS NEGATIVE 11/20/2019    Speciality Comments: PLQ Eye Exam: 04/12/2019 WNL with Pre-existing Toxo Scars @ [facility] Follow up in 1 year.  Procedures:  No procedures performed Allergies: Penicillins   Assessment / Plan:     Visit Diagnoses: Rheumatoid  arthritis involving multiple sites with positive rheumatoid factor ([place]) - U/s feet 9/20: +synovitis: She has no synovitis on exam.  She has persistent pain in both feet.  Tenderness over the right 2nd and 3rd MTPs and left 2nd-5th MTP joints.  No inflammation was noted.  She is currently on plaquenil 200 mg 1 tablet by mouth by mouth twice daily, MTX 0.8 ml sq injections once weekly, and folic acid 2 mg po daily.  She started on MTX about 6 weeks ago but has not noticed any clinical improvement yet.  She is tolerating MTX without any side effects or injection site reactions.  X-rays of both feet were updated today. No baseline x-rays in epic.  Ultrasound of both feet performed on 11/29/18 showed mild doppler activity in left 1st and 3rd MTP joints. We discussed that if her discomfort persists or worsens we will repeat ultrasound of both feet to assess for synovitis.  She will continue on MTX and PLQ as prescribed. She does not need any refills at this time.  She will follow up in the office in 8 weeks to assess her response.    High risk medication use -Methotrexate 0.8 ml sq injections once weekly, folic acid 2 mg po daily, and plaquenil 200 mg 1 tablet by mouth twice daily.   CBC and CMP were updated on 01/14/20. She will be due to update lab work in February and every 3 months.  Standing orders for CBC and CMP are in place.    PLQ Eye Exam: 04/12/2019 WNL with Pre-existing Toxo Scars @ [facility] Follow up in 1 year. She has received all 3 covid-19 vaccine doses.  She was advised to hold MTX if she develops signs or symptoms of an infection and to resume once the infection has completely cleared.  She voiced understanding.   Chronic uveitis of both eyes: She is followed closely by her optometrist and retinal specialist.  She has not had any recent flares.  She has no eye pain, photophobia, or conjunctival injection on exam.  She was advised to notify us if she develops signs or symptoms of a flare.  She will continue on MTX as prescribed.   Chronic SI joint pain: Chronic pain  Pes cavus: Unchanged  Cervical stenosis of spinal canal: Injury-February 2021-Worker compensation case underway.  Updated MRI 09/27/19: Mild to moderate acquired spinal canal stenosis greatest at C5-6.  She is scheduled for a spinal nerve ablation soon.  She will continue to follow up with Dr. Nani Ravens.    Other fatigue: Stable   Other medical conditions are listed as follows:   History of diverticulosis  History of gastroesophageal reflux (GERD)  History of melanoma  Family history of systemic lupus erythematosus (SLE) in mother  Orders: Orders Placed This Encounter  Procedures  . XR Foot 2 Views Right  . XR Foot 2 Views Left   No orders of the defined types were placed in this encounter.     Follow-Up Instructions: Return in about 8 weeks (around 03/17/2020) for Rheumatoid arthritis, uveitis .   Ofilia Neas, PA-C  Note - This record has been created using Dragon software.  Chart creation errors have been sought, but may not always  have been located. Such creation errors do not reflect on  the standard of medical care.

## 2020-01-15 NOTE — Progress Notes (Signed)
ALT is mildly elevated, most likely due to methotrexate use.  We will continue to monitor rate.  CBC is normal.

## 2020-01-18 ENCOUNTER — Encounter: Payer: Self-pay | Admitting: Gastroenterology

## 2020-01-18 ENCOUNTER — Other Ambulatory Visit: Payer: Self-pay

## 2020-01-18 ENCOUNTER — Ambulatory Visit (AMBULATORY_SURGERY_CENTER): Payer: BC Managed Care – PPO | Admitting: Gastroenterology

## 2020-01-18 VITALS — BP 134/82 | HR 68 | Temp 98.6°F | Resp 12 | Ht 67.0 in | Wt 265.0 lb

## 2020-01-18 DIAGNOSIS — K6289 Other specified diseases of anus and rectum: Secondary | ICD-10-CM | POA: Diagnosis not present

## 2020-01-18 DIAGNOSIS — D125 Benign neoplasm of sigmoid colon: Secondary | ICD-10-CM

## 2020-01-18 DIAGNOSIS — D123 Benign neoplasm of transverse colon: Secondary | ICD-10-CM

## 2020-01-18 DIAGNOSIS — R195 Other fecal abnormalities: Secondary | ICD-10-CM | POA: Diagnosis not present

## 2020-01-18 DIAGNOSIS — K5730 Diverticulosis of large intestine without perforation or abscess without bleeding: Secondary | ICD-10-CM | POA: Diagnosis not present

## 2020-01-18 DIAGNOSIS — D509 Iron deficiency anemia, unspecified: Secondary | ICD-10-CM

## 2020-01-18 DIAGNOSIS — K640 First degree hemorrhoids: Secondary | ICD-10-CM | POA: Diagnosis not present

## 2020-01-18 DIAGNOSIS — Z1211 Encounter for screening for malignant neoplasm of colon: Secondary | ICD-10-CM | POA: Diagnosis not present

## 2020-01-18 HISTORY — PX: COLONOSCOPY: SHX174

## 2020-01-18 MED ORDER — SODIUM CHLORIDE 0.9 % IV SOLN: 500.0000 mL | INTRAVENOUS | Status: DC

## 2020-01-18 NOTE — Progress Notes (Signed)
Called to room to assist during endoscopic procedure.  Patient ID and intended procedure confirmed with present staff. Received instructions for my participation in the procedure from the performing physician.  

## 2020-01-18 NOTE — Op Note (Signed)
[place] Patient Name: Brittany Malone Procedure Date: 01/18/2020 1:11 PM MRN: [id number] Endoscopist: Gerrit Heck , MD Age: 54 Referring MD:  Date of Birth: 06-Feb-1966 Gender: Female Account #: [id number] Procedure:                Colonoscopy Indications:              Heme positive stool, Iron deficiency anemia Medicines:                Monitored Anesthesia Care Procedure:                Pre-Anesthesia Assessment:                           - Prior to the procedure, a History and Physical                            was performed, and patient medications and                            allergies were reviewed. The patient's tolerance of                            previous anesthesia was also reviewed. The risks                            and benefits of the procedure and the sedation                            options and risks were discussed with the patient.                            All questions were answered, and informed consent                            was obtained. Prior Anticoagulants: The patient has                            taken no previous anticoagulant or antiplatelet                            agents. ASA Grade Assessment: III - A patient with                            severe systemic disease. After reviewing the risks                            and benefits, the patient was deemed in                            satisfactory condition to undergo the procedure.                           After obtaining informed consent, the colonoscope  was passed under direct vision. Throughout the                            procedure, the patient's blood pressure, pulse, and                            oxygen saturations were monitored continuously. The                            Colonoscope was introduced through the anus and                            advanced to the the cecum, identified by                            appendiceal orifice  and ileocecal valve. The                            colonoscopy was performed without difficulty. The                            patient tolerated the procedure well. The quality                            of the bowel preparation was good. The ileocecal                            valve, appendiceal orifice, and rectum were                            photographed. Scope In: 1:28:48 PM Scope Out: 1:56:08 PM Scope Withdrawal Time: 0 hours 20 minutes 51 seconds  Total Procedure Duration: 0 hours 27 minutes 20 seconds  Findings:                 The perianal and digital rectal examinations were                            normal.                           Two sessile polyps were found in the sigmoid colon                            and transverse colon. The polyps were 4 to 5 mm in                            size. These polyps were removed with a cold snare.                            Resection and retrieval were complete. Estimated                            blood loss was minimal.  Multiple small and large-mouthed diverticula were                            found in the sigmoid colon and descending colon.                           Anal papilla(e) were hypertrophied.                           Non-bleeding internal hemorrhoids were found during                            retroflexion. The hemorrhoids were small. Complications:            No immediate complications. Estimated Blood Loss:     Estimated blood loss was minimal. Impression:               - Two 4 to 5 mm polyps in the sigmoid colon and in                            the transverse colon, removed with a cold snare.                            Resected and retrieved.                           - Diverticulosis in the sigmoid colon and in the                            descending colon.                           - Anal papilla(e) were hypertrophied.                           - Non-bleeding internal  hemorrhoids. Recommendation:           - Patient has a contact number available for                            emergencies. The signs and symptoms of potential                            delayed complications were discussed with the                            patient. Return to normal activities tomorrow.                            Written discharge instructions were provided to the                            patient.                           - The signs and symptoms of potential delayed  complications were discussed with the patient.                           - Patient has a contact number available for                            emergencies.                           - Resume previous diet.                           - Continue present medications.                           - Await pathology results.                           - Repeat colonoscopy for surveillance based on                            pathology results.                           - Return to GI clinic at appointment to be                            scheduled.                           - Repeat CBC and iron panel in 2 months. Gerrit Heck, MD 01/18/2020 2:05:23 PM

## 2020-01-18 NOTE — Patient Instructions (Signed)
Repeat CBC & Iron panel in 2 months    Handouts on polyps ,diverticulosis,& hemorrhoids given to you today  Await pathology results on polyps removed   Return to GI clinic appointment to be scheduled    YOU HAD AN ENDOSCOPIC PROCEDURE TODAY AT [place]:   Refer to the procedure report that was given to you for any specific questions about what was found during the examination.  If the procedure report does not answer your questions, please call your gastroenterologist to clarify.  If you requested that your care partner not be given the details of your procedure findings, then the procedure report has been included in a sealed envelope for you to review at your convenience later.  YOU SHOULD EXPECT: Some feelings of bloating in the abdomen. Passage of more gas than usual.  Walking can help get rid of the air that was put into your GI tract during the procedure and reduce the bloating. If you had a lower endoscopy (such as a colonoscopy or flexible sigmoidoscopy) you may notice spotting of blood in your stool or on the toilet paper. If you underwent a bowel prep for your procedure, you may not have a normal bowel movement for a few days.  Please Note:  You might notice some irritation and congestion in your nose or some drainage.  This is from the oxygen used during your procedure.  There is no need for concern and it should clear up in a day or so.  SYMPTOMS TO REPORT IMMEDIATELY:   Following lower endoscopy (colonoscopy or flexible sigmoidoscopy):  Excessive amounts of blood in the stool  Significant tenderness or worsening of abdominal pains  Swelling of the abdomen that is new, acute  Fever of 100F or higher    For urgent or emergent issues, a gastroenterologist can be reached at any hour by calling [phone]. Do not use MyChart messaging for urgent concerns.    DIET:  We do recommend a small meal at first, but then you may proceed to your regular diet.   Drink plenty of fluids but you should avoid alcoholic beverages for 24 hours.  ACTIVITY:  You should plan to take it easy for the rest of today and you should NOT DRIVE or use heavy machinery until tomorrow (because of the sedation medicines used during the test).    FOLLOW UP: Our staff will call the number listed on your records 48-72 hours following your procedure to check on you and address any questions or concerns that you may have regarding the information given to you following your procedure. If we do not reach you, we will leave a message.  We will attempt to reach you two times.  During this call, we will ask if you have developed any symptoms of COVID 19. If you develop any symptoms (ie: fever, flu-like symptoms, shortness of breath, cough etc.) before then, please call [phone].  If you test positive for Covid 19 in the 2 weeks post procedure, please call and report this information to [place].    If any biopsies were taken you will be contacted by phone or by letter within the next 1-3 weeks.  Please call us at [phone] if you have not heard about the biopsies in 3 weeks.    SIGNATURES/CONFIDENTIALITY: You and/or your care partner have signed paperwork which will be entered into your electronic medical record.  These signatures attest to the fact that that the information above on your After Visit  Summary has been reviewed and is understood.  Full responsibility of the confidentiality of this discharge information lies with you and/or your care-partner.

## 2020-01-18 NOTE — Progress Notes (Signed)
A and O x3. Report to RN. Tolerated MAC anesthesia well.

## 2020-01-21 ENCOUNTER — Ambulatory Visit: Payer: Self-pay

## 2020-01-21 ENCOUNTER — Ambulatory Visit (INDEPENDENT_AMBULATORY_CARE_PROVIDER_SITE_OTHER): Payer: BC Managed Care – PPO | Admitting: Physician Assistant

## 2020-01-21 ENCOUNTER — Encounter: Payer: Self-pay | Admitting: Physician Assistant

## 2020-01-21 VITALS — BP 131/77 | HR 76 | Resp 15 | Ht 67.75 in | Wt 269.0 lb

## 2020-01-21 DIAGNOSIS — M533 Sacrococcygeal disorders, not elsewhere classified: Secondary | ICD-10-CM | POA: Diagnosis not present

## 2020-01-21 DIAGNOSIS — R5383 Other fatigue: Secondary | ICD-10-CM

## 2020-01-21 DIAGNOSIS — M0579 Rheumatoid arthritis with rheumatoid factor of multiple sites without organ or systems involvement: Secondary | ICD-10-CM

## 2020-01-21 DIAGNOSIS — Z85820 Personal history of malignant melanoma of skin: Secondary | ICD-10-CM

## 2020-01-21 DIAGNOSIS — Z79899 Other long term (current) drug therapy: Secondary | ICD-10-CM

## 2020-01-21 DIAGNOSIS — G8929 Other chronic pain: Secondary | ICD-10-CM

## 2020-01-21 DIAGNOSIS — H2013 Chronic iridocyclitis, bilateral: Secondary | ICD-10-CM | POA: Diagnosis not present

## 2020-01-21 DIAGNOSIS — M4802 Spinal stenosis, cervical region: Secondary | ICD-10-CM

## 2020-01-21 DIAGNOSIS — M79671 Pain in right foot: Secondary | ICD-10-CM

## 2020-01-21 DIAGNOSIS — M79672 Pain in left foot: Secondary | ICD-10-CM | POA: Diagnosis not present

## 2020-01-21 DIAGNOSIS — Z8719 Personal history of other diseases of the digestive system: Secondary | ICD-10-CM

## 2020-01-21 DIAGNOSIS — Q667 Congenital pes cavus, unspecified foot: Secondary | ICD-10-CM

## 2020-01-21 DIAGNOSIS — Z8269 Family history of other diseases of the musculoskeletal system and connective tissue: Secondary | ICD-10-CM

## 2020-01-21 NOTE — Progress Notes (Signed)
I attempted to call the patient to review x-rays results.   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   Cystic changes noted in right 2nd and 3rd MTPs.  Post surgical changes in left foot noted.  She will notify us if her feet pain persists or worsens and we will schedule a sooner ultrasound to assess for synovitis.

## 2020-01-22 ENCOUNTER — Telehealth: Payer: Self-pay | Admitting: *Deleted

## 2020-01-22 ENCOUNTER — Ambulatory Visit: Payer: BC Managed Care – PPO | Admitting: Physician Assistant

## 2020-01-22 ENCOUNTER — Telehealth: Payer: Self-pay

## 2020-01-22 DIAGNOSIS — Z85820 Personal history of malignant melanoma of skin: Secondary | ICD-10-CM

## 2020-01-22 DIAGNOSIS — Z8719 Personal history of other diseases of the digestive system: Secondary | ICD-10-CM

## 2020-01-22 DIAGNOSIS — H2013 Chronic iridocyclitis, bilateral: Secondary | ICD-10-CM

## 2020-01-22 DIAGNOSIS — M4802 Spinal stenosis, cervical region: Secondary | ICD-10-CM

## 2020-01-22 DIAGNOSIS — G8929 Other chronic pain: Secondary | ICD-10-CM

## 2020-01-22 DIAGNOSIS — Z8269 Family history of other diseases of the musculoskeletal system and connective tissue: Secondary | ICD-10-CM

## 2020-01-22 DIAGNOSIS — M0579 Rheumatoid arthritis with rheumatoid factor of multiple sites without organ or systems involvement: Secondary | ICD-10-CM

## 2020-01-22 DIAGNOSIS — Q667 Congenital pes cavus, unspecified foot: Secondary | ICD-10-CM

## 2020-01-22 DIAGNOSIS — R5383 Other fatigue: Secondary | ICD-10-CM

## 2020-01-22 DIAGNOSIS — Z79899 Other long term (current) drug therapy: Secondary | ICD-10-CM

## 2020-01-22 NOTE — Telephone Encounter (Signed)
  Follow up Call-  Call back number 01/18/2020 08/21/2019 06/18/2019  Post procedure Call Back phone  # [phone]  Permission to leave phone message Yes Yes Yes  Some recent data might be hidden     Patient questions:    Do you have a fever, pain , or abdominal swelling? No. Pain Score  0 *  Have you tolerated food without any problems? Yes.    Have you been able to return to your normal activities? Yes.    Do you have any questions about your discharge instructions: Diet   No. Medications  No. Follow up visit  No.  Do you have questions or concerns about your Care? No.  Actions: * If pain score is 4 or above: No action needed, pain <4.  1. Have you developed a fever since your procedure? no  2.   Have you had an respiratory symptoms (SOB or cough) since your procedure? no  3.   Have you tested positive for COVID 19 since your procedure no  4.   Have you had any family members/close contacts diagnosed with the COVID 19 since your procedure?  no   If yes to any of these questions please route to Joylene John, RN and Joella Prince, RN

## 2020-01-22 NOTE — Telephone Encounter (Signed)
I returned the patient's call and discussed x-ray results from yesterday.  She will notify us if her feet pain persists or worsens.

## 2020-01-22 NOTE — Telephone Encounter (Signed)
Patient left a voicemail stating she missed a call from [facility] yesterday.  Patient states she wasn't able to answer because she was with a patient.

## 2020-01-30 ENCOUNTER — Other Ambulatory Visit: Payer: Self-pay | Admitting: Gastroenterology

## 2020-01-30 DIAGNOSIS — R195 Other fecal abnormalities: Secondary | ICD-10-CM

## 2020-01-30 DIAGNOSIS — D509 Iron deficiency anemia, unspecified: Secondary | ICD-10-CM

## 2020-01-31 ENCOUNTER — Encounter: Payer: Self-pay | Admitting: Gastroenterology

## 2020-02-17 ENCOUNTER — Other Ambulatory Visit: Payer: Self-pay | Admitting: Family Medicine

## 2020-03-06 NOTE — Progress Notes (Signed)
Office Visit Note  Patient: Brittany Malone             Date of Birth: 11/23/1965           MRN: [id number]             PCP: Shelda Pal, DO Referring: Shelda Pal* Visit Date: 03/19/2020 Occupation: @GUAROCC @  Subjective:  Medication Management   History of Present Illness: Brittany Malone is a 55 y.o. female with history of seropositive rheumatoid arthritis.  She states she continues to have discomfort in her hands and her feet.  She states her feet are painful especially at night.  She has not seen any visible swelling.  She has not had any uveitis flare in more than 10 years.  She does see a retinal specialist once a year.  She continues to have some neck and lower back pain.  Activities of Daily Living:  Patient reports morning stiffness for 20-30 minutes.   Patient Reports nocturnal pain.  Difficulty dressing/grooming: Denies Difficulty climbing stairs: Denies Difficulty getting out of chair: Denies Difficulty using hands for taps, buttons, cutlery, and/or writing: Denies  Review of Systems  Constitutional: Positive for fatigue. Negative for night sweats, weight gain and weight loss.  HENT: Positive for mouth dryness. Negative for mouth sores, trouble swallowing, trouble swallowing and nose dryness.   Eyes: Negative for pain, redness, itching, visual disturbance and dryness.  Respiratory: Negative for cough, shortness of breath and difficulty breathing.   Cardiovascular: Negative for chest pain, palpitations, hypertension, irregular heartbeat and swelling in legs/feet.  Gastrointestinal: Negative for blood in stool, constipation and diarrhea.  Endocrine: Negative for increased urination.  Genitourinary: Negative for difficulty urinating and vaginal dryness.  Musculoskeletal: Positive for arthralgias, joint pain, joint swelling and morning stiffness. Negative for myalgias, muscle weakness, muscle tenderness and myalgias.  Skin: Negative for color  change, rash, hair loss, redness, skin tightness, ulcers and sensitivity to sunlight.  Allergic/Immunologic: Negative for susceptible to infections.  Neurological: Positive for headaches. Negative for dizziness, numbness, memory loss, night sweats and weakness.  Hematological: Negative for bruising/bleeding tendency and swollen glands.  Psychiatric/Behavioral: Negative for depressed mood, confusion and sleep disturbance. The patient is not nervous/anxious.     PMFS History:  Patient Active Problem List   Diagnosis Date Noted  . Rheumatoid arthritis involving multiple sites with positive rheumatoid factor ([place]) 08/15/2018  . Family history of rheumatoid arthritis 08/15/2018  . Chronic uveitis of both eyes 08/15/2018  . Pes cavus 08/15/2018  . History of gastroesophageal reflux (GERD) 08/15/2018    Past Medical History:  Diagnosis Date  . Allergy    SEASONAL  . Anemia   . Arthritis   . GERD (gastroesophageal reflux disease)   . Insomnia   . Rheumatoid arthritis ([place])     Family History  Problem Relation Age of Onset  . Lupus Mother   . Fibromyalgia Mother   . Hyperlipidemia Mother   . Hypertension Father   . High Cholesterol Father   . Hyperlipidemia Father   . Colon polyps Father   . Kidney Stones Brother   . Healthy Son   . Hypertension Daughter   . Migraines Daughter   . Colon cancer Neg Hx   . Esophageal cancer Neg Hx   . Rectal cancer Neg Hx   . Stomach cancer Neg Hx    Past Surgical History:  Procedure Laterality Date  . ABDOMINAL HYSTERECTOMY    . ANKLE SURGERY    . COLONOSCOPY  01/18/2020  .  UPPER GI ENDOSCOPY     Social History   Social History Narrative  . Not on file   Immunization History  Administered Date(s) Administered  . Influenza,inj,Quad PF,6+ Mos 11/16/2018  . Moderna Sars-Covid-2 Vaccination 03/08/2019, 04/05/2019, 11/23/2019  . Pneumococcal Polysaccharide-23 11/16/2018  . Tdap 05/16/2010  . Zoster Recombinat (Shingrix) 11/16/2018,  01/16/2019     Objective: Vital Signs: BP 133/89 (BP Location: Left Wrist, Patient Position: Sitting, Cuff Size: Normal)   Pulse 85   Resp 16   Ht 5' 7.75" (1.721 m)   Wt 273 lb 12.8 oz (124.2 kg)   BMI 41.94 kg/m    Physical Exam Vitals and nursing note reviewed.  Constitutional:      Appearance: She is well-developed and well-nourished.  HENT:     Head: Normocephalic and atraumatic.  Eyes:     Extraocular Movements: EOM normal.     Conjunctiva/sclera: Conjunctivae normal.  Cardiovascular:     Rate and Rhythm: Normal rate and regular rhythm.     Pulses: Intact distal pulses.     Heart sounds: Normal heart sounds.  Pulmonary:     Effort: Pulmonary effort is normal.     Breath sounds: Normal breath sounds.  Abdominal:     General: Bowel sounds are normal.     Palpations: Abdomen is soft.  Musculoskeletal:     Cervical back: Normal range of motion.  Lymphadenopathy:     Cervical: No cervical adenopathy.  Skin:    General: Skin is warm and dry.     Capillary Refill: Capillary refill takes less than 2 seconds.  Neurological:     Mental Status: She is alert and oriented to person, place, and time.  Psychiatric:        Mood and Affect: Mood and affect normal.        Behavior: Behavior normal.       Musculoskeletal Exam: C-spine was in good range of motion.  Shoulder joints, elbow joints, wrist joints, MCPs PIPs and DIPs with good range of motion.  Hip joints, knee joints, ankles, MTPs and PIPs with good range of motion with no synovitis.  She had tenderness over left first MCP joint and bilateral second and third MTP joints.  CDAI Exam: CDAI Score: 1.4  Patient Global: 2 mm; Provider Global: 2 mm Swollen: 0 ; Tender: 5  Joint Exam 03/19/2020      Right  Left  MCP 1      Tender  MTP 2   Tender   Tender  MTP 3   Tender   Tender     Investigation: No additional findings.  Imaging: US COMPLETE JOINT SPACE STRUCTURES LOW BILAT  Result Date:  03/19/2020 Ultrasound examination of the bilateral feet was performed per EULAR recommendations. Using 15 MHz transducer, grayscale and power Doppler bilateral first,  second, third, and fifth MTP joints  were evaluated to look for synovitis or tenosynovitis. The findings were there was no synovitis or tenosynovitis on ultrasound examination. Impression: Ultrasound examination of bilateral feet did not show any synovitis or tenosynovitis.   Recent Labs: Lab Results  Component Value Date   WBC 4.8 01/14/2020   HGB 12.8 01/14/2020   PLT 217 01/14/2020   NA 143 01/14/2020   K 4.1 01/14/2020   CL 108 01/14/2020   CO2 29 01/14/2020   GLUCOSE 97 01/14/2020   BUN 14 01/14/2020   CREATININE 0.60 01/14/2020   BILITOT 0.3 01/14/2020   ALKPHOS 68 08/16/2019   AST 26 01/14/2020  ALT 34 (H) 01/14/2020   PROT 6.4 01/14/2020   ALBUMIN 4.2 08/16/2019   CALCIUM 9.1 01/14/2020   GFRAA 118 01/14/2020   QFTBGOLDPLUS NEGATIVE 11/20/2019    Speciality Comments: PLQ Eye Exam: 04/12/2019 WNL with Pre-existing Toxo Scars @ [facility] Follow up in 1 year.  Procedures:  No procedures performed Allergies: Penicillins   Assessment / Plan:     Visit Diagnoses: Rheumatoid arthritis involving multiple sites with positive rheumatoid factor (HCC) - [place] feet 09/20-synovitis.  Patient states she continues to have intermittent discomfort in her hands and feet.  She has not seen visible swelling.  She had tenderness on palpation of her left first MCP joint and bilateral second and third MTPs.  We decided to obtain ultrasound of bilateral feet which did not show any synovitis or tenosynovitis.  She is currently on methotrexate and Plaquenil combination which she is tolerating well.  High risk medication use - Methotrexate 0.8 ml sq injections once weekly, folic acid 2 mg po daily, and plaquenil 200 mg 1 tablet by mouth twice daily.  Labs performed on January 06, 2014 2021 were normal except for mild  elevation of LFTs.  We will continue to monitor labs every 3 months.  Avoidance of NSAIDs and weight loss was discussed.  Bilateral foot pain -she has been having discomfort in bilateral second and third MTP joints.  No synovitis was noted but she had tenderness on palpation.  Plan: [place] COMPLETE JOINT SPACE STRUCTURES LOW BILAT.  Ultrasound was unremarkable.  Have advised her to schedule an appointment with a podiatrist for orthotics.  Pes cavus-she has bilateral pes cavus which could be contributing to MTP discomfort.  Orthotics will be helpful.  Chronic SI joint pain - doing better.  Cervical stenosis of spinal canal - Injury-February 2021-Worker compensation case underway.  Mild to moderate spinal stenosis at C5-C6.  Followed by Dr. Carmelia Roller.  She continues to have discomfort.  Chronic midline low back pain without sciatica-she has chronic pain.  Chronic uveitis of both eyes - Followed by retinal specialist.last appointment 02/21. She will go once a year now. No active disease.  History of diverticulosis  History of gastroesophageal reflux (GERD)  History of melanoma  Family history of systemic lupus erythematosus (SLE) in mother  Orders: Orders Placed This Encounter  Procedures  . [place] COMPLETE JOINT SPACE STRUCTURES LOW BILAT   No orders of the defined types were placed in this encounter.    Follow-Up Instructions: Return in about 5 months (around 08/17/2020) for Rheumatoid arthritis.   Pollyann Savoy, MD  Note - This record has been created using Animal nutritionist.  Chart creation errors have been sought, but may not always  have been located. Such creation errors do not reflect on  the standard of medical care.

## 2020-03-19 ENCOUNTER — Encounter: Payer: Self-pay | Admitting: Rheumatology

## 2020-03-19 ENCOUNTER — Ambulatory Visit: Payer: Self-pay

## 2020-03-19 ENCOUNTER — Other Ambulatory Visit: Payer: Self-pay

## 2020-03-19 ENCOUNTER — Ambulatory Visit (INDEPENDENT_AMBULATORY_CARE_PROVIDER_SITE_OTHER): Payer: BC Managed Care – PPO | Admitting: Rheumatology

## 2020-03-19 VITALS — BP 133/89 | HR 85 | Resp 16 | Ht 67.75 in | Wt 273.8 lb

## 2020-03-19 DIAGNOSIS — M0579 Rheumatoid arthritis with rheumatoid factor of multiple sites without organ or systems involvement: Secondary | ICD-10-CM

## 2020-03-19 DIAGNOSIS — M79642 Pain in left hand: Secondary | ICD-10-CM

## 2020-03-19 DIAGNOSIS — M79641 Pain in right hand: Secondary | ICD-10-CM | POA: Diagnosis not present

## 2020-03-19 DIAGNOSIS — Q667 Congenital pes cavus, unspecified foot: Secondary | ICD-10-CM

## 2020-03-19 DIAGNOSIS — M79671 Pain in right foot: Secondary | ICD-10-CM | POA: Diagnosis not present

## 2020-03-19 DIAGNOSIS — M4802 Spinal stenosis, cervical region: Secondary | ICD-10-CM

## 2020-03-19 DIAGNOSIS — G8929 Other chronic pain: Secondary | ICD-10-CM

## 2020-03-19 DIAGNOSIS — H2013 Chronic iridocyclitis, bilateral: Secondary | ICD-10-CM

## 2020-03-19 DIAGNOSIS — Z79899 Other long term (current) drug therapy: Secondary | ICD-10-CM | POA: Diagnosis not present

## 2020-03-19 DIAGNOSIS — Z85820 Personal history of malignant melanoma of skin: Secondary | ICD-10-CM

## 2020-03-19 DIAGNOSIS — M79672 Pain in left foot: Secondary | ICD-10-CM

## 2020-03-19 DIAGNOSIS — Z8269 Family history of other diseases of the musculoskeletal system and connective tissue: Secondary | ICD-10-CM

## 2020-03-19 DIAGNOSIS — M545 Low back pain, unspecified: Secondary | ICD-10-CM

## 2020-03-19 DIAGNOSIS — M533 Sacrococcygeal disorders, not elsewhere classified: Secondary | ICD-10-CM

## 2020-03-19 DIAGNOSIS — Z8719 Personal history of other diseases of the digestive system: Secondary | ICD-10-CM

## 2020-03-19 NOTE — Patient Instructions (Signed)
Standing Labs We placed an order today for your standing lab work.   Please have your standing labs drawn in February and every 3 months    If possible, please have your labs drawn 2 weeks prior to your appointment so that the provider can discuss your results at your appointment.  We have open lab daily Monday through Thursday from 8:30-12:30 PM and 1:30-4:30 PM and Friday from 8:30-12:30 PM and 1:30-4:00 PM at the office of Dr. Brocha Gilliam, [facility].   Please be advised, patients with office appointments requiring lab work will take precedents over walk-in lab work.  If possible, please come for your lab work on Monday and Friday afternoons, as you may experience shorter wait times. The office is located at [address], [address], [place], [place] [address] No appointment is necessary.   Labs are drawn by [facility]. Please bring your co-pay at the time of your lab draw.  You may receive a bill from [facility] for your lab work.  If you wish to have your labs drawn at another location, please call the office 24 hours in advance to send orders.  If you have any questions regarding directions or hours of operation,  please call [phone].   As a reminder, please drink plenty of water prior to coming for your lab work. Thanks!   

## 2020-03-26 ENCOUNTER — Other Ambulatory Visit: Payer: Self-pay | Admitting: Physician Assistant

## 2020-03-26 DIAGNOSIS — M0579 Rheumatoid arthritis with rheumatoid factor of multiple sites without organ or systems involvement: Secondary | ICD-10-CM

## 2020-03-27 NOTE — Telephone Encounter (Signed)
Last Visit:01/21/2020  Next Visit: 6/22/02022 Labs: 01/14/2020, ALT is mildly elevated, most likely due to methotrexate use. We will continue to monitor rate. CBC is normal. Eye exam:  04/12/2019 WNL  Current Dose per office note  01/21/2020, plaquenil 200 mg 1 tablet by mouth twice daily DX: Rheumatoid arthritis involving multiple sites with positive rheumatoid factor  Okay to refill Plaquenil?

## 2020-04-13 ENCOUNTER — Other Ambulatory Visit: Payer: Self-pay | Admitting: Rheumatology

## 2020-04-14 ENCOUNTER — Other Ambulatory Visit: Payer: Self-pay | Admitting: *Deleted

## 2020-04-14 ENCOUNTER — Encounter: Payer: Self-pay | Admitting: Rheumatology

## 2020-04-14 DIAGNOSIS — Z79899 Other long term (current) drug therapy: Secondary | ICD-10-CM

## 2020-04-14 NOTE — Telephone Encounter (Signed)
Last Visit: 01/21/2020 Next Visit: 08/20/2020 Labs:  01/14/2020, ALT is mildly elevated, most likely due to methotrexate use. We will continue to monitor rate. CBC is normal.  Current Dose per office note 01/21/2020: Methotrexate 0.8 ml sq injections once weekly DX:  Rheumatoid arthritis involving multiple sites with positive rheumatoid factor   Last Fill: 01/10/2020  Left message to advise she is due to update labs.   Okay to refill MTX?

## 2020-04-15 DIAGNOSIS — Z79899 Other long term (current) drug therapy: Secondary | ICD-10-CM | POA: Diagnosis not present

## 2020-04-15 NOTE — Progress Notes (Signed)
CBC is normal.

## 2020-04-16 ENCOUNTER — Other Ambulatory Visit (INDEPENDENT_AMBULATORY_CARE_PROVIDER_SITE_OTHER): Payer: BC Managed Care – PPO

## 2020-04-16 DIAGNOSIS — D509 Iron deficiency anemia, unspecified: Secondary | ICD-10-CM

## 2020-04-16 DIAGNOSIS — R195 Other fecal abnormalities: Secondary | ICD-10-CM

## 2020-04-16 DIAGNOSIS — Z20822 Contact with and (suspected) exposure to covid-19: Secondary | ICD-10-CM | POA: Diagnosis not present

## 2020-04-16 LAB — COMPLETE METABOLIC PANEL WITH GFR
AG Ratio: 1.7 (calc) (ref 1.0–2.5)
ALT: 44 U/L — ABNORMAL HIGH (ref 6–29)
AST: 35 U/L (ref 10–35)
Albumin: 4.3 g/dL (ref 3.6–5.1)
Alkaline phosphatase (APISO): 79 U/L (ref 37–153)
BUN: 19 mg/dL (ref 7–25)
CO2: 29 mmol/L (ref 20–32)
Calcium: 9.4 mg/dL (ref 8.6–10.4)
Chloride: 106 mmol/L (ref 98–110)
Creat: 0.66 mg/dL (ref 0.50–1.05)
GFR, Est African American: 114 mL/min/{1.73_m2} (ref >=60)
GFR, Est Non African American: 99 mL/min/{1.73_m2} (ref >=60)
Globulin: 2.6 g/dL (calc) (ref 1.9–3.7)
Glucose, Bld: 108 mg/dL (ref 65–139)
Potassium: 4.2 mmol/L (ref 3.5–5.3)
Sodium: 142 mmol/L (ref 135–146)
Total Bilirubin: 0.3 mg/dL (ref 0.2–1.2)
Total Protein: 6.9 g/dL (ref 6.1–8.1)

## 2020-04-16 LAB — CBC WITH DIFFERENTIAL/PLATELET
Absolute Monocytes: 539 cells/uL (ref 200–950)
Basophils Absolute: 41 cells/uL (ref 0–200)
Basophils Relative: 0.7 %
Eosinophils Absolute: 99 cells/uL (ref 15–500)
Eosinophils Relative: 1.7 %
HCT: 40 % (ref 35.0–45.0)
Hemoglobin: 13.8 g/dL (ref 11.7–15.5)
Lymphs Abs: 1131 cells/uL (ref 850–3900)
MCH: 31.2 pg (ref 27.0–33.0)
MCHC: 34.5 g/dL (ref 32.0–36.0)
MCV: 90.5 fL (ref 80.0–100.0)
MPV: 9.8 fL (ref 7.5–12.5)
Monocytes Relative: 9.3 %
Neutro Abs: 3990 cells/uL (ref 1500–7800)
Neutrophils Relative %: 68.8 %
Platelets: 231 10*3/uL (ref 140–400)
RBC: 4.42 10*6/uL (ref 3.80–5.10)
RDW: 12.3 % (ref 11.0–15.0)
Total Lymphocyte: 19.5 %
WBC: 5.8 10*3/uL (ref 3.8–10.8)

## 2020-04-16 LAB — IBC + FERRITIN
Ferritin: 94.8 ng/mL (ref 10.0–291.0)
Iron: 116 ug/dL (ref 42–145)
Saturation Ratios: 32.6 % (ref 20.0–50.0)
Transferrin: 254 mg/dL (ref 212.0–360.0)

## 2020-04-16 LAB — CBC
HCT: 40.1 % (ref 36.0–46.0)
Hemoglobin: 14 g/dL (ref 12.0–15.0)
MCHC: 34.9 g/dL (ref 30.0–36.0)
MCV: 89.8 fl (ref 78.0–100.0)
Platelets: 249 10*3/uL (ref 150.0–400.0)
RBC: 4.47 Mil/uL (ref 3.87–5.11)
RDW: 13.1 % (ref 11.5–15.5)
WBC: 6 10*3/uL (ref 4.0–10.5)

## 2020-04-16 NOTE — Progress Notes (Signed)
CBC is normal.  ALT is mildly elevated at 44.  Please recommend to avoid all NSAIDs and Tylenol.

## 2020-05-05 ENCOUNTER — Other Ambulatory Visit: Payer: Self-pay | Admitting: Physician Assistant

## 2020-05-05 NOTE — Telephone Encounter (Signed)
Last Visit: 01/21/2020 Next Visit: 08/20/2020 Labs: 04/15/2020, CBC is normal. ALT is mildly elevated at 44. Please recommend to avoid all NSAIDs and Tylenol.  Current Dose per office note 01/21/2020, -Methotrexate 0.8 ml sq injections once weekly, DX:  Rheumatoid arthritis involving multiple sites with positive rheumatoid factor   Last Fill: 04/14/2020, 4 ml  Okay to refill MTX?

## 2020-05-12 DIAGNOSIS — M25562 Pain in left knee: Secondary | ICD-10-CM | POA: Diagnosis not present

## 2020-05-12 DIAGNOSIS — M7062 Trochanteric bursitis, left hip: Secondary | ICD-10-CM | POA: Diagnosis not present

## 2020-05-12 DIAGNOSIS — M1712 Unilateral primary osteoarthritis, left knee: Secondary | ICD-10-CM | POA: Diagnosis not present

## 2020-05-29 DIAGNOSIS — Z1231 Encounter for screening mammogram for malignant neoplasm of breast: Secondary | ICD-10-CM | POA: Diagnosis not present

## 2020-06-02 DIAGNOSIS — Z79899 Other long term (current) drug therapy: Secondary | ICD-10-CM | POA: Diagnosis not present

## 2020-06-02 DIAGNOSIS — M069 Rheumatoid arthritis, unspecified: Secondary | ICD-10-CM | POA: Diagnosis not present

## 2020-06-02 DIAGNOSIS — B5801 Toxoplasma chorioretinitis: Secondary | ICD-10-CM | POA: Diagnosis not present

## 2020-06-02 DIAGNOSIS — H31093 Other chorioretinal scars, bilateral: Secondary | ICD-10-CM | POA: Diagnosis not present

## 2020-06-08 DIAGNOSIS — K5732 Diverticulitis of large intestine without perforation or abscess without bleeding: Secondary | ICD-10-CM | POA: Diagnosis not present

## 2020-06-08 DIAGNOSIS — R112 Nausea with vomiting, unspecified: Secondary | ICD-10-CM | POA: Diagnosis not present

## 2020-06-08 DIAGNOSIS — R509 Fever, unspecified: Secondary | ICD-10-CM | POA: Diagnosis not present

## 2020-06-08 DIAGNOSIS — R109 Unspecified abdominal pain: Secondary | ICD-10-CM | POA: Diagnosis not present

## 2020-06-16 ENCOUNTER — Ambulatory Visit (INDEPENDENT_AMBULATORY_CARE_PROVIDER_SITE_OTHER): Payer: BC Managed Care – PPO | Admitting: Family Medicine

## 2020-06-16 ENCOUNTER — Encounter: Payer: Self-pay | Admitting: Family Medicine

## 2020-06-16 ENCOUNTER — Other Ambulatory Visit: Payer: Self-pay

## 2020-06-16 VITALS — BP 136/82 | HR 66 | Temp 97.5°F | Ht 67.5 in | Wt 262.2 lb

## 2020-06-16 DIAGNOSIS — Z23 Encounter for immunization: Secondary | ICD-10-CM

## 2020-06-16 DIAGNOSIS — R7989 Other specified abnormal findings of blood chemistry: Secondary | ICD-10-CM

## 2020-06-16 DIAGNOSIS — Z0000 Encounter for general adult medical examination without abnormal findings: Secondary | ICD-10-CM

## 2020-06-16 LAB — LIPID PANEL
Cholesterol: 127 mg/dL (ref 0–200)
HDL: 60 mg/dL (ref >39.00)
LDL Cholesterol: 51 mg/dL (ref 0–99)
NonHDL: 67.05
Total CHOL/HDL Ratio: 2
Triglycerides: 80 mg/dL (ref 0.0–149.0)
VLDL: 16 mg/dL (ref 0.0–40.0)

## 2020-06-16 LAB — COMPREHENSIVE METABOLIC PANEL
ALT: 57 U/L — ABNORMAL HIGH (ref 0–35)
AST: 50 U/L — ABNORMAL HIGH (ref 0–37)
Albumin: 3.8 g/dL (ref 3.5–5.2)
Alkaline Phosphatase: 53 U/L (ref 39–117)
BUN: 13 mg/dL (ref 6–23)
CO2: 27 mEq/L (ref 19–32)
Calcium: 9.3 mg/dL (ref 8.4–10.5)
Chloride: 107 mEq/L (ref 96–112)
Creatinine, Ser: 0.68 mg/dL (ref 0.40–1.20)
GFR: 97.03 mL/min (ref >60.00)
Glucose, Bld: 92 mg/dL (ref 70–99)
Potassium: 4.2 mEq/L (ref 3.5–5.1)
Sodium: 142 mEq/L (ref 135–145)
Total Bilirubin: 0.4 mg/dL (ref 0.2–1.2)
Total Protein: 6.4 g/dL (ref 6.0–8.3)

## 2020-06-16 LAB — HEMOGLOBIN A1C: Hgb A1c MFr Bld: 5.4 % (ref 4.6–6.5)

## 2020-06-16 MED ORDER — AMITRIPTYLINE HCL 50 MG PO TABS: 50.0000 mg | ORAL_TABLET | Freq: Every evening | ORAL | 2 refills | Status: DC | PRN

## 2020-06-16 NOTE — Patient Instructions (Signed)
Give us 2-3 business days to get the results of your labs back.   Keep the diet clean and stay active.  Let us know if you need anything. 

## 2020-06-16 NOTE — Progress Notes (Signed)
Chief Complaint  Patient presents with  . Annual Exam     Well Woman Brittany Malone is here for a complete physical.   Her last physical was >1 year ago.  Current diet: in general, diet is fair. Current exercise: walking. Has had knee pain.  Weight is fluctuating and she denies fatigue out of ordinary. Seatbelt? Yes Loss of interested in doing things or depression in past 2 weeks? No  Health Maintenance Mammogram-Yes Colon cancer screening-Yes Shingrix- Yes Tetanus- No Hep C screening- Yes HIV screening- Yes  Past Medical History:  Diagnosis Date  . Allergy    SEASONAL  . Anemia   . Arthritis   . GERD (gastroesophageal reflux disease)   . Insomnia   . Rheumatoid arthritis [facility])      Past Surgical History:  Procedure Laterality Date  . ABDOMINAL HYSTERECTOMY    . ANKLE SURGERY    . COLONOSCOPY  01/18/2020  . UPPER GI ENDOSCOPY      Medications  Current Outpatient Medications on File Prior to Visit  Medication Sig Dispense Refill  . acetaminophen (TYLENOL) 325 MG tablet Take 650 mg by mouth every 6 (six) hours as needed.    Marland Kitchen amitriptyline (ELAVIL) 50 MG tablet TAKE 1 TABLET (50 MG TOTAL) BY MOUTH AT BEDTIME AS NEEDED FOR SLEEP. 90 tablet 0  . cetirizine (ZYRTEC) 10 MG tablet Take 10 mg by mouth as needed for allergies.    . ciprofloxacin (CIPRO) 500 MG tablet Take 1 tablet by mouth 2 (two) times daily.    . ferrous sulfate 325 (65 FE) MG tablet Take by mouth.    . fluticasone (FLONASE) 50 MCG/ACT nasal spray Place into both nostrils as needed for allergies or rhinitis.    . folic acid (FOLVITE) 1 MG tablet Take 2 tablets (2 mg total) by mouth daily. 180 tablet 2  . HYDROcodone-acetaminophen (NORCO/VICODIN) 5-325 MG tablet Take 1 tablet by mouth as needed for moderate pain.    . hydroxychloroquine (PLAQUENIL) 200 MG tablet TAKE 1 TABLET BY MOUTH TWICE A DAY 180 tablet 0  . MELATONIN PO Take by mouth as needed.    . methotrexate 50 MG/2ML injection INJECT 0.8 MLS  (20 MG TOTAL) INTO THE SKIN ONCE A WEEK. 10 mL 0  . metroNIDAZOLE (FLAGYL) 500 MG tablet Take 1 tablet by mouth 2 (two) times daily.    . pantoprazole (PROTONIX) 40 MG tablet Take 1 tablet (40 mg total) by mouth 2 (two) times daily. (Patient taking differently: Take 40 mg by mouth daily.) 90 tablet 3  . TUBERCULIN SYR 1CC/27GX1/2" 27G X 1/2" 1 ML MISC Use 1 syringe once weekly to inject methotrexate. 12 each 2   Allergies Allergies  Allergen Reactions  . Penicillins Anaphylaxis    Review of Systems: Constitutional:  no unexpected weight changes Eye:  no recent significant change in vision Ear/Nose/Mouth/Throat:  Ears:  no recent change in hearing Nose/Mouth/Throat:  no complaints of nasal congestion, no sore throat Cardiovascular: no chest pain Respiratory:  no shortness of breath Gastrointestinal:  no abdominal pain, no change in bowel habits GU:  Female: negative for dysuria or pelvic pain Musculoskeletal/Extremities:  no new pain of the joints Integumentary (Skin/Breast):  no abnormal skin lesions reported Neurologic:  no headaches Endocrine:  denies fatigue  Exam BP 136/82 (BP Location: Left Arm, Patient Position: Sitting, Cuff Size: Large)   Pulse 66   Temp (!) 97.5 F (36.4 C) (Oral)   Ht 5' 7.5" (1.715 m)   Wt 262  lb 4 oz (119 kg)   SpO2 98%   BMI 40.47 kg/m  General:  well developed, well nourished, in no apparent distress Skin:  no significant moles, warts, or growths Head:  no masses, lesions, or tenderness Eyes:  pupils equal and round, sclera anicteric without injection Ears:  canals without lesions, TMs shiny without retraction, no obvious effusion, no erythema Nose:  nares patent, septum midline, mucosa normal, and no drainage or sinus tenderness Throat/Pharynx:  lips and gingiva without lesion; tongue and uvula midline; non-inflamed pharynx; no exudates or postnasal drainage Neck: neck supple without adenopathy, thyromegaly, or masses Lungs:  clear to  auscultation, breath sounds equal bilaterally, no respiratory distress Cardio:  regular rate and rhythm, no LE edema Abdomen:  abdomen soft, nontender; bowel sounds normal; no masses or organomegaly Genital: Defer to GYN Musculoskeletal:  symmetrical muscle groups noted without atrophy or deformity Extremities:  no clubbing, cyanosis, or edema, no deformities, no skin discoloration Neuro:  gait normal; deep tendon reflexes normal and symmetric Psych: well oriented with normal range of affect and appropriate judgment/insight  Assessment and Plan  Well adult exam - Plan: Comprehensive metabolic panel, Hemoglobin A1c, Lipid panel  Morbid obesity (HCC) - Plan: Amb Ref to Medical Weight Management   Well 55 y.o. female. Counseled on diet and exercise. Other orders as above. Tdap today.  Follow up in 6 mo or prn. The patient voiced understanding and agreement to the plan.  [place], DO 06/16/20 9:17 AM

## 2020-06-16 NOTE — Addendum Note (Signed)
Addended by: Sharon Seller B on: 06/16/2020 09:23 AM   Modules accepted: Orders

## 2020-06-17 DIAGNOSIS — M7662 Achilles tendinitis, left leg: Secondary | ICD-10-CM | POA: Diagnosis not present

## 2020-06-17 DIAGNOSIS — M722 Plantar fascial fibromatosis: Secondary | ICD-10-CM | POA: Diagnosis not present

## 2020-06-17 DIAGNOSIS — M6702 Short Achilles tendon (acquired), left ankle: Secondary | ICD-10-CM | POA: Diagnosis not present

## 2020-06-19 DIAGNOSIS — M25562 Pain in left knee: Secondary | ICD-10-CM | POA: Diagnosis not present

## 2020-06-23 ENCOUNTER — Encounter: Payer: Self-pay | Admitting: Family Medicine

## 2020-06-23 ENCOUNTER — Ambulatory Visit (HOSPITAL_BASED_OUTPATIENT_CLINIC_OR_DEPARTMENT_OTHER)
Admission: RE | Admit: 2020-06-23 | Discharge: 2020-06-23 | Disposition: A | Discharge status: Home / Self Care | Payer: BC Managed Care – PPO | Source: Ambulatory Visit | Attending: Family Medicine | Admitting: Family Medicine

## 2020-06-23 ENCOUNTER — Other Ambulatory Visit: Payer: Self-pay

## 2020-06-23 DIAGNOSIS — K760 Fatty (change of) liver, not elsewhere classified: Secondary | ICD-10-CM | POA: Insufficient documentation

## 2020-06-23 DIAGNOSIS — M069 Rheumatoid arthritis, unspecified: Secondary | ICD-10-CM | POA: Insufficient documentation

## 2020-06-23 DIAGNOSIS — K219 Gastro-esophageal reflux disease without esophagitis: Secondary | ICD-10-CM | POA: Insufficient documentation

## 2020-06-23 DIAGNOSIS — R7989 Other specified abnormal findings of blood chemistry: Secondary | ICD-10-CM | POA: Insufficient documentation

## 2020-07-11 DIAGNOSIS — M1712 Unilateral primary osteoarthritis, left knee: Secondary | ICD-10-CM | POA: Diagnosis not present

## 2020-07-14 ENCOUNTER — Other Ambulatory Visit: Payer: Self-pay | Admitting: Physician Assistant

## 2020-07-14 DIAGNOSIS — M0579 Rheumatoid arthritis with rheumatoid factor of multiple sites without organ or systems involvement: Secondary | ICD-10-CM

## 2020-07-15 NOTE — Telephone Encounter (Signed)
Last Visit: 01/21/2020 Next Visit: 08/20/2020 Labs: 06/16/2020, AST 50, ALT 57, 06/08/2020  Eye exam:  04/12/2019, Appt 04/2020 per patient, patient will fax PLQ eye exam faxed to office  Current Dose per office note 01/21/2020, plaquenil 200 mg 1 tablet by mouth twice daily  DX: Rheumatoid arthritis involving multiple sites with positive rheumatoid factor   Last Fill: 03/27/2020  Okay to refill Plaquenil?

## 2020-08-06 NOTE — Progress Notes (Deleted)
Office Visit Note  Patient: Brittany Malone             Date of Birth: [birth year]           MRN: [id number]             PCP: Shelda Pal, DO Referring: Shelda Pal* Visit Date: 08/20/2020 Occupation: @GUAROCC @  Subjective:  No chief complaint on file.   History of Present Illness: Brittany Malone is a 55 y.o. female ***   Activities of Daily Living:  Patient reports morning stiffness for *** {minute/hour:19697}.   Patient {ACTIONS;DENIES/REPORTS:21021675::"Denies"} nocturnal pain.  Difficulty dressing/grooming: {ACTIONS;DENIES/REPORTS:21021675::"Denies"} Difficulty climbing stairs: {ACTIONS;DENIES/REPORTS:21021675::"Denies"} Difficulty getting out of chair: {ACTIONS;DENIES/REPORTS:21021675::"Denies"} Difficulty using hands for taps, buttons, cutlery, and/or writing: {ACTIONS;DENIES/REPORTS:21021675::"Denies"}  No Rheumatology ROS completed.   PMFS History:  Patient Active Problem List   Diagnosis Date Noted  . Rheumatoid arthritis ([place])   . NAFLD (nonalcoholic fatty liver disease)   . GERD (gastroesophageal reflux disease)   . Rheumatoid arthritis involving multiple sites with positive rheumatoid factor ([place]) 08/15/2018  . Family history of rheumatoid arthritis 08/15/2018  . Chronic uveitis of both eyes 08/15/2018  . Pes cavus 08/15/2018  . History of gastroesophageal reflux (GERD) 08/15/2018    Past Medical History:  Diagnosis Date  . Allergy    SEASONAL  . Anemia   . Arthritis   . GERD (gastroesophageal reflux disease)   . Insomnia   . NAFLD (nonalcoholic fatty liver disease)   . Rheumatoid arthritis ([place])     Family History  Problem Relation Age of Onset  . Lupus Mother   . Fibromyalgia Mother   . Hyperlipidemia Mother   . Hypertension Father   . High Cholesterol Father   . Hyperlipidemia Father   . Colon polyps Father   . Kidney Stones Brother   . Healthy Son   . Hypertension Daughter   . Migraines Daughter   . Colon cancer  Neg Hx   . Esophageal cancer Neg Hx   . Rectal cancer Neg Hx   . Stomach cancer Neg Hx    Past Surgical History:  Procedure Laterality Date  . ABDOMINAL HYSTERECTOMY    . ANKLE SURGERY    . COLONOSCOPY  01/18/2020  . UPPER GI ENDOSCOPY     Social History   Social History Narrative  . Not on file   Immunization History  Administered Date(s) Administered  . Influenza,inj,Quad PF,6+ Mos 11/16/2018  . Moderna Sars-Covid-2 Vaccination 03/08/2019, 04/05/2019, 11/23/2019  . Pneumococcal Polysaccharide-23 11/16/2018  . Tdap 05/16/2010, 06/16/2020  . Zoster Recombinat (Shingrix) 11/16/2018, 01/16/2019     Objective: Vital Signs: There were no vitals taken for this visit.   Physical Exam   Musculoskeletal Exam: ***  CDAI Exam: CDAI Score: -- Patient Global: --; Provider Global: -- Swollen: --; Tender: -- Joint Exam 08/20/2020   No joint exam has been documented for this visit   There is currently no information documented on the homunculus. Go to the Rheumatology activity and complete the homunculus joint exam.  Investigation: No additional findings.  Imaging: No results found.  Recent Labs: Lab Results  Component Value Date   WBC 6.0 04/16/2020   HGB 14.0 04/16/2020   PLT 249.0 04/16/2020   NA 142 06/16/2020   K 4.2 06/16/2020   CL 107 06/16/2020   CO2 27 06/16/2020   GLUCOSE 92 06/16/2020   BUN 13 06/16/2020   CREATININE 0.68 06/16/2020   BILITOT 0.4 06/16/2020   ALKPHOS 53 06/16/2020  AST 50 (H) 06/16/2020   ALT 57 (H) 06/16/2020   PROT 6.4 06/16/2020   ALBUMIN 3.8 06/16/2020   CALCIUM 9.3 06/16/2020   GFRAA 114 04/15/2020   QFTBGOLDPLUS NEGATIVE 11/20/2019    Speciality Comments: PLQ Eye Exam: 06/02/2020 WNL with Pre-existing Toxo Scars @ [facility] Follow up in 1 year.  Procedures:  No procedures performed Allergies: Penicillins   Assessment / Plan:     Visit Diagnoses: No diagnosis found.  Orders: No orders of the defined  types were placed in this encounter.  No orders of the defined types were placed in this encounter.   Face-to-face time spent with patient was *** minutes. Greater than 50% of time was spent in counseling and coordination of care.  Follow-Up Instructions: No follow-ups on file.   Earnestine Mealing, CMA  Note - This record has been created using Editor, commissioning.  Chart creation errors have been sought, but may not always  have been located. Such creation errors do not reflect on  the standard of medical care.

## 2020-08-12 ENCOUNTER — Ambulatory Visit (INDEPENDENT_AMBULATORY_CARE_PROVIDER_SITE_OTHER): Payer: BC Managed Care – PPO | Admitting: Family Medicine

## 2020-08-12 ENCOUNTER — Other Ambulatory Visit: Payer: Self-pay

## 2020-08-12 ENCOUNTER — Encounter (INDEPENDENT_AMBULATORY_CARE_PROVIDER_SITE_OTHER): Payer: Self-pay | Admitting: Family Medicine

## 2020-08-12 VITALS — BP 121/81 | HR 68 | Temp 97.7°F | Ht 68.0 in | Wt 259.0 lb

## 2020-08-12 DIAGNOSIS — R0602 Shortness of breath: Secondary | ICD-10-CM

## 2020-08-12 DIAGNOSIS — R5383 Other fatigue: Secondary | ICD-10-CM | POA: Diagnosis not present

## 2020-08-12 DIAGNOSIS — D508 Other iron deficiency anemias: Secondary | ICD-10-CM

## 2020-08-12 DIAGNOSIS — Z1331 Encounter for screening for depression: Secondary | ICD-10-CM | POA: Diagnosis not present

## 2020-08-12 DIAGNOSIS — M069 Rheumatoid arthritis, unspecified: Secondary | ICD-10-CM | POA: Diagnosis not present

## 2020-08-12 DIAGNOSIS — Z6839 Body mass index (BMI) 39.0-39.9, adult: Secondary | ICD-10-CM

## 2020-08-12 DIAGNOSIS — Z9189 Other specified personal risk factors, not elsewhere classified: Secondary | ICD-10-CM | POA: Diagnosis not present

## 2020-08-12 DIAGNOSIS — Z0289 Encounter for other administrative examinations: Secondary | ICD-10-CM

## 2020-08-12 DIAGNOSIS — K760 Fatty (change of) liver, not elsewhere classified: Secondary | ICD-10-CM

## 2020-08-12 DIAGNOSIS — K219 Gastro-esophageal reflux disease without esophagitis: Secondary | ICD-10-CM | POA: Diagnosis not present

## 2020-08-13 LAB — VITAMIN B12: Vitamin B-12: 305 pg/mL (ref 232–1245)

## 2020-08-13 LAB — T4, FREE: Free T4: 1.06 ng/dL (ref 0.82–1.77)

## 2020-08-13 LAB — FOLATE: Folate: 12.2 ng/mL (ref >3.0)

## 2020-08-13 LAB — VITAMIN D 25 HYDROXY (VIT D DEFICIENCY, FRACTURES): Vit D, 25-Hydroxy: 19.4 ng/mL — ABNORMAL LOW (ref 30.0–100.0)

## 2020-08-13 LAB — TSH: TSH: 2.2 u[IU]/mL (ref 0.450–4.500)

## 2020-08-13 LAB — INSULIN, RANDOM: INSULIN: 9.9 u[IU]/mL (ref 2.6–24.9)

## 2020-08-19 NOTE — Progress Notes (Signed)
Dear Dr. Nani Ravens,   Thank you for referring Brittany Malone to our clinic. The following note includes my evaluation and treatment recommendations.  Chief Complaint:   OBESITY Brittany Malone (MR# [id number]) is a 55 y.o. female who presents for evaluation and treatment of obesity and related comorbidities. Current BMI is Body mass index is 39.38 kg/m. [name] has been struggling with her weight for many years and has been unsuccessful in either losing weight, maintaining weight loss, or reaching her healthy weight goal.  Graci is currently in the action stage of change and ready to dedicate time achieving and maintaining a healthier weight. [name] is interested in becoming our patient and working on intensive lifestyle modifications including (but not limited to) diet and exercise for weight loss.  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  She lives with her husband, [name].  She does not exercise, but she used to walk 3 miles per day along with weightlifting at the [facility] in the remote past.  She did Weight Watchers in the past and says it worked the best with regular exercise.  Craves nothing and does not snack.  Eats out 6 days a week.  Skips breakfast 1-2 times per week.  Her worst habit is eating late at night in front of the TV.  In 2018, she went from 273 to 223 pounds with Weight Watchers and regular exercise.  [name]'s habits were reviewed today and are as follows: Her family eats meals together, she struggles with family and or coworkers weight loss sabotage, her desired weight loss is 61 pounds, she has been heavy most of her life, she started gaining weight with pregnancy and childbirth, her heaviest weight ever was 275 pounds, she skips breakfast 1-2 times per week, she frequently makes poor food choices, she frequently eats larger portions than normal, and she struggles with emotional eating.  Depression Screen [name]'s Food and Mood (modified PHQ-9) score was 3.  Depression screen [facility] 2/9  08/12/2020  Decreased Interest 0  Down, Depressed, Hopeless 0  PHQ - 2 Score 0  Altered sleeping 0  Tired, decreased energy 2  Change in appetite 1  Feeling bad or failure about yourself  0  Trouble concentrating 0  Moving slowly or fidgety/restless 0  Suicidal thoughts 0  PHQ-9 Score 3    Assessment/Plan:   Orders Placed This Encounter  Procedures   T4, free   TSH   VITAMIN D 25 Hydroxy (Vit-D Deficiency, Fractures)   Vitamin B12   Folate   Insulin, random   EKG 12-Lead   Medications Discontinued During This Encounter  Medication Reason   metroNIDAZOLE (FLAGYL) 500 MG tablet Error   cetirizine (ZYRTEC) 10 MG tablet Error    1. Other fatigue [name] denies daytime somnolence and denies waking up still tired. [facility] generally gets 7 hours of sleep per night, and states that she has generally restful sleep. Snoring is not present. Apneic episodes are not present. Epworth Sleepiness Score is 2.  [name] does feel that her weight is causing her energy to be lower than it should be. Fatigue may be related to obesity, depression or many other causes. Labs will be ordered, and in the meanwhile, [name] will focus on self care including making healthy food choices, increasing physical activity and focusing on stress reduction.  Check EKG and labs today.  - EKG 12-Lead - T4, free - TSH - VITAMIN D 25 Hydroxy (Vit-D Deficiency, Fractures) - Insulin, random  2. Shortness of breath on exertion  [name] notes increasing shortness of breath with exercising and seems to be worsening over time with weight gain. She notes getting out of breath sooner with activity than she used to. This has gotten worse recently. [name] denies shortness of breath at rest or orthopnea.  [name] does feel that she gets out of breath more easily that she used to when she exercises. [name]'s shortness of breath appears to be obesity related and exercise induced. She has agreed to work on weight loss and gradually increase  exercise to treat her exercise induced shortness of breath. Will continue to monitor closely.  Check IC today.  3. Rheumatoid arthritis involving multiple sites, unspecified whether rheumatoid factor present ([name]) Isys has multiple joint pains.  She is followed by Dr. Estanislado Pandy in Rheumatology.  She is taking methotrexate 20 mg weekly, Elavil 50 mg at bedtime, hydroxychloroquine 200 mg twice daily,and folic acid 2 mg daily.  Plan:  Check labs.  Treatment plan per Rheumatology.  Start prudent nutritional plan and weight loss to help with joint pains.  4. Gastroesophageal reflux disease, unspecified whether esophagitis present [name] is taking Protonix 40 mg daily for GERD.  She is followed by Dr. [name] in GI.  She had an EGD that was positive for erosive esophagitis.  Plan:  Continue Protonix and follow-up with GI.  We reviewed the diagnosis of GERD and importance of treatment. We discussed "red flag" symptoms and the importance of follow up if symptoms persisted despite treatment. We reviewed non-pharmacologic management of GERD symptoms: including: caffeine reduction, dietary changes, elevate HOB, NPO after supper, reduction of alcohol intake, tobacco cessation, and weight loss.  5. NAFLD (nonalcoholic fatty liver disease) Diagnosed on abdominal ultrasound in 05/2020.  Recently, ALT was 57, AST 50.  Plan:  Check labs today.  NAFLD is an umbrella term that encompasses a disease spectrum that includes steatosis (fat) without inflammation, steatohepatitis (NASH; fat + inflammation in a characteristic pattern), and cirrhosis. Bland steatosis is felt to be a benign condition, with extremely low to no risk of progression to cirrhosis, whereas NASH can progress to cirrhosis. The mainstay of treatment of NAFLD includes lifestyle modification to achieve weight loss, at least 7% of current body weight. Low carbohydrate diets can be beneficial in improving NAFLD liver histology. Additionally, exercise,  even the absence of weight loss can have beneficial effects on the patient's metabolic profile and liver health.   6. Other iron deficiency anemia History of iron deficiency in the past prior to hysterectomy, then again once last year she was found to have low Hgb.  She is on ferrous sulfate 325 mg daily.  CBC on 06/08/2020 was 14.6/42.5.  Plan:   Labs are stable.  Continue supplement.  Check labs today.  Nutrition: Iron-rich foods include dark leafy greens, red and white meats, eggs, seafood, and beans.  Certain foods and drinks prevent your body from absorbing iron properly. Avoid eating these foods in the same meal as iron-rich foods or with iron supplements. These foods include: coffee, black tea, and red wine; milk, dairy products, and foods that are high in calcium; beans and soybeans; whole grains. Constipation can be a side effect of iron supplementation. Increased water and fiber intake are helpful. Water goal: > 2 liters/day. Fiber goal: > 25 grams/day.  CBC Latest Ref Rng & Units 04/16/2020 04/15/2020 01/14/2020  WBC 4.0 - 10.5 K/uL 6.0 5.8 4.8  Hemoglobin 12.0 - 15.0 g/dL 14.0 13.8 12.8  Hematocrit 36.0 - 46.0 % 40.1 40.0 37.7  Platelets  150.0 - 400.0 K/uL 249.0 231 217   Lab Results  Component Value Date   IRON 116 04/16/2020   FERRITIN 94.8 04/16/2020   Lab Results  Component Value Date   VITAMINB12 305 08/12/2020   - Vitamin B12 - Folate  7. Depression screening Sharika was screened for depression as part of her new patient workup today.  PHQ-9 is 3.  Depression screening is negative.  8. At risk for impaired metabolic function Due to Berlynn's current state of health and medical condition(s), she is at a significantly higher risk for impaired metabolic function.  At least 22 minutes was spent on counseling Zeena about these concerns today.  This places the patient at a much greater risk to subsequently develop cardio-pulmonary conditions that can negatively affect the  patient's quality of life.  I stressed the importance of reversing these risks factors.  The initial goal is to lose at least 5-10% of starting weight to help reduce risk factors.  Counseling:  Intensive lifestyle modifications discussed with Siera as the most appropriate first line treatment.  she will continue to work on diet, exercise, and weight loss efforts.  We will continue to reassess these conditions on a fairly regular basis in an attempt to decrease the patient's overall morbidity and mortality.  9. Class 2 severe obesity with serious comorbidity and body mass index (BMI) of 39.0 to 39.9 in adult, unspecified obesity type (HCC)  Carron is currently in the action stage of change and her goal is to continue with weight loss efforts. I recommend Anala begin the structured treatment plan as follows:  She has agreed to the Category 3 Plan.  Exercise goals:  As is.    Behavioral modification strategies: meal planning and cooking strategies and planning for success.  She was informed of the importance of frequent follow-up visits to maximize her success with intensive lifestyle modifications for her multiple health conditions. She was informed we would discuss her lab results at her next visit unless there is a critical issue that needs to be addressed sooner. Caleen agreed to keep her next visit at the agreed upon time to discuss these results.  Objective:   Blood pressure 121/81, pulse 68, temperature 97.7 F (36.5 C), height 5\' 8"  (1.727 m), weight 259 lb (117.5 kg), SpO2 99 %. Body mass index is 39.38 kg/m.  EKG: Normal sinus rhythm, rate 76 bpm.  Indirect Calorimeter completed today shows a VO2 of 292 and a REE of 2028.  Her calculated basal metabolic rate is 5784 thus her basal metabolic rate is better than expected.  General: Cooperative, alert, well developed, in no acute distress. HEENT: Conjunctivae and lids unremarkable. Cardiovascular: Regular rhythm.  Lungs: Normal work of  breathing. Neurologic: No focal deficits.   Lab Results  Component Value Date   CREATININE 0.68 06/16/2020   BUN 13 06/16/2020   NA 142 06/16/2020   K 4.2 06/16/2020   CL 107 06/16/2020   CO2 27 06/16/2020   Lab Results  Component Value Date   ALT 57 (H) 06/16/2020   AST 50 (H) 06/16/2020   ALKPHOS 53 06/16/2020   BILITOT 0.4 06/16/2020   Lab Results  Component Value Date   HGBA1C 5.4 06/16/2020   Lab Results  Component Value Date   INSULIN 9.9 08/12/2020   Lab Results  Component Value Date   TSH 2.200 08/12/2020   Lab Results  Component Value Date   CHOL 127 06/16/2020   HDL 60.00 06/16/2020   LDLCALC 51  06/16/2020   TRIG 80.0 06/16/2020   CHOLHDL 2 06/16/2020   Lab Results  Component Value Date   WBC 6.0 04/16/2020   HGB 14.0 04/16/2020   HCT 40.1 04/16/2020   MCV 89.8 04/16/2020   PLT 249.0 04/16/2020   Lab Results  Component Value Date   IRON 116 04/16/2020   FERRITIN 94.8 04/16/2020   Attestation Statements:   This is the patient's first visit at [facility]. The patient's NEW PATIENT PACKET was reviewed at length. Included in the packet: current and past health history, medications, allergies, ROS, gynecologic history (women only), surgical history, family history, social history, weight history, weight loss surgery history (for those that have had weight loss surgery), nutritional evaluation, mood and food questionnaire, PHQ9, Epworth questionnaire, sleep habits questionnaire, patient life and health improvement goals questionnaire. These will all be scanned into the patient's chart under media.   During the visit, I independently reviewed the patient's EKG, bioimpedance scale results, and indirect calorimeter results. I used this information to tailor a meal plan for the patient that will help her to lose weight and will improve her obesity-related conditions going forward. I performed a medically necessary appropriate examination and/or  evaluation. I discussed the assessment and treatment plan with the patient. The patient was provided an opportunity to ask questions and all were answered. The patient agreed with the plan and demonstrated an understanding of the instructions. Labs were ordered at this visit and will be reviewed at the next visit unless more critical results need to be addressed immediately. Clinical information was updated and documented in the EMR.   I, Water quality scientist, CMA, am acting as Location manager for [employer], DO.  I have reviewed the above documentation for accuracy and completeness, and I agree with the above. Marjory Sneddon, D.O.  The [place] was signed into law in 2016 which includes the topic of electronic health records.  This provides immediate access to information in MyChart.  This includes consultation notes, operative notes, office notes, lab results and pathology reports.  If you have any questions about what you read please let us know at your next visit so we can discuss your concerns and take corrective action if need be.  We are right here with you.

## 2020-08-20 ENCOUNTER — Ambulatory Visit: Payer: BC Managed Care – PPO | Admitting: Rheumatology

## 2020-08-22 ENCOUNTER — Other Ambulatory Visit: Payer: Self-pay | Admitting: *Deleted

## 2020-08-22 ENCOUNTER — Encounter: Payer: Self-pay | Admitting: Rheumatology

## 2020-08-22 DIAGNOSIS — Z79899 Other long term (current) drug therapy: Secondary | ICD-10-CM

## 2020-08-26 ENCOUNTER — Other Ambulatory Visit: Payer: Self-pay

## 2020-08-26 ENCOUNTER — Encounter (INDEPENDENT_AMBULATORY_CARE_PROVIDER_SITE_OTHER): Payer: Self-pay | Admitting: Family Medicine

## 2020-08-26 ENCOUNTER — Ambulatory Visit (INDEPENDENT_AMBULATORY_CARE_PROVIDER_SITE_OTHER): Payer: BC Managed Care – PPO | Admitting: Family Medicine

## 2020-08-26 VITALS — BP 129/82 | HR 74 | Temp 98.4°F | Ht 68.0 in | Wt 250.0 lb

## 2020-08-26 DIAGNOSIS — E8881 Metabolic syndrome: Secondary | ICD-10-CM | POA: Diagnosis not present

## 2020-08-26 DIAGNOSIS — Z9189 Other specified personal risk factors, not elsewhere classified: Secondary | ICD-10-CM | POA: Diagnosis not present

## 2020-08-26 DIAGNOSIS — D508 Other iron deficiency anemias: Secondary | ICD-10-CM | POA: Diagnosis not present

## 2020-08-26 DIAGNOSIS — M069 Rheumatoid arthritis, unspecified: Secondary | ICD-10-CM

## 2020-08-26 DIAGNOSIS — K760 Fatty (change of) liver, not elsewhere classified: Secondary | ICD-10-CM

## 2020-08-26 DIAGNOSIS — E88819 Insulin resistance, unspecified: Secondary | ICD-10-CM

## 2020-08-26 DIAGNOSIS — E538 Deficiency of other specified B group vitamins: Secondary | ICD-10-CM

## 2020-08-26 DIAGNOSIS — E559 Vitamin D deficiency, unspecified: Secondary | ICD-10-CM | POA: Diagnosis not present

## 2020-08-26 DIAGNOSIS — Z79899 Other long term (current) drug therapy: Secondary | ICD-10-CM | POA: Diagnosis not present

## 2020-08-26 DIAGNOSIS — K219 Gastro-esophageal reflux disease without esophagitis: Secondary | ICD-10-CM

## 2020-08-26 DIAGNOSIS — Z6839 Body mass index (BMI) 39.0-39.9, adult: Secondary | ICD-10-CM

## 2020-08-26 MED ORDER — CYANOCOBALAMIN 500 MCG PO TABS: 500.0000 ug | ORAL_TABLET | Freq: Every day | ORAL | Status: DC

## 2020-08-26 MED ORDER — VITAMIN D (ERGOCALCIFEROL) 1.25 MG (50000 UNIT) PO CAPS: 50000.0000 [IU] | ORAL_CAPSULE | ORAL | 0 refills | Status: DC

## 2020-08-27 LAB — COMPLETE METABOLIC PANEL WITH GFR
AG Ratio: 1.6 (calc) (ref 1.0–2.5)
ALT: 24 U/L (ref 6–29)
AST: 19 U/L (ref 10–35)
Albumin: 4.1 g/dL (ref 3.6–5.1)
Alkaline phosphatase (APISO): 59 U/L (ref 37–153)
BUN: 20 mg/dL (ref 7–25)
CO2: 24 mmol/L (ref 20–32)
Calcium: 9.2 mg/dL (ref 8.6–10.4)
Chloride: 108 mmol/L (ref 98–110)
Creat: 0.66 mg/dL (ref 0.50–1.05)
GFR, Est African American: 114 mL/min/{1.73_m2} (ref >=60)
GFR, Est Non African American: 98 mL/min/{1.73_m2} (ref >=60)
Globulin: 2.5 g/dL (calc) (ref 1.9–3.7)
Glucose, Bld: 88 mg/dL (ref 65–139)
Potassium: 4 mmol/L (ref 3.5–5.3)
Sodium: 139 mmol/L (ref 135–146)
Total Bilirubin: 0.4 mg/dL (ref 0.2–1.2)
Total Protein: 6.6 g/dL (ref 6.1–8.1)

## 2020-08-27 LAB — CBC WITH DIFFERENTIAL/PLATELET
Absolute Monocytes: 505 cells/uL (ref 200–950)
Basophils Absolute: 29 cells/uL (ref 0–200)
Basophils Relative: 0.5 %
Eosinophils Absolute: 93 cells/uL (ref 15–500)
Eosinophils Relative: 1.6 %
HCT: 39.7 % (ref 35.0–45.0)
Hemoglobin: 13.6 g/dL (ref 11.7–15.5)
Lymphs Abs: 1247 cells/uL (ref 850–3900)
MCH: 30.7 pg (ref 27.0–33.0)
MCHC: 34.3 g/dL (ref 32.0–36.0)
MCV: 89.6 fL (ref 80.0–100.0)
MPV: 10.2 fL (ref 7.5–12.5)
Monocytes Relative: 8.7 %
Neutro Abs: 3927 cells/uL (ref 1500–7800)
Neutrophils Relative %: 67.7 %
Platelets: 233 10*3/uL (ref 140–400)
RBC: 4.43 10*6/uL (ref 3.80–5.10)
RDW: 13 % (ref 11.0–15.0)
Total Lymphocyte: 21.5 %
WBC: 5.8 10*3/uL (ref 3.8–10.8)

## 2020-08-29 ENCOUNTER — Ambulatory Visit (INDEPENDENT_AMBULATORY_CARE_PROVIDER_SITE_OTHER): Payer: BC Managed Care – PPO | Admitting: Rheumatology

## 2020-08-29 ENCOUNTER — Encounter: Payer: Self-pay | Admitting: Rheumatology

## 2020-08-29 ENCOUNTER — Other Ambulatory Visit: Payer: Self-pay

## 2020-08-29 VITALS — BP 110/75 | HR 77 | Ht 68.0 in | Wt 255.2 lb

## 2020-08-29 DIAGNOSIS — Z8719 Personal history of other diseases of the digestive system: Secondary | ICD-10-CM

## 2020-08-29 DIAGNOSIS — M533 Sacrococcygeal disorders, not elsewhere classified: Secondary | ICD-10-CM

## 2020-08-29 DIAGNOSIS — Z8269 Family history of other diseases of the musculoskeletal system and connective tissue: Secondary | ICD-10-CM

## 2020-08-29 DIAGNOSIS — H2013 Chronic iridocyclitis, bilateral: Secondary | ICD-10-CM

## 2020-08-29 DIAGNOSIS — M545 Low back pain, unspecified: Secondary | ICD-10-CM

## 2020-08-29 DIAGNOSIS — Z85820 Personal history of malignant melanoma of skin: Secondary | ICD-10-CM

## 2020-08-29 DIAGNOSIS — Q667 Congenital pes cavus, unspecified foot: Secondary | ICD-10-CM

## 2020-08-29 DIAGNOSIS — Z79899 Other long term (current) drug therapy: Secondary | ICD-10-CM

## 2020-08-29 DIAGNOSIS — M4802 Spinal stenosis, cervical region: Secondary | ICD-10-CM

## 2020-08-29 DIAGNOSIS — M170 Bilateral primary osteoarthritis of knee: Secondary | ICD-10-CM | POA: Diagnosis not present

## 2020-08-29 DIAGNOSIS — M0579 Rheumatoid arthritis with rheumatoid factor of multiple sites without organ or systems involvement: Secondary | ICD-10-CM

## 2020-08-29 DIAGNOSIS — G8929 Other chronic pain: Secondary | ICD-10-CM

## 2020-08-29 MED ORDER — HYDROXYCHLOROQUINE SULFATE 200 MG PO TABS: 200.0000 mg | ORAL_TABLET | Freq: Every day | ORAL | 0 refills | Status: DC

## 2020-08-29 MED ORDER — METHOTREXATE SODIUM CHEMO INJECTION 50 MG/2ML: 25.0000 mg | INTRAMUSCULAR | 0 refills | Status: DC

## 2020-08-29 NOTE — Patient Instructions (Signed)
Standing Labs We placed an order today for your standing lab work.   Please have your standing labs drawn in 2 weeks and every 3 months  If possible, please have your labs drawn 2 weeks prior to your appointment so that the provider can discuss your results at your appointment.  Please note that you may see your imaging and lab results in [place] before we have reviewed them. We may be awaiting multiple results to interpret others before contacting you. Please allow our office up to 72 hours to thoroughly review all of the results before contacting the office for clarification of your results.  We have open lab daily: Monday through Thursday from 1:30-4:30 PM and Friday from 1:30-4:00 PM at the office of Dr. Bo Merino, [facility].   Please be advised, all patients with office appointments requiring lab work will take precedent over walk-in lab work.  If possible, please come for your lab work on Monday and Friday afternoons, as you may experience shorter wait times. The office is located at [address] [place], [place], [place] [address] No appointment is necessary.   Labs are drawn by [facility]. Please bring your co-pay at the time of your lab draw.  You may receive a bill from [facility] for your lab work.  If you wish to have your labs drawn at another location, please call the office 24 hours in advance to send orders.  If you have any questions regarding directions or hours of operation,  please call [phone].   As a reminder, please drink plenty of water prior to coming for your lab work. Thanks!   Vaccines You are taking a medication(s) that can suppress your immune system.  The following immunizations are recommended: Flu annually Covid-19  Td/Tdap (tetanus, diphtheria, pertussis) every 10 years Pneumonia (Prevnar 15 then Pneumovax 23 at least 1 year apart.  Alternatively, can take Prevnar 20 without needing additional dose) Shingrix (after age 40): 2  doses from 4 weeks to 6 months apart  Please check with your PCP to make sure you are up to date.   If you test POSITIVE for COVID19 and have MILD to MODERATE symptoms: First, call your PCP if you would like to receive COVID19 treatment AND Hold your medications during the infection and for at least 1 week after your symptoms have resolved: Injectable medication (Benlysta, Cimzia, Cosentyx, Enbrel, Humira, Orencia, Remicade, Simponi, Stelara, Taltz, Tremfya) Methotrexate Leflunomide (Arava) Mycophenolate (Cellcept) Morrie Sheldon, Olumiant, or Rinvoq If you take Actemra or Kevzara, you DO NOT need to hold these for COVID19 infection.  If you test POSITIVE for COVID19 and have NO symptoms: First, call your PCP if you would like to receive COVID19 treatment AND Hold your medications for at least 10 days after the day that you tested positive Injectable medication (Benlysta, Cimzia, Cosentyx, Enbrel, Humira, Orencia, Remicade, Simponi, Stelara, Taltz, Tremfya) Methotrexate Leflunomide (Arava) Mycophenolate (Cellcept) Morrie Sheldon, Olumiant, or Rinvoq If you take Actemra or Kevzara, you DO NOT need to hold these for COVID19 infection.  If you have signs or symptoms of an infection or start antibiotics: First, call your PCP for workup of your infection. Hold your medication through the infection, until you complete your antibiotics, and until symptoms resolve if you take the following: Injectable medication (Actemra, Benlysta, Cimzia, Cosentyx, Enbrel, Humira, Kevzara, Orencia, Remicade, Simponi, Stelara, Taltz, Tremfya) Methotrexate Leflunomide (Arava) Mycophenolate (Cellcept) Roma Kayser, or Rinvoq  Heart Disease Prevention   Your inflammatory disease increases your risk of heart disease which  includes heart attack, stroke, atrial fibrillation (irregular heartbeats), high blood pressure, heart failure and atherosclerosis (plaque in the arteries).  It is important to reduce your risk by:    Keep blood pressure, cholesterol, and blood sugar at healthy levels   Smoking Cessation   Maintain a healthy weight  BMI 20-25   Eat a healthy diet  Plenty of fresh fruit, vegetables, and whole grains  Limit saturated fats, foods high in sodium, and added sugars  DASH and Mediterranean diet   Increase physical activity  Recommend moderate physically activity for 150 minutes per week/ 30 minutes a day for five days a week These can be broken up into three separate ten-minute sessions during the day.   Reduce Stress  Meditation, slow breathing exercises, yoga, coloring books  Dental visits twice a year   Cervical Strain and Sprain Rehab Ask your health care provider which exercises are safe for you. Do exercises exactly as told by your health care provider and adjust them as directed. It is normal to feel mild stretching, pulling, tightness, or discomfort as you do these exercises. Stop right away if you feel sudden pain or your pain gets worse. Do not begin these exercises until told by your health care provider. Stretching and range-of-motion exercises Cervical side bending  Using good posture, sit on a stable chair or stand up. Without moving your shoulders, slowly tilt your left / right ear to your shoulder until you feel a stretch in the opposite side neck muscles. You should be looking straight ahead. Hold for __________ seconds. Repeat with the other side of your neck. Repeat __________ times. Complete this exercise __________ times a day. Cervical rotation  Using good posture, sit on a stable chair or stand up. Slowly turn your head to the side as if you are looking over your left / right shoulder. Keep your eyes level with the ground. Stop when you feel a stretch along the side and the back of your neck. Hold for __________ seconds. Repeat this by turning to your other side. Repeat __________ times. Complete this exercise __________ times a day. Thoracic extension and  pectoral stretch Roll a towel or a small blanket so it is about 4 inches (10 cm) in diameter. Lie down on your back on a firm surface. Put the towel lengthwise, under your spine in the middle of your back. It should not be under your shoulder blades. The towel should line up with your spine from your middle back to your lower back. Put your hands behind your head and let your elbows fall out to your sides. Hold for __________ seconds. Repeat __________ times. Complete this exercise __________ times a day. Strengthening exercises Isometric upper cervical flexion Lie on your back with a thin pillow behind your head and a small rolled-up towel under your neck. Gently tuck your chin toward your chest and nod your head down to look toward your feet. Do not lift your head off the pillow. Hold for __________ seconds. Release the tension slowly. Relax your neck muscles completely before you repeat this exercise. Repeat __________ times. Complete this exercise __________ times a day. Isometric cervical extension  Stand about 6 inches (15 cm) away from a wall, with your back facing the wall. Place a soft object, about 6-8 inches (15-20 cm) in diameter, between the back of your head and the wall. A soft object could be a small pillow, a ball, or a folded towel. Gently tilt your head back and press into  the soft object. Keep your jaw and forehead relaxed. Hold for __________ seconds. Release the tension slowly. Relax your neck muscles completely before you repeat this exercise. Repeat __________ times. Complete this exercise __________ times a day. Posture and body mechanics Body mechanics refers to the movements and positions of your body while you do your daily activities. Posture is part of body mechanics. Good posture and healthy body mechanics can help to relieve stress in your body's tissues and joints. Good posture means that your spine is in its natural S-curve position (your spine is neutral),  your shoulders are pulled back slightly, and your head is not tipped forward. The following are general guidelines for applying improved posture andbody mechanics to your everyday activities. Sitting  When sitting, keep your spine neutral and keep your feet flat on the floor. Use a footrest, if necessary, and keep your thighs parallel to the floor. Avoid rounding your shoulders, and avoid tilting your head forward. When working at a desk or a computer, keep your desk at a height where your hands are slightly lower than your elbows. Slide your chair under your desk so you are close enough to maintain good posture. When working at a computer, place your monitor at a height where you are looking straight ahead and you do not have to tilt your head forward or downward to look at the screen.  Standing  When standing, keep your spine neutral and keep your feet about hip-width apart. Keep a slight bend in your knees. Your ears, shoulders, and hips should line up. When you do a task in which you stand in one place for a long time, place one foot up on a stable object that is 2-4 inches (5-10 cm) high, such as a footstool. This helps keep your spine neutral.  Resting When lying down and resting, avoid positions that are most painful for you. Try to support your neck in a neutral position. You can use a contour pillow or asmall rolled-up towel. Your pillow should support your neck but not push on it. This information is not intended to replace advice given to you by your health care provider. Make sure you discuss any questions you have with your healthcare provider. Document Revised: 06/07/2018 Document Reviewed: 11/16/2017 Elsevier Patient Education  2022 [place] for [employer]itioners, 15(4), [phone]. Retrieved December 05, 2017 from http://clinicalkey.com/nursing">  Knee Exercises Ask your health care provider which exercises are safe for you. Do exercises exactly as told by your health  care provider and adjust them as directed. It is normal to feel mild stretching, pulling, tightness, or discomfort as you do these exercises. Stop right away if you feel sudden pain or your pain gets worse. Do not begin these exercises until told by your health care provider. Stretching and range-of-motion exercises These exercises warm up your muscles and joints and improve the movement and flexibility of your knee. These exercises also help to relieve pain andswelling. Knee extension, prone Lie on your abdomen (prone position) on a bed. Place your left / right knee just beyond the edge of the surface so your knee is not on the bed. You can put a towel under your left / right thigh just above your kneecap for comfort. Relax your leg muscles and allow gravity to straighten your knee (extension). You should feel a stretch behind your left / right knee. Hold this position for __________ seconds. Scoot up so your knee is supported between repetitions. Repeat __________ times. Complete this  exercise __________ times a day. Knee flexion, active  Lie on your back with both legs straight. If this causes back discomfort, bend your left / right knee so your foot is flat on the floor. Slowly slide your left / right heel back toward your buttocks. Stop when you feel a gentle stretch in the front of your knee or thigh (flexion). Hold this position for __________ seconds. Slowly slide your left / right heel back to the starting position. Repeat __________ times. Complete this exercise __________ times a day. Quadriceps stretch, prone  Lie on your abdomen on a firm surface, such as a bed or padded floor. Bend your left / right knee and hold your ankle. If you cannot reach your ankle or pant leg, loop a belt around your foot and grab the belt instead. Gently pull your heel toward your buttocks. Your knee should not slide out to the side. You should feel a stretch in the front of your thigh and knee  (quadriceps). Hold this position for __________ seconds. Repeat __________ times. Complete this exercise __________ times a day. Hamstring, supine Lie on your back (supine position). Loop a belt or towel over the ball of your left / right foot. The ball of your foot is on the walking surface, right under your toes. Straighten your left / right knee and slowly pull on the belt to raise your leg until you feel a gentle stretch behind your knee (hamstring). Do not let your knee bend while you do this. Keep your other leg flat on the floor. Hold this position for __________ seconds. Repeat __________ times. Complete this exercise __________ times a day. Strengthening exercises These exercises build strength and endurance in your knee. Endurance is theability to use your muscles for a long time, even after they get tired. Quadriceps, isometric This exercise stretches the muscles in front of your thigh (quadriceps) without moving your knee joint (isometric). Lie on your back with your left / right leg extended and your other knee bent. Put a rolled towel or small pillow under your knee if told by your health care provider. Slowly tense the muscles in the front of your left / right thigh. You should see your kneecap slide up toward your hip or see increased dimpling just above the knee. This motion will push the back of the knee toward the floor. For __________ seconds, hold the muscle as tight as you can without increasing your pain. Relax the muscles slowly and completely. Repeat __________ times. Complete this exercise __________ times a day. Straight leg raises This exercise stretches the muscles in front of your thigh (quadriceps) and the muscles that move your hips (hip flexors). Lie on your back with your left / right leg extended and your other knee bent. Tense the muscles in the front of your left / right thigh. You should see your kneecap slide up or see increased dimpling just above the  knee. Your thigh may even shake a bit. Keep these muscles tight as you raise your leg 4-6 inches (10-15 cm) off the floor. Do not let your knee bend. Hold this position for __________ seconds. Keep these muscles tense as you lower your leg. Relax your muscles slowly and completely after each repetition. Repeat __________ times. Complete this exercise __________ times a day. Hamstring, isometric Lie on your back on a firm surface. Bend your left / right knee about __________ degrees. Dig your left / right heel into the surface as if you are trying to  pull it toward your buttocks. Tighten the muscles in the back of your thighs (hamstring) to "dig" as hard as you can without increasing any pain. Hold this position for __________ seconds. Release the tension gradually and allow your muscles to relax completely for __________ seconds after each repetition. Repeat __________ times. Complete this exercise __________ times a day. Hamstring curls If told by your health care provider, do this exercise while wearing ankle weights. Begin with __________ lb weights. Then increase the weight by 1 lb (0.5 kg) increments. Do not wear ankle weights that are more than __________ lb. Lie on your abdomen with your legs straight. Bend your left / right knee as far as you can without feeling pain. Keep your hips flat against the floor. Hold this position for __________ seconds. Slowly lower your leg to the starting position. Repeat __________ times. Complete this exercise __________ times a day. Squats This exercise strengthens the muscles in front of your thigh and knee (quadriceps). Stand in front of a table, with your feet and knees pointing straight ahead. You may rest your hands on the table for balance but not for support. Slowly bend your knees and lower your hips like you are going to sit in a chair. Keep your weight over your heels, not over your toes. Keep your lower legs upright so they are parallel  with the table legs. Do not let your hips go lower than your knees. Do not bend lower than told by your health care provider. If your knee pain increases, do not bend as low. Hold the squat position for __________ seconds. Slowly push with your legs to return to standing. Do not use your hands to pull yourself to standing. Repeat __________ times. Complete this exercise __________ times a day. Wall slides This exercise strengthens the muscles in front of your thigh and knee (quadriceps). Lean your back against a smooth wall or door, and walk your feet out 18-24 inches (46-61 cm) from it. Place your feet hip-width apart. Slowly slide down the wall or door until your knees bend __________ degrees. Keep your knees over your heels, not over your toes. Keep your knees in line with your hips. Hold this position for __________ seconds. Repeat __________ times. Complete this exercise __________ times a day. Straight leg raises This exercise strengthens the muscles that rotate the leg at the hip and move it away from your body (hip abductors). Lie on your side with your left / right leg in the top position. Lie so your head, shoulder, knee, and hip line up. You may bend your bottom knee to help you keep your balance. Roll your hips slightly forward so your hips are stacked directly over each other and your left / right knee is facing forward. Leading with your heel, lift your top leg 4-6 inches (10-15 cm). You should feel the muscles in your outer hip lifting. Do not let your foot drift forward. Do not let your knee roll toward the ceiling. Hold this position for __________ seconds. Slowly return your leg to the starting position. Let your muscles relax completely after each repetition. Repeat __________ times. Complete this exercise __________ times a day. Straight leg raises This exercise stretches the muscles that move your hips away from the front of the pelvis (hip extensors). Lie on your  abdomen on a firm surface. You can put a pillow under your hips if that is more comfortable. Tense the muscles in your buttocks and lift your left / right leg  about 4-6 inches (10-15 cm). Keep your knee straight as you lift your leg. Hold this position for __________ seconds. Slowly lower your leg to the starting position. Let your leg relax completely after each repetition. Repeat __________ times. Complete this exercise __________ times a day. This information is not intended to replace advice given to you by your health care provider. Make sure you discuss any questions you have with your healthcare provider. Document Revised: 12/06/2017 Document Reviewed: 12/06/2017 Elsevier Patient Education  2022 [employer].

## 2020-08-29 NOTE — Progress Notes (Signed)
Office Visit Note  Patient: Brittany Malone             Date of Birth: Aug 20, 1965           MRN: [id number]             PCP: Shelda Pal, DO Referring: Shelda Pal* Visit Date: 08/29/2020 Occupation: @GUAROCC @  Subjective:  Follow-up (Patient complains of bilateral knee pain- recently had gel injections at [facility] in [facility], offered some relief. Patient had left knee MRI recently. )   History of Present Illness: Brittany Malone is a 55 y.o. female with a history of rheumatoid arthritis and osteoarthritis.  She states in February she went on vacation and after that she started having pain and discomfort in her bilateral knee joints.  She was seen by orthopedics at [facility] where she had x-rays of her bilateral knee joints.  The x-ray showed mild osteoarthritis.  As she had persistent pain and discomfort she had MRI of her left knee joint which showed mild to moderate patellofemoral arthritis and and medial compartment narrowing.  There was a small knee joint effusion.  No meniscal tear was noted.  She had a cortisone injection which did not help much.  She also had a Visco supplement injection to her left knee joint it helped to some extent.  None of the other joints are painful or swollen.  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  Activities of Daily Living:  Patient reports morning stiffness for 15-20 minutes.   Patient Denies nocturnal pain.  Difficulty dressing/grooming: Denies Difficulty climbing stairs: Denies Difficulty getting out of chair: Denies Difficulty using hands for taps, buttons, cutlery, and/or writing: Denies  Review of Systems  Constitutional:  Positive for fatigue.  HENT:  Positive for mouth dryness. Negative for mouth sores and nose dryness.   Eyes:  Negative for pain, itching,  visual disturbance and dryness.  Respiratory:  Negative for cough, hemoptysis, shortness of breath and difficulty breathing.   Cardiovascular:  Negative for chest pain, palpitations and swelling in legs/feet.  Gastrointestinal:  Negative for abdominal pain, blood in stool, constipation and diarrhea.  Endocrine: Negative for increased urination.  Genitourinary:  Negative for painful urination.  Musculoskeletal:  Positive for joint pain, joint pain, myalgias, morning stiffness and myalgias. Negative for joint swelling, muscle weakness and muscle tenderness.  Skin:  Negative for color change, rash and redness.  Allergic/Immunologic: Negative for susceptible to infections.  Neurological:  Negative for dizziness, numbness, headaches, memory loss and weakness.  Hematological:  Negative for swollen glands.  Psychiatric/Behavioral:  Negative for confusion and sleep disturbance.    PMFS History:  Patient Active Problem List   Diagnosis Date Noted   Rheumatoid arthritis ([place])    NAFLD (nonalcoholic fatty liver disease)    GERD (gastroesophageal reflux disease)    Rheumatoid arthritis involving multiple sites with positive rheumatoid factor ([place]) 08/15/2018   Family history of rheumatoid arthritis 08/15/2018   Chronic uveitis of both eyes 08/15/2018   Pes cavus 08/15/2018   History of gastroesophageal reflux (GERD) 08/15/2018    Past Medical History:  Diagnosis Date   Allergy    SEASONAL   Anemia    Arthritis    Back pain    Diverticulitis    Fatty liver    per patient   GERD (gastroesophageal reflux disease)  Insomnia    Joint pain    NAFLD (nonalcoholic fatty liver disease)    Rheumatoid arthritis ([place])     Family History  Problem Relation Age of Onset   Lupus Mother    Fibromyalgia Mother    Hyperlipidemia Mother    Hypertension Father    High Cholesterol Father    Hyperlipidemia Father    Colon polyps Father    Kidney Stones Brother    Healthy Son    Hypertension  Daughter    Migraines Daughter    Colon cancer Neg Hx    Esophageal cancer Neg Hx    Rectal cancer Neg Hx    Stomach cancer Neg Hx    Past Surgical History:  Procedure Laterality Date   ABDOMINAL HYSTERECTOMY     ANKLE SURGERY     COLONOSCOPY  01/18/2020   UPPER GI ENDOSCOPY     Social History   Social History Narrative   Not on file   Immunization History  Administered Date(s) Administered   Influenza,inj,Quad PF,6+ Mos 11/16/2018   Moderna Sars-Covid-2 Vaccination 03/08/2019, 04/05/2019, 11/23/2019, 06/02/2020   Pneumococcal Polysaccharide-23 11/16/2018   Tdap 05/16/2010, 06/16/2020   Zoster Recombinat (Shingrix) 11/16/2018, 01/16/2019     Objective: Vital Signs: BP 110/75 (BP Location: Left Arm, Patient Position: Sitting, Cuff Size: Normal)   Pulse 77   Ht 5\' 8"  (1.727 m)   Wt 255 lb 3.2 oz (115.8 kg)   BMI 38.80 kg/m    Physical Exam Vitals and nursing note reviewed.  Constitutional:      Appearance: She is well-developed.  HENT:     Head: Normocephalic and atraumatic.  Eyes:     Conjunctiva/sclera: Conjunctivae normal.  Cardiovascular:     Rate and Rhythm: Normal rate and regular rhythm.     Heart sounds: Normal heart sounds.  Pulmonary:     Effort: Pulmonary effort is normal.     Breath sounds: Normal breath sounds.  Abdominal:     General: Bowel sounds are normal.     Palpations: Abdomen is soft.  Musculoskeletal:     Cervical back: Normal range of motion.  Lymphadenopathy:     Cervical: No cervical adenopathy.  Skin:    General: Skin is warm and dry.     Capillary Refill: Capillary refill takes less than 2 seconds.  Neurological:     Mental Status: She is alert and oriented to person, place, and time.  Psychiatric:        Behavior: Behavior normal.     Musculoskeletal Exam: C-spine was in good range of motion.  Shoulder joints, elbow joints, wrist joints, MCPs PIPs and DIPs were in good range with no synovitis.  Hip joints were in good range  of motion with no discomfort.  She had warmth on palpation of the left knee joint without any effusion or swelling.  Right knee joint was in good range of motion.  She had no tenderness over ankles or MTPs.  CDAI Exam: CDAI Score: 1.4  Patient Global: 2 mm; Provider Global: 2 mm Swollen: 0 ; Tender: 1  Joint Exam 08/29/2020      Right  Left  Knee      Tender     Investigation: No additional findings.  Imaging: No results found.  Recent Labs: Lab Results  Component Value Date   WBC 5.8 08/26/2020   HGB 13.6 08/26/2020   PLT 233 08/26/2020   NA 139 08/26/2020   K 4.0 08/26/2020   CL 108 08/26/2020  CO2 24 08/26/2020   GLUCOSE 88 08/26/2020   BUN 20 08/26/2020   CREATININE 0.66 08/26/2020   BILITOT 0.4 08/26/2020   ALKPHOS 53 06/16/2020   AST 19 08/26/2020   ALT 24 08/26/2020   PROT 6.6 08/26/2020   ALBUMIN 3.8 06/16/2020   CALCIUM 9.2 08/26/2020   GFRAA 114 08/26/2020   QFTBGOLDPLUS NEGATIVE 11/20/2019    Speciality Comments: PLQ Eye Exam: 06/02/2020 WNL with Pre-existing Toxo Scars @ [facility] Follow up  in 1 year.  Procedures:  No procedures performed Allergies: Penicillins   Assessment / Plan:     Visit Diagnoses: Rheumatoid arthritis involving multiple sites with positive rheumatoid factor (HCC) - Polyarthralgia, positive RF, positive anti-CCP -she has been doing quite well on current combination.  She recently developed increased pain and discomfort in her bilateral knee joints.  She had x-rays at the orthopedic surgeons office which showed bilateral mild osteoarthritis and mild chondromalacia patella.  As her left knee joint pain persists she had MRI which showed moderate patellofemoral narrowing and medial compartment narrowing.  She also was found to have a small effusion.  She had cortisone injection followed by Visco supplement injection with some relief.  Today on examination she has some warmth on palpation of the left knee joint.  None of  the other joints were swollen.  She was also evaluated by Dr. Celesta Aver, per ophthalmologist.  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  He recommended decreasing hydroxychloroquine dose to 1 tablet daily.  I detailed discussion with the patient.  She was in agreement.  We will decrease hydroxychloroquine to 1 tablet daily and increase methotrexate to 1 mL subcu weekly.  She will come in for lab work in 2 weeks and then every 3 months to monitor for drug toxicity.  Plan: hydroxychloroquine (PLAQUENIL) 200 MG tablet  High risk medication use - Methotrexate 0.8 ml sq injections once weekly, folic acid 2 mg po daily, and plaquenil 200 mg 1 tablet by mouth twice daily. PLQ Eye Exam: 06/02/20 WNL.  The dose of Plaquenil was decreased to 200 mg p.o. daily.  And methotrexate was increased to 1 mL subcu weekly.  Patient was advised to stop both medications in case she develops an infection and resume the medications once the infection resolves.  She has been fully vaccinated against COVID-19 and also received a booster in April.  Instructions regarding other immunization recommendations were placed in the AVS.  Chronic uveitis of both eyes-she has not had any recurrence of uveitis.  Primary osteoarthritis of both knees - Recent x-rays at [facility] showed bilateral mild osteoarthritis and mild chondromalacia patella.  I gave her a handout on lower extremity muscle strengthening exercises.  Chronic SI joint pain-she continues to have off-and-on discomfort in the SI joints but is currently not active.  Pes cavus-she had recent episode of plantar fasciitis which resolved.  Cervical stenosis of spinal canal - February 2021-Worker compensation case.  She continues to have cervical spine discomfort.  Some of the exercises were demonstrated in the office and a handout on neck exercises was given.  Chronic midline low back pain without  sciatica-she continues to have some lower back pain off and on.  Family history of systemic lupus erythematosus (SLE) in mother  History of melanoma  History of diverticulosis  History of gastroesophageal reflux (GERD)  Increased risk of heart disease with rheumatoid arthritis was discussed.  Dietary modifications and exercise were  emphasized.  Patient states that she was referred to the weight management clinic by her PCP.  She is working on dietary modifications.  Orders: No orders of the defined types were placed in this encounter.  Meds ordered this encounter  Medications   hydroxychloroquine (PLAQUENIL) 200 MG tablet    Sig: Take 1 tablet (200 mg total) by mouth daily.    Dispense:  90 tablet    Refill:  0   methotrexate 50 MG/2ML injection    Sig: Inject 1 mL (25 mg total) into the skin once a week.    Dispense:  12 mL    Refill:  0      Follow-Up Instructions: Return in about 3 months (around 11/29/2020) for Rheumatoid arthritis, Osteoarthritis.   Bo Merino, MD  Note - This record has been created using Editor, commissioning.  Chart creation errors have been sought, but may not always  have been located. Such creation errors do not reflect on  the standard of medical care.

## 2020-09-04 NOTE — Progress Notes (Signed)
Chief Complaint:   OBESITY Brittany Malone is here to discuss her progress with her obesity treatment plan along with follow-up of her obesity related diagnoses.   Today's visit was #: 2 Starting weight: 259 lbs Starting date: 08/12/2020 Today's weight: 250 lbs Today's date: 08/26/2020 Weight change since last visit: 9 lbs Total lbs lost to date: 9 lbs Body mass index is 38.01 kg/m.  Total weight loss percentage to date: -3.47%  Interim History:  Brittany Malone is here today for her first follow-up office visit since starting the program with [place].  All blood work/ lab tests that were recently ordered by myself or an outside provider were reviewed with patient today per their request.   Extended time was spent counseling her on all new disease processes that were discovered or preexisting ones that are worsening.  she understands that many of these abnormalities will need to monitored regularly along with the current treatment plan of prudent dietary changes, in which we are making each and every office visit, to improve these health parameters.  Brittany Malone traveled and packed coolers and did really well staying on plan.  She says it was a lot of protein to eat.  Current Meal Plan: the Category 3 Plan for 88% of the time.  Current Exercise Plan: Walking for 30 minutes 7 times per week.  Assessment/Plan:   Meds ordered this encounter  Medications   Vitamin D, Ergocalciferol, (DRISDOL) 1.25 MG (50000 UNIT) CAPS capsule    Sig: Take 1 capsule (50,000 Units total) by mouth every 7 (seven) days.    Dispense:  4 capsule    Refill:  0    1 mo script; ov for RF   vitamin B-12 (CYANOCOBALAMIN) 500 MCG tablet    Sig: Take 1 tablet (500 mcg total) by mouth daily.   1. NAFLD (nonalcoholic fatty liver disease) No elevated ALT or AST.  CMP done 06/08/2020 at outside clinic.  Plan:  Discussed labs with patient today.  Stable.  Within normal limits.  NAFLD is an umbrella term that encompasses a disease  spectrum that includes steatosis (fat) without inflammation, steatohepatitis (NASH; fat + inflammation in a characteristic pattern), and cirrhosis. Bland steatosis is felt to be a benign condition, with extremely low to no risk of progression to cirrhosis, whereas NASH can progress to cirrhosis. The mainstay of treatment of NAFLD includes lifestyle modification to achieve weight loss, at least 7% of current body weight. Low carbohydrate diets can be beneficial in improving NAFLD liver histology. Additionally, exercise, even the absence of weight loss can have beneficial effects on the patient's metabolic profile and liver health.   2. Other iron deficiency anemia Denies shortness of breath/symptoms of concern.  CBC on 06/08/2020.  No RLS symptoms or constipation from iron supplement.  Taking ferrous sulfate 325 mg daily.  Plan:  Discussed labs with patient today.  Stable.  Asymptomatic.  Continue supplement.  Nutrition: Iron-rich foods include dark leafy greens, red and white meats, eggs, seafood, and beans.  Certain foods and drinks prevent your body from absorbing iron properly. Avoid eating these foods in the same meal as iron-rich foods or with iron supplements. These foods include: coffee, black tea, and red wine; milk, dairy products, and foods that are high in calcium; beans and soybeans; whole grains. Constipation can be a side effect of iron supplementation. Increased water and fiber intake are helpful. Water goal: > 2 liters/day. Fiber goal: > 25 grams/day.  CBC Latest Ref Rng & Units 08/26/2020  04/16/2020 04/15/2020  WBC 3.8 - 10.8 Thousand/uL 5.8 6.0 5.8  Hemoglobin 11.7 - 15.5 g/dL 13.6 14.0 13.8  Hematocrit 35.0 - 45.0 % 39.7 40.1 40.0  Platelets 140 - 400 Thousand/uL 233 249.0 231   Lab Results  Component Value Date   IRON 116 04/16/2020   FERRITIN 94.8 04/16/2020   Lab Results  Component Value Date   VITAMINB12 305 08/12/2020   3. Insulin resistance Improving, but not optimized.  Goal is HgbA1c < 5.7, fasting insulin closer to 5.  Medication: None.  Normal A1c with PCP in April.  Plan:  New.  Discussed labs with patient today.  She will continue to focus on protein-rich, low simple carbohydrate foods. We reviewed the importance of hydration, regular exercise for stress reduction, and restorative sleep.   Lab Results  Component Value Date   HGBA1C 5.4 06/16/2020   Lab Results  Component Value Date   INSULIN 9.9 08/12/2020   4. Rheumatoid arthritis, involving unspecified site, unspecified whether rheumatoid factor present ([place]) Treatment per Dr. Estanislado Pandy of Rheumatology.  Taking Plaquenil and methotrexate.   Plan:  no worsening or change in symptoms.  Continue follow-up with Rheumatology for medication management.  Continue weight loss.  Continue walking.  5. Gastroesophageal reflux disease, unspecified whether esophagitis present No worsening symptoms with meal plan.  No issues of concern.  Taking Protonix 40 mg daily.  Plan:  Discussed labs with patient today.  We reviewed the diagnosis of GERD and importance of treatment. We discussed "red flag" symptoms and the importance of follow up if symptoms persisted despite treatment. We reviewed non-pharmacologic management of GERD symptoms: including: caffeine reduction, dietary changes, elevate HOB, NPO after supper, reduction of alcohol intake, tobacco cessation, and weight loss.  6. Vitamin D deficiency Not at goal.  He has never been on a vitamin D supplement.  Plan:  New.  Discussed labs with patient today.  Start to take prescription Vitamin D @50 ,000 IU every week as prescribed.  Follow-up for routine testing of Vitamin D, at least 2-3 times per year to avoid over-replacement.  Lab Results  Component Value Date   VD25OH 19.4 (L) 08/12/2020   - Start Vitamin D, Ergocalciferol, (DRISDOL) 1.25 MG (50000 UNIT) CAPS capsule; Take 1 capsule (50,000 Units total) by mouth every 7 (seven) days.  Dispense: 4 capsule;  Refill: 0  7. B12 deficiency Lab Results  Component Value Date   VITAMINB12 305 08/12/2020   Supplementation: .None.  He endorses fatigue.  Plan:  New.  Discussed labs with patient today.  Start OTC vitamin B12 500 mcg daily.  - Start vitamin B-12 (CYANOCOBALAMIN) 500 MCG tablet; Take 1 tablet (500 mcg total) by mouth daily.  8. At risk for diabetes mellitus - Brittany Malone was given diabetes prevention education and counseling today of more than 22 minutes.  - Counseled patient on pathophysiology of disease and meaning/ implication of lab results.  - Reviewed how certain foods can either stimulate or inhibit insulin release, and subsequently affect hunger pathways  - Importance of following a healthy meal plan with limiting amounts of simple carbohydrates discussed with patient - Effects of regular aerobic exercise on blood sugar regulation reviewed and encouraged an eventual goal of 30 min 5d/week or more as a minimum.  - Briefly discussed treatment options, which always include dietary and lifestyle modification as first line.   - Handouts provided at patient's desire and/or told to go online to the American Diabetes Association website for further information.  9. Obesity with  current BMI of 38.01  Course: Brittany Malone is currently in the action stage of change. As such, her goal is to continue with weight loss efforts.   Nutrition goals: She has agreed to the Category 3 Plan.   Exercise goals:  As is.  Behavioral modification strategies: increasing lean protein intake, decreasing simple carbohydrates, meal planning and cooking strategies, and planning for success.  [place] has agreed to follow-up with our clinic in 2 weeks. She was informed of the importance of frequent follow-up visits to maximize her success with intensive lifestyle modifications for her multiple health conditions.   Objective:   Blood pressure 129/82, pulse 74, temperature 98.4 F (36.9 C), height 5\' 8"  (1.727 m), weight  250 lb (113.4 kg), SpO2 96 %. Body mass index is 38.01 kg/m.  General: Cooperative, alert, well developed, in no acute distress. HEENT: Conjunctivae and lids unremarkable. Cardiovascular: Regular rhythm.  Lungs: Normal work of breathing. Neurologic: No focal deficits.   Lab Results  Component Value Date   CREATININE 0.66 08/26/2020   BUN 20 08/26/2020   NA 139 08/26/2020   K 4.0 08/26/2020   CL 108 08/26/2020   CO2 24 08/26/2020   Lab Results  Component Value Date   ALT 24 08/26/2020   AST 19 08/26/2020   ALKPHOS 53 06/16/2020   BILITOT 0.4 08/26/2020   Lab Results  Component Value Date   HGBA1C 5.4 06/16/2020   Lab Results  Component Value Date   INSULIN 9.9 08/12/2020   Lab Results  Component Value Date   TSH 2.200 08/12/2020   Lab Results  Component Value Date   CHOL 127 06/16/2020   HDL 60.00 06/16/2020   LDLCALC 51 06/16/2020   TRIG 80.0 06/16/2020   CHOLHDL 2 06/16/2020   Lab Results  Component Value Date   VD25OH 19.4 (L) 08/12/2020   Lab Results  Component Value Date   WBC 5.8 08/26/2020   HGB 13.6 08/26/2020   HCT 39.7 08/26/2020   MCV 89.6 08/26/2020   PLT 233 08/26/2020   Lab Results  Component Value Date   IRON 116 04/16/2020   FERRITIN 94.8 04/16/2020   Attestation Statements:   Reviewed by clinician on day of visit: allergies, medications, problem list, medical history, surgical history, family history, social history, and previous encounter notes.  I, Water quality scientist, CMA, am acting as Location manager for [employer], DO.  I have reviewed the above documentation for accuracy and completeness, and I agree with the above. Marjory Sneddon, D.O.  The [place] was signed into law in 2016 which includes the topic of electronic health records.  This provides immediate access to information in MyChart.  This includes consultation notes, operative notes, office notes, lab results and pathology reports.  If you have any  questions about what you read please let us know at your next visit so we can discuss your concerns and take corrective action if need be.  We are right here with you.

## 2020-09-10 ENCOUNTER — Other Ambulatory Visit: Payer: Self-pay

## 2020-09-10 ENCOUNTER — Encounter (INDEPENDENT_AMBULATORY_CARE_PROVIDER_SITE_OTHER): Payer: Self-pay | Admitting: Family Medicine

## 2020-09-10 ENCOUNTER — Ambulatory Visit (INDEPENDENT_AMBULATORY_CARE_PROVIDER_SITE_OTHER): Payer: BC Managed Care – PPO | Admitting: Family Medicine

## 2020-09-10 ENCOUNTER — Other Ambulatory Visit: Payer: Self-pay | Admitting: Rheumatology

## 2020-09-10 VITALS — BP 109/68 | HR 80 | Temp 97.6°F | Ht 68.0 in | Wt 245.0 lb

## 2020-09-10 DIAGNOSIS — Z9189 Other specified personal risk factors, not elsewhere classified: Secondary | ICD-10-CM

## 2020-09-10 DIAGNOSIS — E559 Vitamin D deficiency, unspecified: Secondary | ICD-10-CM | POA: Diagnosis not present

## 2020-09-10 DIAGNOSIS — E66812 Obesity, class 2: Secondary | ICD-10-CM

## 2020-09-10 DIAGNOSIS — Z6839 Body mass index (BMI) 39.0-39.9, adult: Secondary | ICD-10-CM

## 2020-09-10 MED ORDER — VITAMIN D (ERGOCALCIFEROL) 1.25 MG (50000 UNIT) PO CAPS: 50000.0000 [IU] | ORAL_CAPSULE | ORAL | 0 refills | Status: DC

## 2020-09-10 NOTE — Telephone Encounter (Signed)
Next Visit: 12/02/2020  Last Visit: 08/29/2020  Last Fill: 11/22/2020  Dx: Rheumatoid arthritis involving multiple sites with positive rheumatoid factor  Current Dose per office note on 11/03/3965: folic acid 2 mg po daily  Per protocol, okay to refill per Dr. Estanislado Pandy

## 2020-09-16 ENCOUNTER — Encounter: Payer: Self-pay | Admitting: Rheumatology

## 2020-09-16 ENCOUNTER — Other Ambulatory Visit: Payer: Self-pay | Admitting: *Deleted

## 2020-09-16 DIAGNOSIS — Z79899 Other long term (current) drug therapy: Secondary | ICD-10-CM

## 2020-09-17 DIAGNOSIS — Z79899 Other long term (current) drug therapy: Secondary | ICD-10-CM | POA: Diagnosis not present

## 2020-09-17 LAB — CBC WITH DIFFERENTIAL/PLATELET
Absolute Monocytes: 501 cells/uL (ref 200–950)
Basophils Absolute: 32 cells/uL (ref 0–200)
Basophils Relative: 0.7 %
Eosinophils Absolute: 92 cells/uL (ref 15–500)
Eosinophils Relative: 2 %
HCT: 39.9 % (ref 35.0–45.0)
Hemoglobin: 13.5 g/dL (ref 11.7–15.5)
Lymphs Abs: 984 cells/uL (ref 850–3900)
MCH: 30.7 pg (ref 27.0–33.0)
MCHC: 33.8 g/dL (ref 32.0–36.0)
MCV: 90.7 fL (ref 80.0–100.0)
MPV: 10.4 fL (ref 7.5–12.5)
Monocytes Relative: 10.9 %
Neutro Abs: 2990 cells/uL (ref 1500–7800)
Neutrophils Relative %: 65 %
Platelets: 199 10*3/uL (ref 140–400)
RBC: 4.4 10*6/uL (ref 3.80–5.10)
RDW: 13.6 % (ref 11.0–15.0)
Total Lymphocyte: 21.4 %
WBC: 4.6 10*3/uL (ref 3.8–10.8)

## 2020-09-17 LAB — COMPLETE METABOLIC PANEL WITH GFR
AG Ratio: 1.6 (calc) (ref 1.0–2.5)
ALT: 32 U/L — ABNORMAL HIGH (ref 6–29)
AST: 22 U/L (ref 10–35)
Albumin: 4.1 g/dL (ref 3.6–5.1)
Alkaline phosphatase (APISO): 63 U/L (ref 37–153)
BUN: 22 mg/dL (ref 7–25)
CO2: 25 mmol/L (ref 20–32)
Calcium: 9.3 mg/dL (ref 8.6–10.4)
Chloride: 105 mmol/L (ref 98–110)
Creat: 0.71 mg/dL (ref 0.50–1.03)
Globulin: 2.6 g/dL (calc) (ref 1.9–3.7)
Glucose, Bld: 87 mg/dL (ref 65–139)
Potassium: 4.3 mmol/L (ref 3.5–5.3)
Sodium: 137 mmol/L (ref 135–146)
Total Bilirubin: 0.6 mg/dL (ref 0.2–1.2)
Total Protein: 6.7 g/dL (ref 6.1–8.1)
eGFR: 99 mL/min/{1.73_m2} (ref >=60)

## 2020-09-23 NOTE — Progress Notes (Signed)
Chief Complaint:   OBESITY Brittany Malone is here to discuss her progress with her obesity treatment plan along with follow-up of her obesity related diagnoses.   Today's visit was #: 2 Starting weight: 259 lbs Starting date: 08/12/2020 Today's weight: 245 lbs Today's date: 09/10/2020 Weight change since last visit: 5 lbs Total lbs lost to date: 14 lbs Body mass index is 37.25 kg/m.  Total weight loss percentage to date: -5.41%  Interim History:  Brittany Malone has been trying to find creative ways to make meats.  Meal plan going well.  No issues.  Plan:  Seasoning handouts given to patient.  Discussed food prep.  Current Meal Plan: the Category 3 Plan for 100% of the time.  Current Exercise Plan: Walking for 30 minutes 7 times per week.  Assessment/Plan:   Medications Discontinued During This Encounter  Medication Reason   Vitamin D, Ergocalciferol, (DRISDOL) 1.25 MG (50000 UNIT) CAPS capsule Reorder   Meds ordered this encounter  Medications   Vitamin D, Ergocalciferol, (DRISDOL) 1.25 MG (50000 UNIT) CAPS capsule    Sig: Take 1 capsule (50,000 Units total) by mouth every 7 (seven) days.    Dispense:  4 capsule    Refill:  0    1 mo script; ov for RF   1. Vitamin D deficiency Not at goal.  She is taking vitamin D 50,000 IU weekly.  Plan: Continue to take prescription Vitamin D '@50'$ ,000 IU every week as prescribed.  Follow-up for routine testing of Vitamin D, at least 2-3 times per year to avoid over-replacement.  Lab Results  Component Value Date   VD25OH 19.4 (L) 08/12/2020   - Refill Vitamin D, Ergocalciferol, (DRISDOL) 1.25 MG (50000 UNIT) CAPS capsule; Take 1 capsule (50,000 Units total) by mouth every 7 (seven) days.  Dispense: 4 capsule; Refill: 0  2. At risk for impaired metabolic function Due to Brittany Malone's current state of health and medical condition(s), she is at a significantly higher risk for impaired metabolic function.  She is on methotrexate and is followed by  Rheumatology.  Recent CMP showed normalized ALT/AST.  At least 9 minutes was spent on counseling Brittany Malone about these concerns today.  This places the patient at a much greater risk to subsequently develop cardio-pulmonary conditions that can negatively affect the patient's quality of life.  I stressed the importance of reversing these risks factors.  The initial goal is to lose at least 5-10% of starting weight to help reduce risk factors.  Counseling:  Intensive lifestyle modifications discussed with Brittany Malone as the most appropriate first line treatment.  she will continue to work on diet, exercise, and weight loss efforts.  We will continue to reassess these conditions on a fairly regular basis in an attempt to decrease the patient's overall morbidity and mortality.  3. Class 2 severe obesity with serious comorbidity and body mass index (BMI) of 39.0 to 39.9 in adult, unspecified obesity type Brittany Malone)  Course: Brittany Malone is currently in the action stage of change. As such, her goal is to continue with weight loss efforts.   Nutrition goals: She has agreed to the Category 3 Plan.   Exercise goals:  As is.  Behavioral modification strategies: meal planning and cooking strategies.  Brittany Malone has agreed to follow-up with our clinic in 2-3 weeks. She was informed of the importance of frequent follow-up visits to maximize her success with intensive lifestyle modifications for her multiple health conditions.   Objective:   Blood pressure 109/68, pulse 80, temperature 97.6  F (36.4 C), height '5\' 8"'$  (1.727 m), weight 245 lb (111.1 kg), SpO2 97 %. Body mass index is 37.25 kg/m.  General: Cooperative, alert, well developed, in no acute distress. HEENT: Conjunctivae and lids unremarkable. Cardiovascular: Regular rhythm.  Lungs: Normal work of breathing. Neurologic: No focal deficits.   Lab Results  Component Value Date   CREATININE 0.71 09/17/2020   BUN 22 09/17/2020   NA 137 09/17/2020   K 4.3 09/17/2020   CL  105 09/17/2020   CO2 25 09/17/2020   Lab Results  Component Value Date   ALT 32 (H) 09/17/2020   AST 22 09/17/2020   ALKPHOS 53 06/16/2020   BILITOT 0.6 09/17/2020   Lab Results  Component Value Date   HGBA1C 5.4 06/16/2020   Lab Results  Component Value Date   INSULIN 9.9 08/12/2020   Lab Results  Component Value Date   TSH 2.200 08/12/2020   Lab Results  Component Value Date   CHOL 127 06/16/2020   HDL 60.00 06/16/2020   LDLCALC 51 06/16/2020   TRIG 80.0 06/16/2020   CHOLHDL 2 06/16/2020   Lab Results  Component Value Date   VD25OH 19.4 (L) 08/12/2020   Lab Results  Component Value Date   WBC 4.6 09/17/2020   HGB 13.5 09/17/2020   HCT 39.9 09/17/2020   MCV 90.7 09/17/2020   PLT 199 09/17/2020   Lab Results  Component Value Date   IRON 116 04/16/2020   FERRITIN 94.8 04/16/2020   Attestation Statements:   Reviewed by clinician on day of visit: allergies, medications, problem list, medical history, surgical history, family history, social history, and previous encounter notes.  I, Water quality scientist, CMA, am acting as Location manager for [employer], DO.  I have reviewed the above documentation for accuracy and completeness, and I agree with the above. Marjory Sneddon, D.O.  The [place] was signed into law in 2016 which includes the topic of electronic health records.  This provides immediate access to information in MyChart.  This includes consultation notes, operative notes, office notes, lab results and pathology reports.  If you have any questions about what you read please let us know at your next visit so we can discuss your concerns and take corrective action if need be.  We are right here with you.

## 2020-09-30 ENCOUNTER — Other Ambulatory Visit: Payer: Self-pay

## 2020-09-30 ENCOUNTER — Ambulatory Visit (INDEPENDENT_AMBULATORY_CARE_PROVIDER_SITE_OTHER): Payer: BC Managed Care – PPO | Admitting: Family Medicine

## 2020-09-30 ENCOUNTER — Encounter (INDEPENDENT_AMBULATORY_CARE_PROVIDER_SITE_OTHER): Payer: Self-pay | Admitting: Family Medicine

## 2020-09-30 VITALS — BP 115/79 | HR 86 | Temp 97.9°F | Ht 68.0 in | Wt 239.0 lb

## 2020-09-30 DIAGNOSIS — K5909 Other constipation: Secondary | ICD-10-CM | POA: Diagnosis not present

## 2020-09-30 DIAGNOSIS — Z6839 Body mass index (BMI) 39.0-39.9, adult: Secondary | ICD-10-CM

## 2020-09-30 DIAGNOSIS — Z9189 Other specified personal risk factors, not elsewhere classified: Secondary | ICD-10-CM

## 2020-09-30 DIAGNOSIS — E559 Vitamin D deficiency, unspecified: Secondary | ICD-10-CM | POA: Diagnosis not present

## 2020-09-30 MED ORDER — POLYETHYLENE GLYCOL 3350 17 G PO PACK: 17.0000 g | PACK | Freq: Every day | ORAL | 0 refills | Status: AC | PRN

## 2020-10-01 NOTE — Progress Notes (Signed)
Chief Complaint:   OBESITY Brittany Malone is here to discuss her progress with her obesity treatment plan along with follow-up of her obesity related diagnoses. Brittany Malone is on the Category 3 Plan and states she is following her eating plan approximately 98% of the time. Brittany Malone states she is walking, weight training, and swimming 30 minutes 7 times per week.  Today's visit was #: 3 Starting weight: 259 lbs Starting date: 08/12/2020 Today's weight: 239 lbs Today's date: 09/30/2020 Total lbs lost to date: 20 Total lbs lost since last in-office visit: 6  Interim History: Brittany Malone is here for a follow up office visit.  We reviewed her meal plan and questions were answered.  Patient's food recall appears to be accurate and consistent with what is on plan when she is following it.   When eating on plan, her hunger and cravings are well controlled. She increased exercise to 30 minutes or more daily.  Subjective:   1. Vitamin D deficiency Brittany Malone is currently taking prescription vitamin D 50,000 IU each week with excellent compliance. She denies nausea, vomiting or muscle weakness.  Lab Results  Component Value Date   VD25OH 19.4 (L) 08/12/2020   2. Other constipation Brittany Malone currently drinks approximately 100 oz of water daily.   Assessment/Plan:   1. Vitamin D deficiency Low Vitamin D level contributes to fatigue and are associated with obesity, breast, and colon cancer. She agrees to continue to take prescription Vitamin D '@50'$ ,000 IU every week and will follow-up for routine testing of Vitamin D, at least 2-3 times per year to avoid over-replacement.  2. Other constipation Brittany Malone was informed that a decrease in bowel movement frequency is normal while losing weight, but stools should not be hard or painful. Orders and follow up as documented in patient record.   Counseling Getting to Good Bowel Health: Your goal is to have one soft bowel movement each day. Drink at least 8 glasses of water  each day. Eat plenty of fiber (goal is over 25 grams each day). It is best to get most of your fiber from dietary sources which includes leafy green vegetables, fresh fruit, and whole grains. You may need to add fiber with the help of OTC fiber supplements. These include Metamucil, Citrucel, and Flaxseed. If you are still having trouble, try adding Miralax or Magnesium Citrate. If all of these changes do not work, Cabin crew.  Start- polyethylene glycol (MIRALAX / GLYCOLAX) 17 g packet; Take 17 g by mouth daily as needed.  Dispense: 30 packet; Refill: 0  3. At risk for dehydration Brittany Malone is at higher than average risk of dehydration due to poor water intake.  Brittany Malone was given more than 9 minutes of proper hydration counseling today.  We discussed the signs and symptoms of dehydration, some of which may include muscle cramping, constipation or even orthostatic symptoms.  Counseling on the prevention of dehydration was also provided today.  Brittany Malone is at risk for dehydration due to weight loss, lifestyle and behavorial habits and possibly due to taking certain medication(s).  She was encouraged to adequately hydrate and monitor fluid status to avoid dehydration as well as weight loss plateaus.  Unless pre-existing renal or cardiopulmonary conditions exist, in which patient was told to limit their fluid intake, I recommended roughly one half of their weight in pounds to be the approximate ounces of non-caloric, non-caffeinated beverages they should drink per day; including more if they are engaging in exercise.  4. Obesity with current  BMI of 36.3  Brittany Malone is currently in the action stage of change. As such, her goal is to continue with weight loss efforts. She has agreed to the Category 3 Plan.   Exercise goals:  As is  Behavioral modification strategies: meal planning, cooking strategies, and recipe ideas  Brittany Malone has agreed to follow-up with our clinic in 2-3 weeks. She was informed of the  importance of frequent follow-up visits to maximize her success with intensive lifestyle modifications for her multiple health conditions.   Objective:   Blood pressure 115/79, pulse 86, temperature 97.9 F (36.6 C), height '5\' 8"'$  (1.727 m), weight 239 lb (108.4 kg), SpO2 94 %. Body mass index is 36.34 kg/m.  General: Cooperative, alert, well developed, in no acute distress. HEENT: Conjunctivae and lids unremarkable. Cardiovascular: Regular rhythm.  Lungs: Normal work of breathing. Neurologic: No focal deficits.   Lab Results  Component Value Date   CREATININE 0.71 09/17/2020   BUN 22 09/17/2020   NA 137 09/17/2020   K 4.3 09/17/2020   CL 105 09/17/2020   CO2 25 09/17/2020   Lab Results  Component Value Date   ALT 32 (H) 09/17/2020   AST 22 09/17/2020   ALKPHOS 53 06/16/2020   BILITOT 0.6 09/17/2020   Lab Results  Component Value Date   HGBA1C 5.4 06/16/2020   Lab Results  Component Value Date   INSULIN 9.9 08/12/2020   Lab Results  Component Value Date   TSH 2.200 08/12/2020   Lab Results  Component Value Date   CHOL 127 06/16/2020   HDL 60.00 06/16/2020   LDLCALC 51 06/16/2020   TRIG 80.0 06/16/2020   CHOLHDL 2 06/16/2020   Lab Results  Component Value Date   VD25OH 19.4 (L) 08/12/2020   Lab Results  Component Value Date   WBC 4.6 09/17/2020   HGB 13.5 09/17/2020   HCT 39.9 09/17/2020   MCV 90.7 09/17/2020   PLT 199 09/17/2020   Lab Results  Component Value Date   IRON 116 04/16/2020   FERRITIN 94.8 04/16/2020    Attestation Statements:   Reviewed by clinician on day of visit: allergies, medications, problem list, medical history, surgical history, family history, social history, and previous encounter notes.  Coral Ceo, CMA, am acting as transcriptionist for [employer], DO.  I have reviewed the above documentation for accuracy and completeness, and I agree with the above. Marjory Sneddon, D.O.  The [place] was signed into law in 2016 which includes the topic of electronic health records.  This provides immediate access to information in MyChart.  This includes consultation notes, operative notes, office notes, lab results and pathology reports.  If you have any questions about what you read please let us know at your next visit so we can discuss your concerns and take corrective action if need be.  We are right here with you.

## 2020-10-20 ENCOUNTER — Encounter (INDEPENDENT_AMBULATORY_CARE_PROVIDER_SITE_OTHER): Payer: Self-pay

## 2020-10-20 ENCOUNTER — Ambulatory Visit (INDEPENDENT_AMBULATORY_CARE_PROVIDER_SITE_OTHER): Payer: BC Managed Care – PPO | Admitting: Family Medicine

## 2020-11-17 ENCOUNTER — Telehealth: Payer: BC Managed Care – PPO | Admitting: Physician Assistant

## 2020-11-17 DIAGNOSIS — U071 COVID-19: Secondary | ICD-10-CM

## 2020-11-17 MED ORDER — MOLNUPIRAVIR EUA 200MG CAPSULE: 4.0000 | ORAL_CAPSULE | Freq: Two times a day (BID) | ORAL | 0 refills | Status: AC

## 2020-11-17 MED ORDER — ALBUTEROL SULFATE HFA 108 (90 BASE) MCG/ACT IN AERS
2.0000 | INHALATION_SPRAY | Freq: Four times a day (QID) | RESPIRATORY_TRACT | 0 refills | Status: DC | PRN
Start: 2020-11-17 — End: 2023-03-23

## 2020-11-17 MED ORDER — BENZONATATE 100 MG PO CAPS
100.0000 mg | ORAL_CAPSULE | Freq: Three times a day (TID) | ORAL | 0 refills | Status: DC | PRN
Start: 2020-11-17 — End: 2020-12-02

## 2020-11-17 MED ORDER — PROMETHAZINE-DM 6.25-15 MG/5ML PO SYRP: 5.0000 mL | ORAL_SOLUTION | Freq: Four times a day (QID) | ORAL | 0 refills | Status: DC | PRN

## 2020-11-17 NOTE — Patient Instructions (Signed)
Hello Brittany Malone,  You are being placed in the home monitoring program for COVID-19 (commonly known as Coronavirus).  This is because you are suspected to have the virus or are known to have the virus.  If you are unsure which group you fall into call your clinic.    As part of this program, you'll answer a daily questionnaire in the MyChart mobile app. You'll receive a notification through the MyChart app when the questionnaire is available. When you log in to MyChart, you'll see the tasks in your To Do activity.       Clinicians will see any answers that are concerning and take appropriate steps.  If at any point you are having a medical emergency, call 911.  If otherwise concerned call your clinic instead of coming into the clinic or hospital.  To keep from spreading the disease you should: Stay home and limit contact with other people as much as possible.  Wash your hands frequently. Cover your coughs and sneezes with a tissue, and throw used tissues in the trash.   Clean and disinfect frequently touched surfaces and objects.    Take care of yourself by: Staying home Resting Drinking fluids Take fever-reducing medications (Tylenol/Acetaminophen and Ibuprofen)  For more information on the disease go to the Centers for Disease Control and Prevention website     You are being prescribed MOLNUPIRAVIR for COVID-19 infection.   Please call the pharmacy or go through the drive through vs going inside if you are picking up the mediation yourself to prevent further spread. If prescribed to a [facility] affiliated pharmacy, a pharmacist will bring the medication out to your car.   ADMINISTRATION INSTRUCTIONS: Take with or without food. Swallow the tablets whole. Don't chew, crush, or break the medications because it might not work as well  For each dose of the medication, you should be taking FOUR tablets at one time, TWICE a day   Finish your full five-day course of Molnupiravir even if you  feel better before you're done. Stopping this medication too early can make it less effective to prevent severe illness related to [place].    Molnupiravir is prescribed for YOU ONLY. Don't share it with others, even if they have similar symptoms as you. This medication might not be right for everyone.   Make sure to take steps to protect yourself and others while you're taking this medication in order to get well soon and to prevent others from getting sick with COVID-19.   **If you are of childbearing potential (any gender) - it is advised to not get pregnant while taking this medication and recommended that condoms are used for female partners the next 3 months after taking the medication out of extreme caution    COMMON SIDE EFFECTS: Diarrhea Nausea  Dizziness    If your COVID-19 symptoms get worse, get medical help right away. Call 911 if you experience symptoms such as worsening cough, trouble breathing, chest pain that doesn't go away, confusion, a hard time staying awake, and pale or blue-colored skin. This medication won't prevent all COVID-19 cases from getting worse.   Can take to lessen severity: Vit C 500mg  twice daily Quercertin 250-500mg  twice daily Zinc 75-100mg  daily Melatonin 3-6 mg at bedtime Vit D3 1000-2000 IU daily Aspirin 81 mg daily with food Optional: Famotidine 20mg  daily Also can add tylenol/ibuprofen as needed for fevers and body aches May add Mucinex or Mucinex DM as needed for cough/congestion  10 Things You Can Do  to Manage Your COVID-19 Symptoms at Home If you have possible or confirmed COVID-19 Stay home except to get medical care. Monitor your symptoms carefully. If your symptoms get worse, call your healthcare provider immediately. Get rest and stay hydrated. If you have a medical appointment, call the healthcare provider ahead of time and tell them that you have or may have COVID-19. For medical emergencies, call 911 and notify the dispatch  personnel that you have or may have COVID-19. Cover your cough and sneezes with a tissue or use the inside of your elbow. Wash your hands often with soap and water for at least 20 seconds or clean your hands with an alcohol-based hand sanitizer that contains at least 60% alcohol. As much as possible, stay in a specific room and away from other people in your home. Also, you should use a separate bathroom, if available. If you need to be around other people in or outside of the home, wear a mask. Avoid sharing personal items with other people in your household, like dishes, towels, and bedding. Clean all surfaces that are touched often, like counters, tabletops, and doorknobs. Use household cleaning sprays or wipes according to the label instructions. cdc.gov/coronavirus 09/14/2019 This information is not intended to replace advice given to you by your health care provider. Make sure you discuss any questions you have with your health care provider. Document Revised: 07/03/2020 Document Reviewed: 07/03/2020 Elsevier Patient Education  2022 [employer]  

## 2020-11-17 NOTE — Progress Notes (Signed)
Virtual Visit Consent   Brittany Malone, you are scheduled for a virtual visit with a [facility] provider today.     Just as with appointments in the office, your consent must be obtained to participate.  Your consent will be active for this visit and any virtual visit you may have with one of our providers in the next 365 days.     If you have a MyChart account, a copy of this consent can be sent to you electronically.  All virtual visits are billed to your insurance company just like a traditional visit in the office.    As this is a virtual visit, video technology does not allow for your provider to perform a traditional examination.  This may limit your provider's ability to fully assess your condition.  If your provider identifies any concerns that need to be evaluated in person or the need to arrange testing (such as labs, EKG, etc.), we will make arrangements to do so.     Although advances in technology are sophisticated, we cannot ensure that it will always work on either your end or our end.  If the connection with a video visit is poor, the visit may have to be switched to a telephone visit.  With either a video or telephone visit, we are not always able to ensure that we have a secure connection.     I need to obtain your verbal consent now.   Are you willing to proceed with your visit today?    Brittany Malone has provided verbal consent on 11/17/2020 for a virtual visit (video or telephone).   Mar Daring, PA-C   Date: 11/17/2020 8:57 AM   Virtual Visit via Video Note   I, Mar Daring, connected with  Brittany Malone  ([id number], June 06, 1965) on 11/17/20 at  8:45 AM EDT by a video-enabled telemedicine application and verified that I am speaking with the correct person using two identifiers.  Location: Patient: Virtual Visit Location Patient: Home Provider: Virtual Visit Location Provider: Home Office   I discussed the limitations of evaluation and management  by telemedicine and the availability of in person appointments. The patient expressed understanding and agreed to proceed.    History of Present Illness: Brittany Malone is a 55 y.o. who identifies as a female who was assigned female at birth, and is being seen today for Covid 9.  HPI: URI  This is a new problem. The current episode started in the past 7 days (Symptoms started on 11/15/20, tested positive same day). The problem has been gradually worsening. There has been no fever. Associated symptoms include congestion, coughing, headaches, a plugged ear sensation, rhinorrhea, sinus pain, a sore throat and wheezing. Pertinent negatives include no diarrhea, ear pain, nausea or vomiting. Treatments tried: sudafed, mucinex, nasal rinse, vit C. The treatment provided no relief.  O2 sat 98%   Problems:  Patient Active Problem List   Diagnosis Date Noted   Rheumatoid arthritis ([place])    NAFLD (nonalcoholic fatty liver disease)    GERD (gastroesophageal reflux disease)    Rheumatoid arthritis involving multiple sites with positive rheumatoid factor ([place]) 08/15/2018   Family history of rheumatoid arthritis 08/15/2018   Chronic uveitis of both eyes 08/15/2018   Pes cavus 08/15/2018   History of gastroesophageal reflux (GERD) 08/15/2018    Allergies:  Allergies  Allergen Reactions   Penicillins Anaphylaxis   Medications:  Current Outpatient Medications:    albuterol (VENTOLIN HFA) 108 (90 Base) MCG/ACT inhaler, Inhale  2 puffs into the lungs every 6 (six) hours as needed for wheezing or shortness of breath., Disp: 8 g, Rfl: 0   benzonatate (TESSALON) 100 MG capsule, Take 1 capsule (100 mg total) by mouth 3 (three) times daily as needed., Disp: 30 capsule, Rfl: 0   molnupiravir EUA (LAGEVRIO) 200 mg CAPS capsule, Take 4 capsules (800 mg total) by mouth 2 (two) times daily for 5 days., Disp: 40 capsule, Rfl: 0   promethazine-dextromethorphan (PROMETHAZINE-DM) 6.25-15 MG/5ML syrup, Take 5 mLs by  mouth 4 (four) times daily as needed for cough., Disp: 118 mL, Rfl: 0   acetaminophen (TYLENOL) 325 MG tablet, Take 650 mg by mouth every 6 (six) hours as needed., Disp: , Rfl:    amitriptyline (ELAVIL) 50 MG tablet, Take 1 tablet (50 mg total) by mouth at bedtime as needed for sleep., Disp: 90 tablet, Rfl: 2   ferrous sulfate 325 (65 FE) MG tablet, Take by mouth., Disp: , Rfl:    fluticasone (FLONASE) 50 MCG/ACT nasal spray, Place into both nostrils as needed for allergies or rhinitis., Disp: , Rfl:    folic acid (FOLVITE) 1 MG tablet, TAKE 2 TABLETS BY MOUTH EVERY DAY, Disp: 180 tablet, Rfl: 2   hydroxychloroquine (PLAQUENIL) 200 MG tablet, Take 1 tablet (200 mg total) by mouth daily., Disp: 90 tablet, Rfl: 0   MELATONIN PO, Take by mouth as needed., Disp: , Rfl:    methotrexate 50 MG/2ML injection, Inject 1 mL (25 mg total) into the skin once a week., Disp: 12 mL, Rfl: 0   pantoprazole (PROTONIX) 40 MG tablet, Take 1 tablet (40 mg total) by mouth 2 (two) times daily. (Patient taking differently: Take 40 mg by mouth daily.), Disp: 90 tablet, Rfl: 3   polyethylene glycol (MIRALAX / GLYCOLAX) 17 g packet, Take 17 g by mouth daily as needed., Disp: 30 packet, Rfl: 0   TUBERCULIN SYR 1CC/27GX1/2" 27G X 1/2" 1 ML MISC, Use 1 syringe once weekly to inject methotrexate., Disp: 12 each, Rfl: 2   vitamin B-12 (CYANOCOBALAMIN) 500 MCG tablet, Take 1 tablet (500 mcg total) by mouth daily., Disp: , Rfl:    Vitamin D, Ergocalciferol, (DRISDOL) 1.25 MG (50000 UNIT) CAPS capsule, Take 1 capsule (50,000 Units total) by mouth every 7 (seven) days., Disp: 4 capsule, Rfl: 0  Observations/Objective: Patient is well-developed, well-nourished in no acute distress.  Resting comfortably at home.  Head is normocephalic, atraumatic.  No labored breathing. Speech is clear and coherent with logical content.  Patient is alert and oriented at baseline.  Frequent, deep, bronchial cough heard  Assessment and Plan: 1.  COVID-19 - benzonatate (TESSALON) 100 MG capsule; Take 1 capsule (100 mg total) by mouth 3 (three) times daily as needed.  Dispense: 30 capsule; Refill: 0 - promethazine-dextromethorphan (PROMETHAZINE-DM) 6.25-15 MG/5ML syrup; Take 5 mLs by mouth 4 (four) times daily as needed for cough.  Dispense: 118 mL; Refill: 0 - albuterol (VENTOLIN HFA) 108 (90 Base) MCG/ACT inhaler; Inhale 2 puffs into the lungs every 6 (six) hours as needed for wheezing or shortness of breath.  Dispense: 8 g; Refill: 0 - molnupiravir EUA (LAGEVRIO) 200 mg CAPS capsule; Take 4 capsules (800 mg total) by mouth 2 (two) times daily for 5 days.  Dispense: 40 capsule; Refill: 0 - MyChart COVID-19 home monitoring program; Future - Continue OTC symptomatic management of choice - Will send OTC vitamins and supplement information through AVS - Molnupiravir prescribed - Tessalon perles and promethazine DM for cough, albuterol for wheezing -  Patient enrolled in MyChart symptom monitoring - Push fluids - Rest as needed - Discussed return precautions and when to seek in-person evaluation, sent via AVS as well  Follow Up Instructions: I discussed the assessment and treatment plan with the patient. The patient was provided an opportunity to ask questions and all were answered. The patient agreed with the plan and demonstrated an understanding of the instructions.  A copy of instructions were sent to the patient via MyChart.  The patient was advised to call back or seek an in-person evaluation if the symptoms worsen or if the condition fails to improve as anticipated.  Time:  I spent 15 minutes with the patient via telehealth technology discussing the above problems/concerns.    Mar Daring, PA-C

## 2020-11-18 NOTE — Progress Notes (Deleted)
Office Visit Note  Patient: Brittany Malone             Date of Birth: [birth year]           MRN: [id number]             PCP: Shelda Pal, DO Referring: Shelda Pal* Visit Date: 12/02/2020 Occupation: @GUAROCC @  Subjective:  No chief complaint on file.   History of Present Illness: Brittany Malone is a 55 y.o. female ***   Activities of Daily Living:  Patient reports morning stiffness for *** {minute/hour:19697}.   Patient {ACTIONS;DENIES/REPORTS:21021675::"Denies"} nocturnal pain.  Difficulty dressing/grooming: {ACTIONS;DENIES/REPORTS:21021675::"Denies"} Difficulty climbing stairs: {ACTIONS;DENIES/REPORTS:21021675::"Denies"} Difficulty getting out of chair: {ACTIONS;DENIES/REPORTS:21021675::"Denies"} Difficulty using hands for taps, buttons, cutlery, and/or writing: {ACTIONS;DENIES/REPORTS:21021675::"Denies"}  No Rheumatology ROS completed.   PMFS History:  Patient Active Problem List   Diagnosis Date Noted   Rheumatoid arthritis ([place])    NAFLD (nonalcoholic fatty liver disease)    GERD (gastroesophageal reflux disease)    Rheumatoid arthritis involving multiple sites with positive rheumatoid factor ([place]) 08/15/2018   Family history of rheumatoid arthritis 08/15/2018   Chronic uveitis of both eyes 08/15/2018   Pes cavus 08/15/2018   History of gastroesophageal reflux (GERD) 08/15/2018    Past Medical History:  Diagnosis Date   Allergy    SEASONAL   Anemia    Arthritis    Back pain    Diverticulitis    Fatty liver    per patient   GERD (gastroesophageal reflux disease)    Insomnia    Joint pain    NAFLD (nonalcoholic fatty liver disease)    Rheumatoid arthritis ([place])     Family History  Problem Relation Age of Onset   Lupus Mother    Fibromyalgia Mother    Hyperlipidemia Mother    Hypertension Father    High Cholesterol Father    Hyperlipidemia Father    Colon polyps Father    Kidney Stones Brother    Healthy Son     Hypertension Daughter    Migraines Daughter    Colon cancer Neg Hx    Esophageal cancer Neg Hx    Rectal cancer Neg Hx    Stomach cancer Neg Hx    Past Surgical History:  Procedure Laterality Date   ABDOMINAL HYSTERECTOMY     ANKLE SURGERY     COLONOSCOPY  01/18/2020   UPPER GI ENDOSCOPY     Social History   Social History Narrative   Not on file   Immunization History  Administered Date(s) Administered   Influenza,inj,Quad PF,6+ Mos 11/16/2018   Moderna Sars-Covid-2 Vaccination 03/08/2019, 04/05/2019, 11/23/2019, 06/02/2020   Pneumococcal Polysaccharide-23 11/16/2018   Tdap 05/16/2010, 06/16/2020   Zoster Recombinat (Shingrix) 11/16/2018, 01/16/2019     Objective: Vital Signs: There were no vitals taken for this visit.   Physical Exam   Musculoskeletal Exam: ***  CDAI Exam: CDAI Score: -- Patient Global: --; Provider Global: -- Swollen: --; Tender: -- Joint Exam 12/02/2020   No joint exam has been documented for this visit   There is currently no information documented on the homunculus. Go to the Rheumatology activity and complete the homunculus joint exam.  Investigation: No additional findings.  Imaging: No results found.  Recent Labs: Lab Results  Component Value Date   WBC 4.6 09/17/2020   HGB 13.5 09/17/2020   PLT 199 09/17/2020   NA 137 09/17/2020   K 4.3 09/17/2020   CL 105 09/17/2020   CO2 25 09/17/2020   GLUCOSE  87 09/17/2020   BUN 22 09/17/2020   CREATININE 0.71 09/17/2020   BILITOT 0.6 09/17/2020   ALKPHOS 53 06/16/2020   AST 22 09/17/2020   ALT 32 (H) 09/17/2020   PROT 6.7 09/17/2020   ALBUMIN 3.8 06/16/2020   CALCIUM 9.3 09/17/2020   GFRAA 114 08/26/2020   QFTBGOLDPLUS NEGATIVE 11/20/2019    Speciality Comments: PLQ Eye Exam: 06/02/2020 WNL with Pre-existing Toxo Scars @ [facility] Follow up  in 1 year.  Procedures:  No procedures performed Allergies: Penicillins   Assessment / Plan:     Visit Diagnoses:  No diagnosis found.  Orders: No orders of the defined types were placed in this encounter.  No orders of the defined types were placed in this encounter.   Face-to-face time spent with patient was *** minutes. Greater than 50% of time was spent in counseling and coordination of care.  Follow-Up Instructions: No follow-ups on file.   Earnestine Mealing, CMA  Note - This record has been created using Editor, commissioning.  Chart creation errors have been sought, but may not always  have been located. Such creation errors do not reflect on  the standard of medical care.

## 2020-12-02 ENCOUNTER — Telehealth: Payer: BC Managed Care – PPO | Admitting: Physician Assistant

## 2020-12-02 ENCOUNTER — Ambulatory Visit: Payer: BC Managed Care – PPO | Admitting: Rheumatology

## 2020-12-02 DIAGNOSIS — Z85820 Personal history of malignant melanoma of skin: Secondary | ICD-10-CM

## 2020-12-02 DIAGNOSIS — M0579 Rheumatoid arthritis with rheumatoid factor of multiple sites without organ or systems involvement: Secondary | ICD-10-CM

## 2020-12-02 DIAGNOSIS — Z79899 Other long term (current) drug therapy: Secondary | ICD-10-CM

## 2020-12-02 DIAGNOSIS — J019 Acute sinusitis, unspecified: Secondary | ICD-10-CM | POA: Diagnosis not present

## 2020-12-02 DIAGNOSIS — G8929 Other chronic pain: Secondary | ICD-10-CM

## 2020-12-02 DIAGNOSIS — M170 Bilateral primary osteoarthritis of knee: Secondary | ICD-10-CM

## 2020-12-02 DIAGNOSIS — Z8269 Family history of other diseases of the musculoskeletal system and connective tissue: Secondary | ICD-10-CM

## 2020-12-02 DIAGNOSIS — M4802 Spinal stenosis, cervical region: Secondary | ICD-10-CM

## 2020-12-02 DIAGNOSIS — J208 Acute bronchitis due to other specified organisms: Secondary | ICD-10-CM | POA: Diagnosis not present

## 2020-12-02 DIAGNOSIS — B9689 Other specified bacterial agents as the cause of diseases classified elsewhere: Secondary | ICD-10-CM

## 2020-12-02 DIAGNOSIS — Z8719 Personal history of other diseases of the digestive system: Secondary | ICD-10-CM

## 2020-12-02 DIAGNOSIS — H2013 Chronic iridocyclitis, bilateral: Secondary | ICD-10-CM

## 2020-12-02 DIAGNOSIS — Q667 Congenital pes cavus, unspecified foot: Secondary | ICD-10-CM

## 2020-12-02 MED ORDER — PREDNISONE 20 MG PO TABS
40.0000 mg | ORAL_TABLET | Freq: Every day | ORAL | 0 refills | Status: DC
Start: 2020-12-02 — End: 2020-12-23

## 2020-12-02 MED ORDER — BENZONATATE 100 MG PO CAPS: 100.0000 mg | ORAL_CAPSULE | Freq: Three times a day (TID) | ORAL | 0 refills | Status: DC | PRN

## 2020-12-02 MED ORDER — AZITHROMYCIN 250 MG PO TABS: ORAL_TABLET | ORAL | 0 refills | Status: DC

## 2020-12-02 NOTE — Progress Notes (Signed)
Virtual Visit Consent   Brittany Malone, you are scheduled for a virtual visit with a [facility] provider today.     Just as with appointments in the office, your consent must be obtained to participate.  Your consent will be active for this visit and any virtual visit you may have with one of our providers in the next 365 days.     If you have a MyChart account, a copy of this consent can be sent to you electronically.  All virtual visits are billed to your insurance company just like a traditional visit in the office.    As this is a virtual visit, video technology does not allow for your provider to perform a traditional examination.  This may limit your provider's ability to fully assess your condition.  If your provider identifies any concerns that need to be evaluated in person or the need to arrange testing (such as labs, EKG, etc.), we will make arrangements to do so.     Although advances in technology are sophisticated, we cannot ensure that it will always work on either your end or our end.  If the connection with a video visit is poor, the visit may have to be switched to a telephone visit.  With either a video or telephone visit, we are not always able to ensure that we have a secure connection.     I need to obtain your verbal consent now.   Are you willing to proceed with your visit today?    Brittany Malone has provided verbal consent on 12/02/2020 for a virtual visit (video or telephone).   Mar Daring, PA-C   Date: 12/02/2020 10:53 AM   Virtual Visit via Video Note   I, Mar Daring, connected with  Brittany Malone  ([id number], 12-11-1965) on 12/02/20 at 10:45 AM EDT by a video-enabled telemedicine application and verified that I am speaking with the correct person using two identifiers.  Location: Patient: Virtual Visit Location Patient: Other: work; isolated Provider: Scientist, research (medical) Provider: Home Office   I discussed the limitations of  evaluation and management by telemedicine and the availability of in person appointments. The patient expressed understanding and agreed to proceed.    History of Present Illness: Brittany Malone is a 55 y.o. who identifies as a female who was assigned female at birth, and is being seen today for continued sinus congestion, ear pain, and sore throat following covid 19. Felt bad for a week with Covid, then felt some better, but now having sinus congestion, ear pain/fullness, cough and chest congestion. It has been 3 weeks since positive test.   Problems:  Patient Active Problem List   Diagnosis Date Noted   Rheumatoid arthritis ([place])    NAFLD (nonalcoholic fatty liver disease)    GERD (gastroesophageal reflux disease)    Rheumatoid arthritis involving multiple sites with positive rheumatoid factor ([place]) 08/15/2018   Family history of rheumatoid arthritis 08/15/2018   Chronic uveitis of both eyes 08/15/2018   Pes cavus 08/15/2018   History of gastroesophageal reflux (GERD) 08/15/2018    Allergies:  Allergies  Allergen Reactions   Penicillins Anaphylaxis   Medications:  Current Outpatient Medications:    azithromycin (ZITHROMAX) 250 MG tablet, Take 2 tablets PO on day one, and one tablet PO daily thereafter until completed., Disp: 6 tablet, Rfl: 0   benzonatate (TESSALON) 100 MG capsule, Take 1 capsule (100 mg total) by mouth 3 (three) times daily as needed., Disp: 30 capsule, Rfl: 0  predniSONE (DELTASONE) 20 MG tablet, Take 2 tablets (40 mg total) by mouth daily with breakfast., Disp: 14 tablet, Rfl: 0   acetaminophen (TYLENOL) 325 MG tablet, Take 650 mg by mouth every 6 (six) hours as needed., Disp: , Rfl:    albuterol (VENTOLIN HFA) 108 (90 Base) MCG/ACT inhaler, Inhale 2 puffs into the lungs every 6 (six) hours as needed for wheezing or shortness of breath., Disp: 8 g, Rfl: 0   amitriptyline (ELAVIL) 50 MG tablet, Take 1 tablet (50 mg total) by mouth at bedtime as needed for sleep.,  Disp: 90 tablet, Rfl: 2   ferrous sulfate 325 (65 FE) MG tablet, Take by mouth., Disp: , Rfl:    fluticasone (FLONASE) 50 MCG/ACT nasal spray, Place into both nostrils as needed for allergies or rhinitis., Disp: , Rfl:    folic acid (FOLVITE) 1 MG tablet, TAKE 2 TABLETS BY MOUTH EVERY DAY, Disp: 180 tablet, Rfl: 2   hydroxychloroquine (PLAQUENIL) 200 MG tablet, Take 1 tablet (200 mg total) by mouth daily., Disp: 90 tablet, Rfl: 0   MELATONIN PO, Take by mouth as needed., Disp: , Rfl:    methotrexate 50 MG/2ML injection, Inject 1 mL (25 mg total) into the skin once a week., Disp: 12 mL, Rfl: 0   pantoprazole (PROTONIX) 40 MG tablet, Take 1 tablet (40 mg total) by mouth 2 (two) times daily. (Patient taking differently: Take 40 mg by mouth daily.), Disp: 90 tablet, Rfl: 3   polyethylene glycol (MIRALAX / GLYCOLAX) 17 g packet, Take 17 g by mouth daily as needed., Disp: 30 packet, Rfl: 0   promethazine-dextromethorphan (PROMETHAZINE-DM) 6.25-15 MG/5ML syrup, Take 5 mLs by mouth 4 (four) times daily as needed for cough., Disp: 118 mL, Rfl: 0   TUBERCULIN SYR 1CC/27GX1/2" 27G X 1/2" 1 ML MISC, Use 1 syringe once weekly to inject methotrexate., Disp: 12 each, Rfl: 2   vitamin B-12 (CYANOCOBALAMIN) 500 MCG tablet, Take 1 tablet (500 mcg total) by mouth daily., Disp: , Rfl:    Vitamin D, Ergocalciferol, (DRISDOL) 1.25 MG (50000 UNIT) CAPS capsule, Take 1 capsule (50,000 Units total) by mouth every 7 (seven) days., Disp: 4 capsule, Rfl: 0  Observations/Objective: Patient is well-developed, well-nourished in no acute distress.  Resting comfortably at home.  Head is normocephalic, atraumatic.  No labored breathing.  Speech is clear and coherent with logical content.  Patient is alert and oriented at baseline.  Dry, barking cough heard  Assessment and Plan: 1. Acute bacterial bronchitis - azithromycin (ZITHROMAX) 250 MG tablet; Take 2 tablets PO on day one, and one tablet PO daily thereafter until  completed.  Dispense: 6 tablet; Refill: 0 - predniSONE (DELTASONE) 20 MG tablet; Take 2 tablets (40 mg total) by mouth daily with breakfast.  Dispense: 14 tablet; Refill: 0 - benzonatate (TESSALON) 100 MG capsule; Take 1 capsule (100 mg total) by mouth 3 (three) times daily as needed.  Dispense: 30 capsule; Refill: 0  2. Acute bacterial sinusitis - azithromycin (ZITHROMAX) 250 MG tablet; Take 2 tablets PO on day one, and one tablet PO daily thereafter until completed.  Dispense: 6 tablet; Refill: 0 - predniSONE (DELTASONE) 20 MG tablet; Take 2 tablets (40 mg total) by mouth daily with breakfast.  Dispense: 14 tablet; Refill: 0 - benzonatate (TESSALON) 100 MG capsule; Take 1 capsule (100 mg total) by mouth 3 (three) times daily as needed.  Dispense: 30 capsule; Refill: 0  - Worsening, suspect second sickening following covid 19. - Will treat with zpak, prednisone  and tessalon perles.  - Push fluids.  - Rest.  - Call or seek in person evaluation if worsening or not improving with treatment.   Follow Up Instructions: I discussed the assessment and treatment plan with the patient. The patient was provided an opportunity to ask questions and all were answered. The patient agreed with the plan and demonstrated an understanding of the instructions.  A copy of instructions were sent to the patient via MyChart unless otherwise noted below.   The patient was advised to call back or seek an in-person evaluation if the symptoms worsen or if the condition fails to improve as anticipated.  Time:  I spent 9 minutes with the patient via telehealth technology discussing the above problems/concerns.    Mar Daring, PA-C

## 2020-12-02 NOTE — Patient Instructions (Signed)
Brittany Malone, thank you for joining Mar Daring, PA-C for today's virtual visit.  While this provider is not your primary care provider (PCP), if your PCP is located in our provider database this encounter information will be shared with them immediately following your visit.  Consent: (Patient) Brittany Malone provided verbal consent for this virtual visit at the beginning of the encounter.  Current Medications:  Current Outpatient Medications:    azithromycin (ZITHROMAX) 250 MG tablet, Take 2 tablets PO on day one, and one tablet PO daily thereafter until completed., Disp: 6 tablet, Rfl: 0   benzonatate (TESSALON) 100 MG capsule, Take 1 capsule (100 mg total) by mouth 3 (three) times daily as needed., Disp: 30 capsule, Rfl: 0   predniSONE (DELTASONE) 20 MG tablet, Take 2 tablets (40 mg total) by mouth daily with breakfast., Disp: 14 tablet, Rfl: 0   acetaminophen (TYLENOL) 325 MG tablet, Take 650 mg by mouth every 6 (six) hours as needed., Disp: , Rfl:    albuterol (VENTOLIN HFA) 108 (90 Base) MCG/ACT inhaler, Inhale 2 puffs into the lungs every 6 (six) hours as needed for wheezing or shortness of breath., Disp: 8 g, Rfl: 0   amitriptyline (ELAVIL) 50 MG tablet, Take 1 tablet (50 mg total) by mouth at bedtime as needed for sleep., Disp: 90 tablet, Rfl: 2   ferrous sulfate 325 (65 FE) MG tablet, Take by mouth., Disp: , Rfl:    fluticasone (FLONASE) 50 MCG/ACT nasal spray, Place into both nostrils as needed for allergies or rhinitis., Disp: , Rfl:    folic acid (FOLVITE) 1 MG tablet, TAKE 2 TABLETS BY MOUTH EVERY DAY, Disp: 180 tablet, Rfl: 2   hydroxychloroquine (PLAQUENIL) 200 MG tablet, Take 1 tablet (200 mg total) by mouth daily., Disp: 90 tablet, Rfl: 0   MELATONIN PO, Take by mouth as needed., Disp: , Rfl:    methotrexate 50 MG/2ML injection, Inject 1 mL (25 mg total) into the skin once a week., Disp: 12 mL, Rfl: 0   pantoprazole (PROTONIX) 40 MG tablet, Take 1 tablet (40 mg  total) by mouth 2 (two) times daily. (Patient taking differently: Take 40 mg by mouth daily.), Disp: 90 tablet, Rfl: 3   polyethylene glycol (MIRALAX / GLYCOLAX) 17 g packet, Take 17 g by mouth daily as needed., Disp: 30 packet, Rfl: 0   promethazine-dextromethorphan (PROMETHAZINE-DM) 6.25-15 MG/5ML syrup, Take 5 mLs by mouth 4 (four) times daily as needed for cough., Disp: 118 mL, Rfl: 0   TUBERCULIN SYR 1CC/27GX1/2" 27G X 1/2" 1 ML MISC, Use 1 syringe once weekly to inject methotrexate., Disp: 12 each, Rfl: 2   vitamin B-12 (CYANOCOBALAMIN) 500 MCG tablet, Take 1 tablet (500 mcg total) by mouth daily., Disp: , Rfl:    Vitamin D, Ergocalciferol, (DRISDOL) 1.25 MG (50000 UNIT) CAPS capsule, Take 1 capsule (50,000 Units total) by mouth every 7 (seven) days., Disp: 4 capsule, Rfl: 0   Medications ordered in this encounter:  Meds ordered this encounter  Medications   azithromycin (ZITHROMAX) 250 MG tablet    Sig: Take 2 tablets PO on day one, and one tablet PO daily thereafter until completed.    Dispense:  6 tablet    Refill:  0    Order Specific Question:   Supervising Provider    Answer:   MILLER, BRIAN [[id number]]   predniSONE (DELTASONE) 20 MG tablet    Sig: Take 2 tablets (40 mg total) by mouth daily with breakfast.    Dispense:  14  tablet    Refill:  0    Order Specific Question:   Supervising Provider    Answer:   Sabra Heck, BRIAN [[id number]]   benzonatate (TESSALON) 100 MG capsule    Sig: Take 1 capsule (100 mg total) by mouth 3 (three) times daily as needed.    Dispense:  30 capsule    Refill:  0    Order Specific Question:   Supervising Provider    Answer:   Sabra Heck, [place]     *If you need refills on other medications prior to your next appointment, please contact your pharmacy*  Follow-Up: Call back or seek an in-person evaluation if the symptoms worsen or if the condition fails to improve as anticipated.  Other Instructions Acute Bronchitis, Adult Acute bronchitis is sudden or  acute swelling of the air tubes (bronchi) in the lungs. Acute bronchitis causes these tubes to fill with mucus, which can make it hard to breathe. It can also cause coughing or wheezing. In adults, acute bronchitis usually goes away within 2 weeks. A cough caused by bronchitis may last up to 3 weeks. Smoking, allergies, and asthma can make the condition worse. What are the causes? This condition can be caused by germs and by substances that irritate the lungs, including: Cold and flu viruses. The most common cause of this condition is the virus that causes the common cold. Bacteria. Substances that irritate the lungs, including: Smoke from cigarettes and other forms of tobacco. Dust and pollen. Fumes from chemical products, gases, or burned fuel. Other materials that pollute indoor or outdoor air. Close contact with someone who has acute bronchitis. What increases the risk? The following factors may make you more likely to develop this condition: A weak body's defense system, also called the immune system. A condition that affects your lungs and breathing, such as asthma. What are the signs or symptoms? Common symptoms of this condition include: Lung and breathing problems, such as: Coughing. This may bring up clear, yellow, or green mucus from your lungs (sputum). Wheezing. Having too much mucus in your lungs (chest congestion). Having shortness of breath. A fever. Chills. Aches and pains, including: Tightness in your chest and other body aches. A sore throat. How is this diagnosed? This condition is usually diagnosed based on: Your symptoms and medical history. A physical exam. You may also have other tests, including tests to rule out other conditions, such as pneumonia. These tests include: A test of lung function. Test of a mucus sample to look for the presence of bacteria. Tests to check the oxygen level in your blood. Blood tests. Chest X-ray. How is this treated? Most  cases of acute bronchitis clear up over time without treatment. Your health care provider may recommend: Drinking more fluids. This can thin your mucus, which may improve your breathing. Using a device that gets medicine into your lungs (inhaler) to help improve breathing and control coughing. Using a vaporizer or a humidifier. These are machines that add water to the air to help you breathe better. Taking a medicine for a fever. Taking a medicine that thins mucus and clears congestion (expectorant). Taking a medicine that prevents or stops coughing (cough suppressant). Follow these instructions at home: Activity Get plenty of rest. Return to your normal activities as told by your health care provider. Ask your health care provider what activities are safe for you. Lifestyle  Drink enough fluid to keep your urine pale yellow. Do not drink alcohol. Do not use any products  that contain nicotine or tobacco, such as cigarettes, e-cigarettes, and chewing tobacco. If you need help quitting, ask your health care provider. Be aware that: Your bronchitis will get worse if you smoke or breathe in other people's smoke (secondhand smoke). Your lungs will heal faster if you quit smoking. General instructions Take over-the-counter and prescription medicines only as told by your health care provider. Use an inhaler, vaporizer, or humidifier as told by your health care provider. If you have a sore throat, gargle with a salt-water mixture 3-4 times a day or as needed. To make a salt-water mixture, completely dissolve -1 tsp (3-6 g) of salt in 1 cup (237 mL) of warm water. Take two teaspoons of honey at bedtime to lessen coughing at night. Keep all follow-up visits as told by your health care provider. This is important. How is this prevented? To lower your risk of getting this condition again: Wash your hands often with soap and water. If soap and water are not available, use hand sanitizer. Avoid contact  with people who have cold symptoms. Try not to touch your mouth, nose, or eyes with your hands. Avoid places where there are fumes from chemicals. Breathing these fumes will make your condition worse. Get the flu shot every year. Contact a health care provider if: Your symptoms do not improve after 2 weeks of treatment. You vomit more than once or twice. You have symptoms of dehydration such as: Dark urine. Dry skin or eyes. Increased thirst. Headaches. Confusion. Muscle cramps. Get help right away if you: Cough up blood. Feel pain in your chest. Have severe shortness of breath. Faint or keep feeling like you are going to faint. Have a severe headache. Have fever or chills that get worse. These symptoms may represent a serious problem that is an emergency. Do not wait to see if the symptoms will go away. Get medical help right away. Call your local emergency services (911 in the [place] Do not drive yourself to the hospital. Summary Acute bronchitis is sudden (acute) inflammation of the air tubes (bronchi) between the windpipe and the lungs. In adults, acute bronchitis usually goes away within 2 weeks, although coughing may last 3 weeks or longer. Take over-the-counter and prescription medicines only as told by your health care provider. Drink enough fluid to keep your urine pale yellow. Contact a health care provider if your symptoms do not improve after 2 weeks of treatment. Get help right away if you cough up blood, faint, or have chest pain or shortness of breath. This information is not intended to replace advice given to you by your health care provider. Make sure you discuss any questions you have with your health care provider. Document Revised: 01/16/2020 Document Reviewed: 09/08/2018 Elsevier Patient Education  2022 [employer].    If you have been instructed to have an in-person evaluation today at a local Urgent Care facility, please use the link below. It will take you  to a list of all of our available [facility], including address, phone number and hours of operation. Please do not delay care.  [facility]  If you or a family member do not have a primary care provider, use the link below to schedule a visit and establish care. When you choose a [facility] primary care physician or advanced practice provider, you gain a long-term partner in health. Find a Primary Care Provider  Learn more about [facility] in-office and virtual care options:  -  Get Care Now
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
## 2020-12-15 ENCOUNTER — Emergency Department (HOSPITAL_BASED_OUTPATIENT_CLINIC_OR_DEPARTMENT_OTHER): Payer: BC Managed Care – PPO

## 2020-12-15 ENCOUNTER — Other Ambulatory Visit: Payer: Self-pay

## 2020-12-15 ENCOUNTER — Encounter (HOSPITAL_BASED_OUTPATIENT_CLINIC_OR_DEPARTMENT_OTHER): Payer: Self-pay

## 2020-12-15 ENCOUNTER — Emergency Department (HOSPITAL_BASED_OUTPATIENT_CLINIC_OR_DEPARTMENT_OTHER)
Admission: EM | Admit: 2020-12-15 | Discharge: 2020-12-15 | Disposition: A | Discharge status: Home / Self Care | Payer: BC Managed Care – PPO | Attending: Emergency Medicine | Admitting: Emergency Medicine

## 2020-12-15 DIAGNOSIS — Y92410 Unspecified street and highway as the place of occurrence of the external cause: Secondary | ICD-10-CM | POA: Diagnosis not present

## 2020-12-15 DIAGNOSIS — M542 Cervicalgia: Secondary | ICD-10-CM | POA: Diagnosis not present

## 2020-12-15 DIAGNOSIS — M545 Low back pain, unspecified: Secondary | ICD-10-CM | POA: Insufficient documentation

## 2020-12-15 DIAGNOSIS — M479 Spondylosis, unspecified: Secondary | ICD-10-CM | POA: Diagnosis not present

## 2020-12-15 DIAGNOSIS — Z041 Encounter for examination and observation following transport accident: Secondary | ICD-10-CM | POA: Diagnosis not present

## 2020-12-15 DIAGNOSIS — M47813 Spondylosis without myelopathy or radiculopathy, cervicothoracic region: Secondary | ICD-10-CM | POA: Diagnosis not present

## 2020-12-15 DIAGNOSIS — S299XXA Unspecified injury of thorax, initial encounter: Secondary | ICD-10-CM | POA: Diagnosis not present

## 2020-12-15 DIAGNOSIS — S20211A Contusion of right front wall of thorax, initial encounter: Secondary | ICD-10-CM | POA: Diagnosis not present

## 2020-12-15 DIAGNOSIS — M7918 Myalgia, other site: Secondary | ICD-10-CM

## 2020-12-15 LAB — CBC WITH DIFFERENTIAL/PLATELET
Abs Immature Granulocytes: 0.03 10*3/uL (ref 0.00–0.07)
Basophils Absolute: 0 10*3/uL (ref 0.0–0.1)
Basophils Relative: 1 %
Eosinophils Absolute: 0.1 10*3/uL (ref 0.0–0.5)
Eosinophils Relative: 2 %
HCT: 39.9 % (ref 36.0–46.0)
Hemoglobin: 13.7 g/dL (ref 12.0–15.0)
Immature Granulocytes: 0 %
Lymphocytes Relative: 17 %
Lymphs Abs: 1.4 10*3/uL (ref 0.7–4.0)
MCH: 30.9 pg (ref 26.0–34.0)
MCHC: 34.3 g/dL (ref 30.0–36.0)
MCV: 89.9 fL (ref 80.0–100.0)
Monocytes Absolute: 0.6 10*3/uL (ref 0.1–1.0)
Monocytes Relative: 8 %
Neutro Abs: 6.1 10*3/uL (ref 1.7–7.7)
Neutrophils Relative %: 72 %
Platelets: 210 10*3/uL (ref 150–400)
RBC: 4.44 MIL/uL (ref 3.87–5.11)
RDW: 12.3 % (ref 11.5–15.5)
WBC: 8.3 10*3/uL (ref 4.0–10.5)
nRBC: 0 % (ref 0.0–0.2)

## 2020-12-15 LAB — BASIC METABOLIC PANEL
Anion gap: 7 (ref 5–15)
BUN: 20 mg/dL (ref 6–20)
CO2: 23 mmol/L (ref 22–32)
Calcium: 9.4 mg/dL (ref 8.9–10.3)
Chloride: 107 mmol/L (ref 98–111)
Creatinine, Ser: 0.7 mg/dL (ref 0.44–1.00)
GFR, Estimated: >60 mL/min (ref >60)
Glucose, Bld: 96 mg/dL (ref 70–99)
Potassium: 4 mmol/L (ref 3.5–5.1)
Sodium: 137 mmol/L (ref 135–145)

## 2020-12-15 MED ORDER — IBUPROFEN 400 MG PO TABS
400.0000 mg | ORAL_TABLET | Freq: Once | ORAL | Status: AC | PRN
  Administered 2020-12-15: 400 mg via ORAL
  Filled 2020-12-15: qty 1

## 2020-12-15 MED ORDER — METHOCARBAMOL 500 MG PO TABS
500.0000 mg | ORAL_TABLET | Freq: Once | ORAL | Status: AC
  Administered 2020-12-15: 500 mg via ORAL
  Filled 2020-12-15: qty 1

## 2020-12-15 MED ORDER — HYDROCODONE-ACETAMINOPHEN 5-325 MG PO TABS
1.0000 | ORAL_TABLET | Freq: Once | ORAL | Status: AC
  Administered 2020-12-15: 1 via ORAL
  Filled 2020-12-15: qty 1

## 2020-12-15 MED ORDER — METHOCARBAMOL 500 MG PO TABS: 500.0000 mg | ORAL_TABLET | Freq: Two times a day (BID) | ORAL | 0 refills | Status: DC

## 2020-12-15 MED ORDER — IOHEXOL 300 MG/ML  SOLN
75.0000 mL | Freq: Once | INTRAMUSCULAR | Status: AC | PRN
  Administered 2020-12-15: 75 mL via INTRAVENOUS

## 2020-12-15 NOTE — Discharge Instructions (Addendum)
Take Robaxin as needed as prescribed for pain.  If your provider has advised use of Advil or Aleve, may use these as well. Warm compresses to sore muscles followed by gentle stretching.  Recommend recheck with your primary care provider, you may benefit from physical therapy.
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
## 2020-12-15 NOTE — ED Notes (Signed)
Patient transported to CT 

## 2020-12-16 ENCOUNTER — Ambulatory Visit: Payer: BC Managed Care – PPO | Admitting: Family Medicine

## 2020-12-19 ENCOUNTER — Encounter: Payer: Self-pay | Admitting: Rheumatology

## 2020-12-19 DIAGNOSIS — Z79899 Other long term (current) drug therapy: Secondary | ICD-10-CM

## 2020-12-22 DIAGNOSIS — Z79899 Other long term (current) drug therapy: Secondary | ICD-10-CM | POA: Diagnosis not present

## 2020-12-23 ENCOUNTER — Ambulatory Visit (INDEPENDENT_AMBULATORY_CARE_PROVIDER_SITE_OTHER): Payer: BC Managed Care – PPO | Admitting: Rheumatology

## 2020-12-23 ENCOUNTER — Other Ambulatory Visit: Payer: Self-pay

## 2020-12-23 ENCOUNTER — Telehealth: Payer: Self-pay

## 2020-12-23 ENCOUNTER — Encounter: Payer: Self-pay | Admitting: Rheumatology

## 2020-12-23 VITALS — BP 121/80 | HR 81 | Ht 69.0 in | Wt 229.0 lb

## 2020-12-23 DIAGNOSIS — H2013 Chronic iridocyclitis, bilateral: Secondary | ICD-10-CM | POA: Diagnosis not present

## 2020-12-23 DIAGNOSIS — M170 Bilateral primary osteoarthritis of knee: Secondary | ICD-10-CM

## 2020-12-23 DIAGNOSIS — M0579 Rheumatoid arthritis with rheumatoid factor of multiple sites without organ or systems involvement: Secondary | ICD-10-CM | POA: Diagnosis not present

## 2020-12-23 DIAGNOSIS — M545 Low back pain, unspecified: Secondary | ICD-10-CM

## 2020-12-23 DIAGNOSIS — M4802 Spinal stenosis, cervical region: Secondary | ICD-10-CM

## 2020-12-23 DIAGNOSIS — Z8269 Family history of other diseases of the musculoskeletal system and connective tissue: Secondary | ICD-10-CM

## 2020-12-23 DIAGNOSIS — Z79899 Other long term (current) drug therapy: Secondary | ICD-10-CM

## 2020-12-23 DIAGNOSIS — M5136 Other intervertebral disc degeneration, lumbar region: Secondary | ICD-10-CM

## 2020-12-23 DIAGNOSIS — Q667 Congenital pes cavus, unspecified foot: Secondary | ICD-10-CM

## 2020-12-23 DIAGNOSIS — M533 Sacrococcygeal disorders, not elsewhere classified: Secondary | ICD-10-CM

## 2020-12-23 DIAGNOSIS — U071 COVID-19: Secondary | ICD-10-CM

## 2020-12-23 DIAGNOSIS — Z8719 Personal history of other diseases of the digestive system: Secondary | ICD-10-CM

## 2020-12-23 DIAGNOSIS — M51369 Other intervertebral disc degeneration, lumbar region without mention of lumbar back pain or lower extremity pain: Secondary | ICD-10-CM

## 2020-12-23 DIAGNOSIS — G8929 Other chronic pain: Secondary | ICD-10-CM

## 2020-12-23 DIAGNOSIS — Z85820 Personal history of malignant melanoma of skin: Secondary | ICD-10-CM

## 2020-12-23 DIAGNOSIS — M5030 Other cervical disc degeneration, unspecified cervical region: Secondary | ICD-10-CM

## 2020-12-23 LAB — HEPATIC FUNCTION PANEL
AG Ratio: 1.4 (calc) (ref 1.0–2.5)
ALT: 21 U/L (ref 6–29)
AST: 17 U/L (ref 10–35)
Albumin: 3.8 g/dL (ref 3.6–5.1)
Alkaline phosphatase (APISO): 68 U/L (ref 37–153)
Bilirubin, Direct: 0.1 mg/dL (ref 0.0–0.2)
Globulin: 2.7 g/dL (calc) (ref 1.9–3.7)
Indirect Bilirubin: 0.3 mg/dL (calc) (ref 0.2–1.2)
Total Bilirubin: 0.4 mg/dL (ref 0.2–1.2)
Total Protein: 6.5 g/dL (ref 6.1–8.1)

## 2020-12-23 MED ORDER — "TUBERCULIN SYRINGE 27G X 1/2"" 1 ML MISC": 2 refills | Status: DC

## 2020-12-23 NOTE — Telephone Encounter (Signed)
Please apply for rasuvo or otrexup 25mg  per Dr. Estanislado Pandy. Patient is currently on vial/syringe methotrexate.Thanks!

## 2020-12-23 NOTE — Progress Notes (Signed)
CMP is normal.

## 2020-12-23 NOTE — Patient Instructions (Signed)
Standing Labs We placed an order today for your standing lab work.   Please have your standing labs drawn in January and very 3 months  If possible, please have your labs drawn 2 weeks prior to your appointment so that the provider can discuss your results at your appointment.  Please note that you may see your imaging and lab results in [facility] before we have reviewed them. We may be awaiting multiple results to interpret others before contacting you. Please allow our office up to 72 hours to thoroughly review all of the results before contacting the office for clarification of your results.  We have open lab daily: Monday through Thursday from 1:30-4:30 PM and Friday from 1:30-4:00 PM at the office of Dr. Bo Merino, [facility].   Please be advised, all patients with office appointments requiring lab work will take precedent over walk-in lab work.  If possible, please come for your lab work on Monday and Friday afternoons, as you may experience shorter wait times. The office is located at [address] [place], [place], [place] [address] No appointment is necessary.   Labs are drawn by [facility]. Please bring your co-pay at the time of your lab draw.  You may receive a bill from [facility] for your lab work.  If you wish to have your labs drawn at another location, please call the office 24 hours in advance to send orders.  If you have any questions regarding directions or hours of operation,  please call [phone].   As a reminder, please drink plenty of water prior to coming for your lab work. Thanks!   Vaccines You are taking a medication(s) that can suppress your immune system.  The following immunizations are recommended: Flu annually Covid-19  Td/Tdap (tetanus, diphtheria, pertussis) every 10 years Pneumonia (Prevnar 15 then Pneumovax 23 at least 1 year apart.  Alternatively, can take Prevnar 20 without needing additional dose) Shingrix: 2 doses from 4 weeks  to 6 months apart  Please check with your PCP to make sure you are up to date.   If you have signs or symptoms of an infection or start antibiotics: First, call your PCP for workup of your infection. Hold your medication through the infection, until you complete your antibiotics, and until symptoms resolve if you take the following: Injectable medication (Actemra, Benlysta, Cimzia, Cosentyx, Enbrel, Humira, Kevzara, Orencia, Remicade, Simponi, Stelara, Taltz, Tremfya) Methotrexate Leflunomide (Arava) Mycophenolate (Cellcept) Morrie Sheldon, Olumiant, or Rinvoq

## 2020-12-25 NOTE — Telephone Encounter (Signed)
Submitted a Prior Authorization request to [facility] for RASUVO via CoverMyMeds. Will update once we receive a response.   Key: [id number]

## 2020-12-29 ENCOUNTER — Other Ambulatory Visit (HOSPITAL_COMMUNITY): Payer: Self-pay

## 2020-12-29 MED ORDER — OTREXUP 25 MG/0.4ML ~~LOC~~ SOAJ
25.0000 mg | SUBCUTANEOUS | 2 refills | Status: DC
  Filled 2020-12-29: qty 1.6, 28d supply, fill #0
  Filled 2021-02-08 – 2021-02-24 (×2): qty 1.6, 28d supply, fill #1
  Filled 2021-05-25: qty 1.6, 28d supply, fill #2
  Filled 2021-08-24: qty 1.6, 28d supply, fill #3

## 2020-12-29 NOTE — Telephone Encounter (Signed)
Received a fax regarding Prior Authorization from [facility] for [place]. Authorization has been DENIED because patient has not previously failed Otrexup or Reditrex.   Submitted a Prior Authorization request to [facility] for OTREXUP via CoverMyMeds. Will update once we receive a response.   Key: [id number]

## 2020-12-29 NOTE — Telephone Encounter (Signed)
Rx for Otrexup 25mg  once weekly sent to [facility]. Patient advised that she can start taking Otrexup on Saturday, 01/03/21.  Called patient to advise that if she does not hear from pharmacy by 12/31/20, she should reach out to schedule shipment  MyChart message sent to patient with pharmacy info and Otrexup copay card information  Knox Saliva, PharmD, MPH, BCPS Clinical Pharmacist (Rheumatology and Pulmonology)

## 2020-12-29 NOTE — Addendum Note (Signed)
Addended by: Cassandria Anger on: 12/29/2020 11:15 AM   Modules accepted: Orders

## 2020-12-29 NOTE — Telephone Encounter (Addendum)
   Savings card is valid for 13 fills.

## 2020-12-29 NOTE — Telephone Encounter (Signed)
Received notification from [facility] regarding a prior authorization for OTREXUP. Authorization has been APPROVED from 12/29/2020 to 12/28/2021.   Per test claim, copay for 28 days supply is $84.08  Patient can fill through [facility]: [phone]   Authorization # [id number]   Enrolled pt into [place] assistance program: RxBIN: [id number] [place]: CN RxGRP: [id number] ID: [id number]

## 2020-12-30 ENCOUNTER — Other Ambulatory Visit (HOSPITAL_COMMUNITY): Payer: Self-pay

## 2020-12-31 ENCOUNTER — Other Ambulatory Visit (HOSPITAL_COMMUNITY): Payer: Self-pay

## 2021-01-02 ENCOUNTER — Other Ambulatory Visit (HOSPITAL_COMMUNITY): Payer: Self-pay

## 2021-02-09 ENCOUNTER — Other Ambulatory Visit (HOSPITAL_COMMUNITY): Payer: Self-pay

## 2021-02-17 ENCOUNTER — Other Ambulatory Visit (HOSPITAL_COMMUNITY): Payer: Self-pay

## 2021-02-24 ENCOUNTER — Other Ambulatory Visit (HOSPITAL_COMMUNITY): Payer: Self-pay

## 2021-02-27 ENCOUNTER — Other Ambulatory Visit: Payer: Self-pay | Admitting: Rheumatology

## 2021-02-27 DIAGNOSIS — M0579 Rheumatoid arthritis with rheumatoid factor of multiple sites without organ or systems involvement: Secondary | ICD-10-CM

## 2021-02-27 NOTE — Telephone Encounter (Signed)
Next Visit: 05/26/2021  Last Visit: 12/23/2020  Labs: 12/15/2020 and 12/22/2020 WNL  Eye exam: 06/02/2020 WNL    Current Dose per office note 12/23/2020: plaquenil 200 mg 1 tablet by mouth  daily  JF:JKNIOCODIR arthritis involving multiple sites with positive rheumatoid factor   Last Fill: 08/29/2020  Okay to refill Plaquenil?

## 2021-03-09 ENCOUNTER — Other Ambulatory Visit: Payer: Self-pay | Admitting: Family Medicine

## 2021-03-23 ENCOUNTER — Telehealth: Payer: Self-pay | Admitting: *Deleted

## 2021-03-23 NOTE — Telephone Encounter (Signed)
Bo Merino, MD  Carole Binning, LPN Please advise patient to hold methotrexate for 1 week after COVID-19 vaccination.  Bo Merino, MD   Left message to advise patient.

## 2021-04-13 ENCOUNTER — Encounter: Payer: Self-pay | Admitting: Family Medicine

## 2021-04-13 ENCOUNTER — Ambulatory Visit (INDEPENDENT_AMBULATORY_CARE_PROVIDER_SITE_OTHER): Payer: BC Managed Care – PPO | Admitting: Family Medicine

## 2021-04-13 VITALS — BP 121/64 | HR 71 | Temp 97.4°F | Ht 68.0 in | Wt 223.4 lb

## 2021-04-13 DIAGNOSIS — R350 Frequency of micturition: Secondary | ICD-10-CM

## 2021-04-13 DIAGNOSIS — R300 Dysuria: Secondary | ICD-10-CM | POA: Diagnosis not present

## 2021-04-13 DIAGNOSIS — M0579 Rheumatoid arthritis with rheumatoid factor of multiple sites without organ or systems involvement: Secondary | ICD-10-CM | POA: Diagnosis not present

## 2021-04-13 DIAGNOSIS — N3000 Acute cystitis without hematuria: Secondary | ICD-10-CM | POA: Diagnosis not present

## 2021-04-13 LAB — POC URINALSYSI DIPSTICK (AUTOMATED)
Bilirubin, UA: NEGATIVE
Blood, UA: NEGATIVE
Clarity, UA: NEGATIVE
Glucose, UA: NEGATIVE
Ketones, UA: NEGATIVE
Nitrite, UA: NEGATIVE
Protein, UA: NEGATIVE
Spec Grav, UA: 1.01 (ref 1.010–1.025)
Urobilinogen, UA: 0.2 E.U./dL
pH, UA: 6 (ref 5.0–8.0)

## 2021-04-13 MED ORDER — NITROFURANTOIN MONOHYD MACRO 100 MG PO CAPS: 100.0000 mg | ORAL_CAPSULE | Freq: Two times a day (BID) | ORAL | 0 refills | Status: AC

## 2021-04-13 NOTE — Progress Notes (Signed)
Chief Complaint  Patient presents with   Urinary Frequency   Dysuria    Brittany Malone is a 56 y.o. female here for possible UTI.  Duration: 1 week. Symptoms: Dysuria, urinary frequency, urinary retention, and urgency Denies: hematuria, urinary hesitancy, fever, nausea, vomiting, urinary incontinence, and flank pain, vaginal discharge Hx of recurrent UTI? No Denies new sexual partners.  Past Medical History:  Diagnosis Date   Allergy    SEASONAL   Anemia    Arthritis    Back pain    Diverticulitis    Fatty liver    per patient   GERD (gastroesophageal reflux disease)    Insomnia    Joint pain    NAFLD (nonalcoholic fatty liver disease)    Rheumatoid arthritis ([facility])      BP 121/64    Pulse 71    Temp (!) 97.4 F (36.3 C) (Oral)    Ht 5\' 8"  (1.727 m)    Wt 223 lb 6 oz (101.3 kg)    SpO2 99%    BMI 33.96 kg/m  General: Awake, alert, appears stated age Heart: RRR Lungs: CTAB, normal respiratory effort, no accessory muscle usage Abd: BS+, soft, NT, ND, no masses or organomegaly MSK: No CVA tenderness, neg Lloyd's sign Psych: Age appropriate judgment and insight  Acute cystitis without hematuria - Plan: nitrofurantoin, macrocrystal-monohydrate, (MACROBID) 100 MG capsule  Dysuria - Plan: POCT Urinalysis Dipstick (Automated), Urine Culture  Frequent urination - Plan: POCT Urinalysis Dipstick (Automated), Urine Culture  Rheumatoid arthritis involving multiple sites with positive rheumatoid factor ([place]), Chronic  5 d of Macrobid. Stay hydrated. Seek immediate care if pt starts to develop fevers, new/worsening symptoms, uncontrollable N/V. F/u prn. The patient voiced understanding and agreement to the plan.  [place], DO 04/13/21 12:50 PM

## 2021-04-13 NOTE — Patient Instructions (Signed)
Stay hydrated.   Warning signs/symptoms: Uncontrollable nausea/vomiting, fevers, worsening symptoms despite treatment, confusion.  Give us around 2 business days to get culture back to you.  Let us know if you need anything. 

## 2021-04-15 LAB — URINE CULTURE
MICRO NUMBER:: 13000210
SPECIMEN QUALITY:: ADEQUATE

## 2021-05-12 NOTE — Progress Notes (Signed)
? ?Office Visit Note ? ?Patient: Brittany Malone             ?Date of Birth: [birth year]           ?MRN: [id number]             ?PCP: Shelda Pal, DO ?Referring: Shelda Pal* ?Visit Date: 05/26/2021 ?Occupation: '@GUAROCC' @ ? ?Subjective:  ?Medication monitoring  ? ?History of Present Illness: Hillery Vega is a 56 y.o. female with history of seropositive rheumatoid arthritis, uveitis, and osteoarthritis.  She is taking Methotrexate 1 ml sq injections once weekly, folic acid 2 mg po daily, and plaquenil 200 mg 1 tablet by mouth daily.  She has not missed any doses recently.  She is tolerating these medications without any side effects.  She denies any signs or symptoms of a rheumatoid arthritis flare.  She has been having some increased neck and back pain recently and is planning on establishing care with a spine specialist.  She denies any other joint pain or joint swelling at this time.  She denies any difficulty with ADLs.  She denies any uveitis flares.  She denies any new medical conditions.  ? ? ? ?Activities of Daily Living:  ?Patient reports morning stiffness for 15-20 minutes.   ?Patient Reports nocturnal pain.  ?Difficulty dressing/grooming: Denies ?Difficulty climbing stairs: Denies ?Difficulty getting out of chair: Denies ?Difficulty using hands for taps, buttons, cutlery, and/or writing: Denies ? ?Review of Systems  ?Constitutional:  Positive for fatigue.  ?HENT:  Positive for mouth dryness. Negative for mouth sores and nose dryness.   ?Eyes:  Negative for pain, itching and dryness.  ?Respiratory:  Negative for shortness of breath and difficulty breathing.   ?Cardiovascular:  Negative for chest pain and palpitations.  ?Gastrointestinal:  Negative for blood in stool, constipation and diarrhea.  ?Endocrine: Negative for increased urination.  ?Genitourinary:  Negative for difficulty urinating.  ?Musculoskeletal:  Positive for joint pain, joint pain and morning stiffness. Negative for  joint swelling, myalgias, muscle tenderness and myalgias.  ?Skin:  Negative for color change, rash and redness.  ?Allergic/Immunologic: Negative for susceptible to infections.  ?Neurological:  Positive for headaches. Negative for dizziness, numbness, memory loss and weakness.  ?Hematological:  Negative for bruising/bleeding tendency.  ?Psychiatric/Behavioral:  Negative for confusion.   ? ?PMFS History:  ?Patient Active Problem List  ? Diagnosis Date Noted  ? Rheumatoid arthritis ([place])   ? NAFLD (nonalcoholic fatty liver disease)   ? GERD (gastroesophageal reflux disease)   ? Rheumatoid arthritis involving multiple sites with positive rheumatoid factor ([place]) 08/15/2018  ? Family history of rheumatoid arthritis 08/15/2018  ? Chronic uveitis of both eyes 08/15/2018  ? Pes cavus 08/15/2018  ? History of gastroesophageal reflux (GERD) 08/15/2018  ?  ?Past Medical History:  ?Diagnosis Date  ? Allergy   ? SEASONAL  ? Anemia   ? Arthritis   ? Back pain   ? Diverticulitis   ? Fatty liver   ? per patient  ? GERD (gastroesophageal reflux disease)   ? Insomnia   ? Joint pain   ? NAFLD (nonalcoholic fatty liver disease)   ? Rheumatoid arthritis ([place])   ?  ?Family History  ?Problem Relation Age of Onset  ? Lupus Mother   ? Fibromyalgia Mother   ? Hyperlipidemia Mother   ? Hypertension Father   ? High Cholesterol Father   ? Hyperlipidemia Father   ? Colon polyps Father   ? Kidney Stones Brother   ? Healthy Son   ?  Hypertension Daughter   ? Migraines Daughter   ? Colon cancer Neg Hx   ? Esophageal cancer Neg Hx   ? Rectal cancer Neg Hx   ? Stomach cancer Neg Hx   ? ?Past Surgical History:  ?Procedure Laterality Date  ? ABDOMINAL HYSTERECTOMY    ? ANKLE SURGERY    ? COLONOSCOPY  01/18/2020  ? UPPER GI ENDOSCOPY    ? ?Social History  ? ?Social History Narrative  ? Not on file  ? ?Immunization History  ?Administered Date(s) Administered  ? Influenza,inj,Quad PF,6+ Mos 11/16/2018  ? Moderna Sars-Covid-2 Vaccination 03/08/2019,  04/05/2019, 11/23/2019, 06/02/2020, 03/21/2021  ? Pneumococcal Polysaccharide-23 11/16/2018  ? Tdap 05/16/2010, 06/16/2020  ? Zoster Recombinat (Shingrix) 11/16/2018, 01/16/2019  ?  ? ?Objective: ?Vital Signs: BP 117/84 (BP Location: Left Arm, Patient Position: Sitting, Cuff Size: Large)   Pulse 73   Ht 5' 7.75" (1.721 m)   Wt 222 lb 3.2 oz (100.8 kg)   BMI 34.04 kg/m?   ? ?Physical Exam ?Vitals and nursing note reviewed.  ?Constitutional:   ?   Appearance: She is well-developed.  ?HENT:  ?   Head: Normocephalic and atraumatic.  ?Eyes:  ?   Conjunctiva/sclera: Conjunctivae normal.  ?Cardiovascular:  ?   Rate and Rhythm: Normal rate and regular rhythm.  ?   Heart sounds: Normal heart sounds.  ?Pulmonary:  ?   Effort: Pulmonary effort is normal.  ?   Breath sounds: Normal breath sounds.  ?Abdominal:  ?   General: Bowel sounds are normal.  ?   Palpations: Abdomen is soft.  ?Musculoskeletal:  ?   Cervical back: Normal range of motion.  ?Skin: ?   General: Skin is warm and dry.  ?   Capillary Refill: Capillary refill takes less than 2 seconds.  ?Neurological:  ?   Mental Status: She is alert and oriented to person, place, and time.  ?Psychiatric:     ?   Behavior: Behavior normal.  ?  ? ?Musculoskeletal Exam: C-spine has good range of motion with some discomfort and stiffness bilaterally.  No midline spinal tenderness.  Shoulder joints, elbow joints, wrist joints, MCPs, PIPs, DIPs have good range of motion with no synovitis.  PIP and DIP thickening consistent with osteoarthritis of both hands. She was able to make a complete fist bilaterally.  Hip joints have good range of motion with no groin pain.  Knee joints have good range of motion with no warmth or effusion.  Ankle joints have good range of motion with no tenderness or joint swelling.  No tenderness over MTP joints.  Some pedal edema noted bilaterally. ? ?CDAI Exam: ?CDAI Score: 0.2  ?Patient Global: 1 mm; Provider Global: 1 mm ?Swollen: 0 ; Tender: 0  ?Joint  Exam 05/26/2021  ? ?No joint exam has been documented for this visit  ? ?There is currently no information documented on the homunculus. Go to the Rheumatology activity and complete the homunculus joint exam. ? ?Investigation: ?No additional findings. ? ?Imaging: ?No results found. ? ?Recent Labs: ?Lab Results  ?Component Value Date  ? WBC 4.6 05/22/2021  ? HGB 14.0 05/22/2021  ? PLT 209 05/22/2021  ? NA 141 05/22/2021  ? K 4.1 05/22/2021  ? CL 106 05/22/2021  ? CO2 28 05/22/2021  ? GLUCOSE 85 05/22/2021  ? BUN 21 05/22/2021  ? CREATININE 0.61 05/22/2021  ? BILITOT 0.5 05/22/2021  ? ALKPHOS 53 06/16/2020  ? AST 19 05/22/2021  ? ALT 23 05/22/2021  ? PROT 6.4  05/22/2021  ? ALBUMIN 3.8 06/16/2020  ? CALCIUM 9.0 05/22/2021  ? GFRAA 114 08/26/2020  ? QFTBGOLDPLUS NEGATIVE 11/20/2019  ? ? ?Speciality Comments: PLQ Eye Exam: 06/02/2020 WNL with Pre-existing Toxo Scars @ [facility] Follow up  in 1 year. ? ?Procedures:  ?No procedures performed ?Allergies: Penicillins  ? ? ?Assessment / Plan:     ?Visit Diagnoses: Rheumatoid arthritis involving multiple sites with positive rheumatoid factor (HCC) - Polyarthralgia, positive RF, positive anti-CCP: She has no joint tenderness or synovitis on examination today.  She has not had any signs or symptoms of a rheumatoid arthritis flare.  She is clinically doing well on Otrexup 25 mg sq injections once weekly, folic acid 2 mg daily, Plaquenil 200 mg 1 tablet by mouth daily.  She is tolerating these medications without any side effects and has not missed any doses recently.  She has not had any difficulty with ADLs.  Her morning stiffness has been lasting for 15 to 20 minutes daily.  ESR was within normal limits: 6 on 05/22/2021.  She will remain on the current treatment regimen.  She was advised to notify us if she develops signs or symptoms of a flare.  She will follow-up in the office in 5 months. ? ?High risk medication use - Otrexup 25 mg sq injections once weekly,  folic acid 2 mg po daily, and plaquenil 200 mg 1 tablet by mouth  daily.  ?CBC and CMP updated on 05/22/21.  Results were reviewed with the patient today in the office.  Her next lab work will be due in June and ev

## 2021-05-21 ENCOUNTER — Other Ambulatory Visit: Payer: Self-pay | Admitting: *Deleted

## 2021-05-21 DIAGNOSIS — M0579 Rheumatoid arthritis with rheumatoid factor of multiple sites without organ or systems involvement: Secondary | ICD-10-CM

## 2021-05-21 DIAGNOSIS — Z79899 Other long term (current) drug therapy: Secondary | ICD-10-CM

## 2021-05-22 DIAGNOSIS — Z79899 Other long term (current) drug therapy: Secondary | ICD-10-CM | POA: Diagnosis not present

## 2021-05-22 DIAGNOSIS — M0579 Rheumatoid arthritis with rheumatoid factor of multiple sites without organ or systems involvement: Secondary | ICD-10-CM | POA: Diagnosis not present

## 2021-05-23 LAB — CBC WITH DIFFERENTIAL/PLATELET
Absolute Monocytes: 511 cells/uL (ref 200–950)
Basophils Absolute: 9 cells/uL (ref 0–200)
Basophils Relative: 0.2 %
Eosinophils Absolute: 110 cells/uL (ref 15–500)
Eosinophils Relative: 2.4 %
HCT: 41.4 % (ref 35.0–45.0)
Hemoglobin: 14 g/dL (ref 11.7–15.5)
Lymphs Abs: 934 cells/uL (ref 850–3900)
MCH: 30.5 pg (ref 27.0–33.0)
MCHC: 33.8 g/dL (ref 32.0–36.0)
MCV: 90.2 fL (ref 80.0–100.0)
MPV: 9.7 fL (ref 7.5–12.5)
Monocytes Relative: 11.1 %
Neutro Abs: 3036 cells/uL (ref 1500–7800)
Neutrophils Relative %: 66 %
Platelets: 209 10*3/uL (ref 140–400)
RBC: 4.59 10*6/uL (ref 3.80–5.10)
RDW: 13 % (ref 11.0–15.0)
Total Lymphocyte: 20.3 %
WBC: 4.6 10*3/uL (ref 3.8–10.8)

## 2021-05-23 LAB — COMPLETE METABOLIC PANEL WITH GFR
AG Ratio: 1.7 (calc) (ref 1.0–2.5)
ALT: 23 U/L (ref 6–29)
AST: 19 U/L (ref 10–35)
Albumin: 4 g/dL (ref 3.6–5.1)
Alkaline phosphatase (APISO): 83 U/L (ref 37–153)
BUN: 21 mg/dL (ref 7–25)
CO2: 28 mmol/L (ref 20–32)
Calcium: 9 mg/dL (ref 8.6–10.4)
Chloride: 106 mmol/L (ref 98–110)
Creat: 0.61 mg/dL (ref 0.50–1.03)
Globulin: 2.4 g/dL (calc) (ref 1.9–3.7)
Glucose, Bld: 85 mg/dL (ref 65–99)
Potassium: 4.1 mmol/L (ref 3.5–5.3)
Sodium: 141 mmol/L (ref 135–146)
Total Bilirubin: 0.5 mg/dL (ref 0.2–1.2)
Total Protein: 6.4 g/dL (ref 6.1–8.1)
eGFR: 104 mL/min/{1.73_m2} (ref >=60)

## 2021-05-23 LAB — SEDIMENTATION RATE: Sed Rate: 6 mm/h (ref 0–30)

## 2021-05-24 NOTE — Progress Notes (Signed)
CBC, CMP and sed rate are normal.

## 2021-05-25 ENCOUNTER — Other Ambulatory Visit (HOSPITAL_COMMUNITY): Payer: Self-pay

## 2021-05-26 ENCOUNTER — Ambulatory Visit (INDEPENDENT_AMBULATORY_CARE_PROVIDER_SITE_OTHER): Payer: BC Managed Care – PPO | Admitting: Physician Assistant

## 2021-05-26 ENCOUNTER — Other Ambulatory Visit: Payer: Self-pay

## 2021-05-26 ENCOUNTER — Other Ambulatory Visit (HOSPITAL_COMMUNITY): Payer: Self-pay

## 2021-05-26 ENCOUNTER — Encounter: Payer: Self-pay | Admitting: Physician Assistant

## 2021-05-26 VITALS — BP 117/84 | HR 73 | Ht 67.75 in | Wt 222.2 lb

## 2021-05-26 DIAGNOSIS — Z79899 Other long term (current) drug therapy: Secondary | ICD-10-CM

## 2021-05-26 DIAGNOSIS — M170 Bilateral primary osteoarthritis of knee: Secondary | ICD-10-CM

## 2021-05-26 DIAGNOSIS — U071 COVID-19: Secondary | ICD-10-CM

## 2021-05-26 DIAGNOSIS — M5030 Other cervical disc degeneration, unspecified cervical region: Secondary | ICD-10-CM

## 2021-05-26 DIAGNOSIS — M533 Sacrococcygeal disorders, not elsewhere classified: Secondary | ICD-10-CM

## 2021-05-26 DIAGNOSIS — H2013 Chronic iridocyclitis, bilateral: Secondary | ICD-10-CM

## 2021-05-26 DIAGNOSIS — M51369 Other intervertebral disc degeneration, lumbar region without mention of lumbar back pain or lower extremity pain: Secondary | ICD-10-CM

## 2021-05-26 DIAGNOSIS — M0579 Rheumatoid arthritis with rheumatoid factor of multiple sites without organ or systems involvement: Secondary | ICD-10-CM | POA: Diagnosis not present

## 2021-05-26 DIAGNOSIS — G8929 Other chronic pain: Secondary | ICD-10-CM

## 2021-05-26 DIAGNOSIS — Z8269 Family history of other diseases of the musculoskeletal system and connective tissue: Secondary | ICD-10-CM

## 2021-05-26 DIAGNOSIS — Z8719 Personal history of other diseases of the digestive system: Secondary | ICD-10-CM

## 2021-05-26 DIAGNOSIS — Z85820 Personal history of malignant melanoma of skin: Secondary | ICD-10-CM

## 2021-05-26 DIAGNOSIS — Q667 Congenital pes cavus, unspecified foot: Secondary | ICD-10-CM

## 2021-05-26 DIAGNOSIS — M5136 Other intervertebral disc degeneration, lumbar region: Secondary | ICD-10-CM

## 2021-05-26 NOTE — Patient Instructions (Signed)
Standing Labs ?We placed an order today for your standing lab work.  ? ?Please have your standing labs drawn in June and every 3 months  ? ?If possible, please have your labs drawn 2 weeks prior to your appointment so that the provider can discuss your results at your appointment. ? ?Please note that you may see your imaging and lab results in MyChart before we have reviewed them. ?We may be awaiting multiple results to interpret others before contacting you. ?Please allow our office up to 72 hours to thoroughly review all of the results before contacting the office for clarification of your results. ? ?We have open lab daily: ?Monday through Thursday from 1:30-4:30 PM and Friday from 1:30-4:00 PM ?at the office of Dr. Shaili Deveshwar, [facility].   ?Please be advised, all patients with office appointments requiring lab work will take precedent over walk-in lab work.  ?If possible, please come for your lab work on Monday and Friday afternoons, as you may experience shorter wait times. ?The office is located at [address], [address], [place], [place] [address] ?No appointment is necessary.   ?Labs are drawn by [facility]. Please bring your co-pay at the time of your lab draw.  You may receive a bill from [facility] for your lab work. ? ?Please note if you are on Hydroxychloroquine and and an order has been placed for a Hydroxychloroquine level, you will need to have it drawn 4 hours or more after your last dose. ? ?If you wish to have your labs drawn at another location, please call the office 24 hours in advance to send orders. ? ?If you have any questions regarding directions or hours of operation,  ?please call [phone].   ?As a reminder, please drink plenty of water prior to coming for your lab work. Thanks! ? ?

## 2021-06-01 DIAGNOSIS — B5801 Toxoplasma chorioretinitis: Secondary | ICD-10-CM | POA: Diagnosis not present

## 2021-06-01 DIAGNOSIS — Z79899 Other long term (current) drug therapy: Secondary | ICD-10-CM | POA: Diagnosis not present

## 2021-06-01 DIAGNOSIS — M069 Rheumatoid arthritis, unspecified: Secondary | ICD-10-CM | POA: Diagnosis not present

## 2021-06-01 DIAGNOSIS — H31013 Macula scars of posterior pole (postinflammatory) (post-traumatic), bilateral: Secondary | ICD-10-CM | POA: Diagnosis not present

## 2021-06-08 ENCOUNTER — Other Ambulatory Visit: Payer: Self-pay | Admitting: Physician Assistant

## 2021-06-08 DIAGNOSIS — M0579 Rheumatoid arthritis with rheumatoid factor of multiple sites without organ or systems involvement: Secondary | ICD-10-CM

## 2021-06-08 NOTE — Telephone Encounter (Signed)
Next Visit: 10/28/2021 ? ?Last Visit: 05/26/2021 ? ?Labs: 05/22/2021 CBC, CMP and sed rate are normal. ? ?Eye exam: 06/02/2020  ? ?Current Dose per office note on 05/26/2021: plaquenil 200 mg 1 tablet by mouth  daily.  ? ?[id number] arthritis involving multiple sites with positive rheumatoid factor ? ?Last Fill: 02/27/2021 ? ?Okay to refill Plaquenil?  ?

## 2021-06-24 ENCOUNTER — Encounter: Payer: Self-pay | Admitting: Family Medicine

## 2021-06-24 ENCOUNTER — Telehealth (INDEPENDENT_AMBULATORY_CARE_PROVIDER_SITE_OTHER): Payer: BC Managed Care – PPO | Admitting: Family Medicine

## 2021-06-24 DIAGNOSIS — H6983 Other specified disorders of Eustachian tube, bilateral: Secondary | ICD-10-CM

## 2021-06-24 DIAGNOSIS — U071 COVID-19: Secondary | ICD-10-CM | POA: Diagnosis not present

## 2021-06-24 MED ORDER — PREDNISONE 20 MG PO TABS: 40.0000 mg | ORAL_TABLET | Freq: Every day | ORAL | 0 refills | Status: AC

## 2021-06-24 MED ORDER — MOLNUPIRAVIR EUA 200MG CAPSULE
4.0000 | ORAL_CAPSULE | Freq: Two times a day (BID) | ORAL | 0 refills | Status: AC
Start: 2021-06-24 — End: 2021-06-29

## 2021-06-24 NOTE — Progress Notes (Signed)
Chief Complaint  ?Patient presents with  ? Covid Positive  ?  Managing symptoms  ? ? ?Jeanne Ivan here for URI complaints. Due to COVID-19 pandemic, we are interacting via web portal for an electronic face-to-face visit. I verified patient's ID using 2 identifiers. Patient agreed to proceed with visit via this method. Patient is at home, I am at office. Patient, husband and I are present for visit.  ? ?Duration: 2 days  ?Associated symptoms: sinus congestion, sinus pain, ear fullness, sore throat, and coughing ?Denies: rhinorrhea, itchy watery eyes, ear pain, ear drainage, sore throat, wheezing, shortness of breath, myalgia, and fevers, N/v/D, loss of taste/smell ?Treatment to date: Sudafed, ibuprofen ?Sick contacts: Yes; went to [place] w 24 church members, others were getting sick at end of trip ?Tested + for covid today.  ? ?Past Medical History:  ?Diagnosis Date  ? Allergy   ? SEASONAL  ? Anemia   ? Arthritis   ? Back pain   ? Diverticulitis   ? Fatty liver   ? per patient  ? GERD (gastroesophageal reflux disease)   ? Insomnia   ? Joint pain   ? NAFLD (nonalcoholic fatty liver disease)   ? Rheumatoid arthritis ([place])   ? ? ?Objective ?No conversational dyspnea ?Age appropriate judgment and insight ?Nml affect and mood ? ?COVID-19 - Plan: molnupiravir EUA (LAGEVRIO) 200 mg CAPS capsule ? ?Dysfunction of both eustachian tubes - Plan: predniSONE (DELTASONE) 20 MG tablet ? ?Given hx of RA, will tx w molnupiravir. 5 d pred burst 40 mg/d for sinus pain/ear pressure. Continue to push fluids, practice good hand hygiene, cover mouth when coughing. Discussed CDC quarantining guidelines.  ?F/u prn. If starting to experience irreplaceable fluid loss, shaking, or shortness of breath, seek immediate care. ?Pt voiced understanding and agreement to the plan. ? ?Shelda Pal, DO ?06/24/21 ?2:02 PM ? ?

## 2021-06-25 ENCOUNTER — Encounter: Payer: Self-pay | Admitting: Family Medicine

## 2021-06-25 NOTE — Telephone Encounter (Signed)
Left vm to return call.    

## 2021-06-30 ENCOUNTER — Encounter: Payer: BC Managed Care – PPO | Admitting: Family Medicine

## 2021-08-04 DIAGNOSIS — K529 Noninfective gastroenteritis and colitis, unspecified: Secondary | ICD-10-CM | POA: Diagnosis not present

## 2021-08-04 DIAGNOSIS — R1030 Lower abdominal pain, unspecified: Secondary | ICD-10-CM | POA: Diagnosis not present

## 2021-08-24 ENCOUNTER — Other Ambulatory Visit (HOSPITAL_COMMUNITY): Payer: Self-pay

## 2021-08-25 ENCOUNTER — Other Ambulatory Visit (HOSPITAL_COMMUNITY): Payer: Self-pay

## 2021-08-28 ENCOUNTER — Other Ambulatory Visit (HOSPITAL_COMMUNITY): Payer: Self-pay

## 2021-09-07 ENCOUNTER — Other Ambulatory Visit: Payer: Self-pay | Admitting: Physician Assistant

## 2021-09-07 DIAGNOSIS — M0579 Rheumatoid arthritis with rheumatoid factor of multiple sites without organ or systems involvement: Secondary | ICD-10-CM

## 2021-09-07 NOTE — Telephone Encounter (Signed)
Next Visit: 10/28/2021   Last Visit: 05/26/2021   Labs: 05/22/2021 CBC, CMP and sed rate are normal.   Eye exam: 06/02/2020    Current Dose per office note on 05/26/2021: plaquenil 200 mg 1 tablet by mouth  daily.    [id number] arthritis involving multiple sites with positive rheumatoid factor   Last Fill: 06/08/2021  Left message to advise patient she is due to update her PLQ eye exam.    Okay to refill Plaquenil?

## 2021-10-06 ENCOUNTER — Ambulatory Visit
Admission: EM | Admit: 2021-10-06 | Discharge: 2021-10-06 | Disposition: A | Discharge status: Home / Self Care | Payer: BC Managed Care – PPO | Attending: Family Medicine | Admitting: Family Medicine

## 2021-10-06 ENCOUNTER — Ambulatory Visit (INDEPENDENT_AMBULATORY_CARE_PROVIDER_SITE_OTHER): Payer: BC Managed Care – PPO

## 2021-10-06 DIAGNOSIS — J0100 Acute maxillary sinusitis, unspecified: Secondary | ICD-10-CM

## 2021-10-06 DIAGNOSIS — J309 Allergic rhinitis, unspecified: Secondary | ICD-10-CM | POA: Diagnosis not present

## 2021-10-06 DIAGNOSIS — R059 Cough, unspecified: Secondary | ICD-10-CM

## 2021-10-06 MED ORDER — DOXYCYCLINE HYCLATE 100 MG PO CAPS: 100.0000 mg | ORAL_CAPSULE | Freq: Two times a day (BID) | ORAL | 0 refills | Status: AC

## 2021-10-06 MED ORDER — BENZONATATE 200 MG PO CAPS: 200.0000 mg | ORAL_CAPSULE | Freq: Three times a day (TID) | ORAL | 0 refills | Status: AC | PRN

## 2021-10-06 MED ORDER — FEXOFENADINE HCL 180 MG PO TABS: 180.0000 mg | ORAL_TABLET | Freq: Every day | ORAL | 0 refills | Status: DC

## 2021-10-06 MED ORDER — PREDNISONE 20 MG PO TABS: ORAL_TABLET | ORAL | 0 refills | Status: DC

## 2021-10-06 NOTE — Discharge Instructions (Addendum)
Advised patient to take medication as directed with food to completion.  Instructed patient to take Prednisone and Allegra with first dose of Doxycycline for the next 5 of 7 days.  Advised may use Allegra afterwards as needed for concurrent postnasal drainage/drip.  Advised may use Tessalon Perles daily or as needed for cough.  Encouraged patient to increase daily water intake while taking these medications.  Advised patient if symptoms worsen and/or unresolved please follow-up with PCP or here for further evaluation.

## 2021-10-06 NOTE — ED Triage Notes (Signed)
Pt c/o cough and congestion since thurs morning. Sudafed, mucinex and ibuprofen and albuterol prn. Hx of sinus infections and bronchitis. COVID neg at home Fri and today.

## 2021-10-06 NOTE — ED Provider Notes (Signed)
[name]    CSN: [id number] Arrival date & time: 10/06/21  1821      History   Chief Complaint Chief Complaint  Patient presents with   Nasal Congestion   Cough    HPI Brittany Malone is a 56 y.o. female.   HPI 56 year old female presents with cough and congestion for 6 days.  Patient reports history of sinus infections and bronchitis.  Reports negative home COVID-19 teen test on Friday.  PMH significant for NAFLD, rheumatoid arthritis, and GERD.  Past Medical History:  Diagnosis Date   Allergy    SEASONAL   Anemia    Arthritis    Back pain    Diverticulitis    Fatty liver    per patient   GERD (gastroesophageal reflux disease)    Insomnia    Joint pain    NAFLD (nonalcoholic fatty liver disease)    Rheumatoid arthritis ([place])     Patient Active Problem List   Diagnosis Date Noted   Rheumatoid arthritis ([place])    NAFLD (nonalcoholic fatty liver disease)    GERD (gastroesophageal reflux disease)    Rheumatoid arthritis involving multiple sites with positive rheumatoid factor ([place]) 08/15/2018   Family history of rheumatoid arthritis 08/15/2018   Chronic uveitis of both eyes 08/15/2018   Pes cavus 08/15/2018   History of gastroesophageal reflux (GERD) 08/15/2018    Past Surgical History:  Procedure Laterality Date   ABDOMINAL HYSTERECTOMY     ANKLE SURGERY     COLONOSCOPY  01/18/2020   UPPER GI ENDOSCOPY      OB History     Gravida  3   Para  3   Term  3   Preterm      AB      Living         SAB      IAB      Ectopic      Multiple      Live Births               Home Medications    Prior to Admission medications   Medication Sig Start Date End Date Taking? Authorizing Provider  benzonatate (TESSALON) 200 MG capsule Take 1 capsule (200 mg total) by mouth 3 (three) times daily as needed for up to 7 days. 10/06/21 10/13/21 Yes Eliezer Lofts, FNP  doxycycline (VIBRAMYCIN) 100 MG capsule Take 1 capsule (100 mg total) by  mouth 2 (two) times daily for 7 days. 10/06/21 10/13/21 Yes Eliezer Lofts, FNP  fexofenadine [facility] ALLERGY) 180 MG tablet Take 1 tablet (180 mg total) by mouth daily for 15 days. 10/06/21 10/21/21 Yes Eliezer Lofts, FNP  hydroxychloroquine (PLAQUENIL) 200 MG tablet TAKE 1 TABLET BY MOUTH EVERY DAY 09/07/21   Ofilia Neas, PA-C  predniSONE (DELTASONE) 20 MG tablet Take 3 tabs PO daily x 5 days. 10/06/21  Yes Eliezer Lofts, FNP  acetaminophen (TYLENOL) 325 MG tablet Take 650 mg by mouth every 6 (six) hours as needed.    [provider]  albuterol (VENTOLIN HFA) 108 (90 Base) MCG/ACT inhaler Inhale 2 puffs into the lungs every 6 (six) hours as needed for wheezing or shortness of breath. 11/17/20   Mar Daring, PA-C  amitriptyline (ELAVIL) 50 MG tablet TAKE 1 TABLET BY MOUTH AT BEDTIME AS NEEDED FOR SLEEP. 03/09/21   Shelda Pal, DO  Cholecalciferol (VITAMIN D) 50 MCG (2000 UT) CAPS Take 2,000 Units by mouth daily.    [provider]  ferrous sulfate 325 (65 FE) MG tablet Take by mouth.    [provider]  fluticasone (FLONASE) 50 MCG/ACT nasal spray Place into both nostrils as needed for allergies or rhinitis.    [provider]  folic acid (FOLVITE) 1 MG tablet TAKE 2 TABLETS BY MOUTH EVERY DAY 09/10/20   Bo Merino, MD  MELATONIN PO Take by mouth as needed.    [provider]  methocarbamol (ROBAXIN) 500 MG tablet Take 1 tablet (500 mg total) by mouth 2 (two) times daily. 12/15/20   Suzy Bouchard, PA-C  Methotrexate, PF, (OTREXUP) 25 MG/0.4ML SOAJ Inject 25 mg into the skin once a week. 12/29/20   Bo Merino, MD  pantoprazole (PROTONIX) 40 MG tablet Take 1 tablet (40 mg total) by mouth 2 (two) times daily. Patient taking differently: Take 40 mg by mouth daily. 08/21/19   Cirigliano, Vito V, DO  polyethylene glycol (MIRALAX / GLYCOLAX) 17 g packet Take 17 g by mouth daily as needed. 09/30/20   Opalski, Neoma Laming, DO   TUBERCULIN SYR 1CC/27GX1/2" 27G X 1/2" 1 ML MISC Use 1 syringe once weekly to inject methotrexate. 12/23/20   Bo Merino, MD  vitamin B-12 (CYANOCOBALAMIN) 500 MCG tablet Take 1 tablet (500 mcg total) by mouth daily. 08/26/20   Mellody Dance, DO    Family History Family History  Problem Relation Age of Onset   Lupus Mother    Fibromyalgia Mother    Hyperlipidemia Mother    Hypertension Father    High Cholesterol Father    Hyperlipidemia Father    Colon polyps Father    Kidney Stones Brother    Healthy Son    Hypertension Daughter    Migraines Daughter    Colon cancer Neg Hx    Esophageal cancer Neg Hx    Rectal cancer Neg Hx    Stomach cancer Neg Hx     Social History Social History   Tobacco Use   Smoking status: Never    Passive exposure: Past   Smokeless tobacco: Never  Vaping Use   Vaping Use: Never used  Substance Use Topics   Alcohol use: Yes    Comment: rarely    Drug use: Never     Allergies   Penicillins   Review of Systems Review of Systems  HENT:  Positive for congestion.   Respiratory:  Positive for cough.   All other systems reviewed and are negative.    Physical Exam Triage Vital Signs ED Triage Vitals  Enc Vitals Group     BP 10/06/21 1827 113/81     Pulse Rate 10/06/21 1827 86     Resp 10/06/21 1827 18     Temp 10/06/21 1827 98.3 F (36.8 C)     Temp Source 10/06/21 1827 Oral     SpO2 10/06/21 1827 99 %     Weight --      Height --      Head Circumference --      Peak Flow --      Pain Score 10/06/21 1829 0     Pain Loc --      Pain Edu? --      Excl. in [place]? --    No data found.  Updated Vital Signs BP 113/81 (BP Location: Right Arm)   Pulse 86   Temp 98.3 F (36.8 C) (Oral)   Resp 18   SpO2 99%      Physical Exam Vitals and nursing note reviewed.  Constitutional:  General: She is not in acute distress.    Appearance: Normal appearance. She is obese. She is not ill-appearing.  HENT:     Head:  Normocephalic and atraumatic.     Right Ear: Tympanic membrane, ear canal and external ear normal.     Left Ear: Tympanic membrane, ear canal and external ear normal.     Mouth/Throat:     Mouth: Mucous membranes are moist.     Pharynx: Oropharynx is clear.  Eyes:     Extraocular Movements: Extraocular movements intact.     Conjunctiva/sclera: Conjunctivae normal.     Pupils: Pupils are equal, round, and reactive to light.  Cardiovascular:     Rate and Rhythm: Normal rate and regular rhythm.     Pulses: Normal pulses.     Heart sounds: Normal heart sounds. No murmur heard. Pulmonary:     Effort: Pulmonary effort is normal.     Breath sounds: Normal breath sounds. No wheezing, rhonchi or rales.  Musculoskeletal:        General: Normal range of motion.     Cervical back: Normal range of motion and neck supple.  Skin:    General: Skin is warm and dry.  Neurological:     General: No focal deficit present.     Mental Status: She is alert and oriented to person, place, and time. Mental status is at baseline.      UC Treatments / Results  Labs (all labs ordered are listed, but only abnormal results are displayed) Labs Reviewed - No data to display  EKG   Radiology DG Chest 2 View  Result Date: 10/06/2021 CLINICAL DATA:  Productive cough for 1 week EXAM: CHEST - 2 VIEW COMPARISON:  12/15/2020 FINDINGS: Frontal and lateral views of the chest demonstrate an unremarkable cardiac silhouette. No airspace disease, effusion, or pneumothorax. No acute bony abnormalities. IMPRESSION: 1. No acute intrathoracic process. Electronically Signed   By: Randa Ngo M.D.   On: 10/06/2021 19:09    Procedures Procedures (including critical care time)  Medications Ordered in UC Medications - No data to display  Initial Impression / Assessment and Plan / UC Course  I have reviewed the triage vital signs and the nursing notes.  Pertinent labs & imaging results that were available during my care  of the patient were reviewed by me and considered in my medical decision making (see chart for details).     MDM: 1.  Acute maxillary sinusitis, recurrence not specified-Rx'd doxycycline; 2.  Cough-Rx'd prednisone, Tessalon Perles; 3.  Allergic rhinitis-Rx'd Allegra. Advised patient to take medication as directed with food to completion.  Instructed patient to take Prednisone and Allegra with first dose of Doxycycline for the next 5 of 7 days.  Advised may use Allegra afterwards as needed for concurrent postnasal drainage/drip.  Advised may use Tessalon Perles daily or as needed for cough.  Encouraged patient to increase daily water intake while taking these medications.  Advised patient if symptoms worsen and/or unresolved please follow-up with PCP or here for further evaluation.  Work note provided prior to discharge.  Patient discharged home, hemodynamically stable. Final Clinical Impressions(s) / UC Diagnoses   Final diagnoses:  Acute maxillary sinusitis, recurrence not specified  Cough, unspecified type  Allergic rhinitis, unspecified seasonality, unspecified trigger     Discharge Instructions      Advised patient to take medication as directed with food to completion.  Instructed patient to take Prednisone and Allegra with first dose of Doxycycline for the  next 5 of 7 days.  Advised may use Allegra afterwards as needed for concurrent postnasal drainage/drip.  Advised may use Tessalon Perles daily or as needed for cough.  Encouraged patient to increase daily water intake while taking these medications.  Advised patient if symptoms worsen and/or unresolved please follow-up with PCP or here for further evaluation.     ED Prescriptions     Medication Sig Dispense Auth. Provider   doxycycline (VIBRAMYCIN) 100 MG capsule Take 1 capsule (100 mg total) by mouth 2 (two) times daily for 7 days. 14 capsule Eliezer Lofts, FNP   predniSONE (DELTASONE) 20 MG tablet Take 3 tabs PO daily x 5 days.  15 tablet Eliezer Lofts, FNP   fexofenadine [facility] ALLERGY) 180 MG tablet Take 1 tablet (180 mg total) by mouth daily for 15 days. 15 tablet Eliezer Lofts, FNP   benzonatate (TESSALON) 200 MG capsule Take 1 capsule (200 mg total) by mouth 3 (three) times daily as needed for up to 7 days. 40 capsule Eliezer Lofts, FNP      PDMP not reviewed this encounter.   Eliezer Lofts, [place] 10/06/21 1941

## 2021-10-07 ENCOUNTER — Encounter (INDEPENDENT_AMBULATORY_CARE_PROVIDER_SITE_OTHER): Payer: Self-pay

## 2021-10-14 NOTE — Progress Notes (Deleted)
Office Visit Note  Patient: Brittany Malone             Date of Birth: 1965/07/08           MRN: [id number]             PCP: Shelda Pal, DO Referring: Shelda Pal* Visit Date: 10/28/2021 Occupation: '@GUAROCC'$ @  Subjective:  No chief complaint on file.   History of Present Illness: Brittany Malone is a 56 y.o. female ***   Activities of Daily Living:  Patient reports morning stiffness for *** {minute/hour:19697}.   Patient {ACTIONS;DENIES/REPORTS:21021675::"Denies"} nocturnal pain.  Difficulty dressing/grooming: {ACTIONS;DENIES/REPORTS:21021675::"Denies"} Difficulty climbing stairs: {ACTIONS;DENIES/REPORTS:21021675::"Denies"} Difficulty getting out of chair: {ACTIONS;DENIES/REPORTS:21021675::"Denies"} Difficulty using hands for taps, buttons, cutlery, and/or writing: {ACTIONS;DENIES/REPORTS:21021675::"Denies"}  No Rheumatology ROS completed.   PMFS History:  Patient Active Problem List   Diagnosis Date Noted  . Rheumatoid arthritis ([place])   . NAFLD (nonalcoholic fatty liver disease)   . GERD (gastroesophageal reflux disease)   . Rheumatoid arthritis involving multiple sites with positive rheumatoid factor ([place]) 08/15/2018  . Family history of rheumatoid arthritis 08/15/2018  . Chronic uveitis of both eyes 08/15/2018  . Pes cavus 08/15/2018  . History of gastroesophageal reflux (GERD) 08/15/2018    Past Medical History:  Diagnosis Date  . Allergy    SEASONAL  . Anemia   . Arthritis   . Back pain   . Diverticulitis   . Fatty liver    per patient  . GERD (gastroesophageal reflux disease)   . Insomnia   . Joint pain   . NAFLD (nonalcoholic fatty liver disease)   . Rheumatoid arthritis ([place])     Family History  Problem Relation Age of Onset  . Lupus Mother   . Fibromyalgia Mother   . Hyperlipidemia Mother   . Hypertension Father   . High Cholesterol Father   . Hyperlipidemia Father   . Colon polyps Father   . Kidney Stones Brother   .  Healthy Son   . Hypertension Daughter   . Migraines Daughter   . Colon cancer Neg Hx   . Esophageal cancer Neg Hx   . Rectal cancer Neg Hx   . Stomach cancer Neg Hx    Past Surgical History:  Procedure Laterality Date  . ABDOMINAL HYSTERECTOMY    . ANKLE SURGERY    . COLONOSCOPY  01/18/2020  . UPPER GI ENDOSCOPY     Social History   Social History Narrative  . Not on file   Immunization History  Administered Date(s) Administered  . Influenza,inj,Quad PF,6+ Mos 11/16/2018  . Moderna Sars-Covid-2 Vaccination 03/08/2019, 04/05/2019, 11/23/2019, 06/02/2020, 03/21/2021  . Pneumococcal Polysaccharide-23 11/16/2018  . Tdap 05/16/2010, 06/16/2020  . Zoster Recombinat (Shingrix) 11/16/2018, 01/16/2019     Objective: Vital Signs: There were no vitals taken for this visit.   Physical Exam   Musculoskeletal Exam: ***  CDAI Exam: CDAI Score: -- Patient Global: --; Provider Global: -- Swollen: --; Tender: -- Joint Exam 10/28/2021   No joint exam has been documented for this visit   There is currently no information documented on the homunculus. Go to the Rheumatology activity and complete the homunculus joint exam.  Investigation: No additional findings.  Imaging: DG Chest 2 View  Result Date: 10/06/2021 CLINICAL DATA:  Productive cough for 1 week EXAM: CHEST - 2 VIEW COMPARISON:  12/15/2020 FINDINGS: Frontal and lateral views of the chest demonstrate an unremarkable cardiac silhouette. No airspace disease, effusion, or pneumothorax. No acute bony abnormalities. IMPRESSION: 1.  No acute intrathoracic process. Electronically Signed   By: Randa Ngo M.D.   On: 10/06/2021 19:09    Recent Labs: Lab Results  Component Value Date   WBC 4.6 05/22/2021   HGB 14.0 05/22/2021   PLT 209 05/22/2021   NA 141 05/22/2021   K 4.1 05/22/2021   CL 106 05/22/2021   CO2 28 05/22/2021   GLUCOSE 85 05/22/2021   BUN 21 05/22/2021   CREATININE 0.61 05/22/2021   BILITOT 0.5 05/22/2021    ALKPHOS 53 06/16/2020   AST 19 05/22/2021   ALT 23 05/22/2021   PROT 6.4 05/22/2021   ALBUMIN 3.8 06/16/2020   CALCIUM 9.0 05/22/2021   GFRAA 114 08/26/2020   QFTBGOLDPLUS NEGATIVE 11/20/2019    Speciality Comments: PLQ Eye Exam: 06/02/2020 WNL with Pre-existing Toxo Scars @ [facility] Follow up  in 1 year.  Procedures:  No procedures performed Allergies: Penicillins   Assessment / Plan:     Visit Diagnoses: No diagnosis found.  Orders: No orders of the defined types were placed in this encounter.  No orders of the defined types were placed in this encounter.   Face-to-face time spent with patient was *** minutes. Greater than 50% of time was spent in counseling and coordination of care.  Follow-Up Instructions: No follow-ups on file.   Earnestine Mealing, CMA  Note - This record has been created using Editor, commissioning.  Chart creation errors have been sought, but may not always  have been located. Such creation errors do not reflect on  the standard of medical care.

## 2021-10-28 ENCOUNTER — Ambulatory Visit: Payer: BC Managed Care – PPO | Admitting: Rheumatology

## 2021-10-28 DIAGNOSIS — Q667 Congenital pes cavus, unspecified foot: Secondary | ICD-10-CM

## 2021-10-28 DIAGNOSIS — H2013 Chronic iridocyclitis, bilateral: Secondary | ICD-10-CM

## 2021-10-28 DIAGNOSIS — G8929 Other chronic pain: Secondary | ICD-10-CM

## 2021-10-28 DIAGNOSIS — M170 Bilateral primary osteoarthritis of knee: Secondary | ICD-10-CM

## 2021-10-28 DIAGNOSIS — Z8719 Personal history of other diseases of the digestive system: Secondary | ICD-10-CM

## 2021-10-28 DIAGNOSIS — U071 COVID-19: Secondary | ICD-10-CM

## 2021-10-28 DIAGNOSIS — Z8269 Family history of other diseases of the musculoskeletal system and connective tissue: Secondary | ICD-10-CM

## 2021-10-28 DIAGNOSIS — M5030 Other cervical disc degeneration, unspecified cervical region: Secondary | ICD-10-CM

## 2021-10-28 DIAGNOSIS — M5136 Other intervertebral disc degeneration, lumbar region: Secondary | ICD-10-CM

## 2021-10-28 DIAGNOSIS — Z79899 Other long term (current) drug therapy: Secondary | ICD-10-CM

## 2021-10-28 DIAGNOSIS — M0579 Rheumatoid arthritis with rheumatoid factor of multiple sites without organ or systems involvement: Secondary | ICD-10-CM

## 2021-10-28 DIAGNOSIS — Z85820 Personal history of malignant melanoma of skin: Secondary | ICD-10-CM

## 2021-11-06 DIAGNOSIS — R059 Cough, unspecified: Secondary | ICD-10-CM | POA: Diagnosis not present

## 2021-11-06 DIAGNOSIS — Z20822 Contact with and (suspected) exposure to covid-19: Secondary | ICD-10-CM | POA: Diagnosis not present

## 2021-11-06 DIAGNOSIS — J029 Acute pharyngitis, unspecified: Secondary | ICD-10-CM | POA: Diagnosis not present

## 2021-12-15 ENCOUNTER — Other Ambulatory Visit: Payer: Self-pay | Admitting: Family Medicine
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
## 2022-04-13 ENCOUNTER — Encounter: Payer: Self-pay | Admitting: Family Medicine

## 2022-04-13 ENCOUNTER — Ambulatory Visit (INDEPENDENT_AMBULATORY_CARE_PROVIDER_SITE_OTHER): Payer: BC Managed Care – PPO | Admitting: Family Medicine

## 2022-04-13 VITALS — BP 118/78 | HR 80 | Temp 98.1°F | Ht 68.0 in | Wt 258.1 lb

## 2022-04-13 DIAGNOSIS — G8929 Other chronic pain: Secondary | ICD-10-CM | POA: Diagnosis not present

## 2022-04-13 DIAGNOSIS — M545 Low back pain, unspecified: Secondary | ICD-10-CM

## 2022-04-13 MED ORDER — TIZANIDINE HCL 4 MG PO TABS: 4.0000 mg | ORAL_TABLET | Freq: Four times a day (QID) | ORAL | 0 refills | Status: DC | PRN

## 2022-04-13 NOTE — Progress Notes (Signed)
Musculoskeletal Exam  Patient: Brittany Malone DOB: 02/28/1966  DOS: 04/13/2022  SUBJECTIVE:  Chief Complaint:   Chief Complaint  Patient presents with   Back Pain    Brittany Malone is a 57 y.o.  female for evaluation and treatment of back pain.   Onset:  3 months ago. No inj or change in activity.  Location: lower R Character:  aching usually; sharp/stabbing when she moves Progression of issue:  worsened over past 2 weeks Associated symptoms: weakness with RLE Denies bowel/bladder incontinence or weakness, bruising, redness, swelling Treatment: to date has been OTC NSAIDS, acetaminophen, and heat.   Neurovascular symptoms: no  Past Medical History:  Diagnosis Date   Allergy    SEASONAL   Anemia    Arthritis    Back pain    Diverticulitis    Fatty liver    per patient   GERD (gastroesophageal reflux disease)    Insomnia    Joint pain    NAFLD (nonalcoholic fatty liver disease)    Rheumatoid arthritis ([place])     Objective:  VITAL SIGNS: BP 118/78 (BP Location: Left Arm, Patient Position: Sitting, Cuff Size: Large)   Pulse 80   Temp 98.1 F (36.7 C) (Oral)   Ht 5' 8"$  (1.727 m)   Wt 258 lb 2 oz (117.1 kg)   SpO2 97%   BMI 39.25 kg/m  Constitutional: Well formed, well developed. No acute distress. HENT: Normocephalic, atraumatic.  Thorax & Lungs:  No accessory muscle use Musculoskeletal: low back.   Tenderness to palpation: Yes, over bilateral lumbar paraspinal musculature, worse in the right Deformity: no Ecchymosis: no Straight leg test: negative for Poor hamstring flexibility b/l. Neurologic: Normal sensory function. No focal deficits noted. DTR's equal and symmetric in LE's. No clonus.  4/5 strength with right hip flexion and right knee extension, 5/5 strength in the lower extremities throughout otherwise.  Gait is antalgic. Psychiatric: Normal mood. Age appropriate judgment and insight. Alert & oriented x 3.    Assessment:  Chronic right-sided low  back pain without sciatica - Plan: tiZANidine (ZANAFLEX) 4 MG tablet, Ambulatory referral to Orthopedic Surgery  Plan: Chronic, uncontrolled.  Zanaflex 4 mg every 6-8 hours as needed.  Warned about possible drowsiness with this and she will take it 2 hours before planned bedtime tonight.  Referred to orthospine.  Stretches/exercises, heat, ice, Tylenol, NSAIDs. F/u for a physical as originally scheduled.. The patient voiced understanding and agreement to the plan.   [place], DO 04/13/22  12:22 PM

## 2022-04-13 NOTE — Patient Instructions (Addendum)

## 2022-05-12 ENCOUNTER — Encounter: Payer: Self-pay | Admitting: Family Medicine

## 2022-05-12 ENCOUNTER — Ambulatory Visit (INDEPENDENT_AMBULATORY_CARE_PROVIDER_SITE_OTHER): Payer: BC Managed Care – PPO | Admitting: Family Medicine

## 2022-05-12 VITALS — BP 108/71 | HR 71 | Temp 97.8°F | Ht 67.5 in | Wt 258.2 lb

## 2022-05-12 DIAGNOSIS — Z1231 Encounter for screening mammogram for malignant neoplasm of breast: Secondary | ICD-10-CM | POA: Diagnosis not present

## 2022-05-12 DIAGNOSIS — Z0000 Encounter for general adult medical examination without abnormal findings: Secondary | ICD-10-CM

## 2022-05-12 LAB — COMPREHENSIVE METABOLIC PANEL
ALT: 21 U/L (ref 0–35)
AST: 17 U/L (ref 0–37)
Albumin: 3.9 g/dL (ref 3.5–5.2)
Alkaline Phosphatase: 75 U/L (ref 39–117)
BUN: 17 mg/dL (ref 6–23)
CO2: 26 mEq/L (ref 19–32)
Calcium: 9.3 mg/dL (ref 8.4–10.5)
Chloride: 105 mEq/L (ref 96–112)
Creatinine, Ser: 0.65 mg/dL (ref 0.40–1.20)
GFR: 96.79 mL/min (ref >60.00)
Glucose, Bld: 98 mg/dL (ref 70–99)
Potassium: 4.4 mEq/L (ref 3.5–5.1)
Sodium: 139 mEq/L (ref 135–145)
Total Bilirubin: 0.6 mg/dL (ref 0.2–1.2)
Total Protein: 6.4 g/dL (ref 6.0–8.3)

## 2022-05-12 LAB — CBC
HCT: 40.4 % (ref 36.0–46.0)
Hemoglobin: 13.9 g/dL (ref 12.0–15.0)
MCHC: 34.3 g/dL (ref 30.0–36.0)
MCV: 87.5 fl (ref 78.0–100.0)
Platelets: 237 10*3/uL (ref 150.0–400.0)
RBC: 4.62 Mil/uL (ref 3.87–5.11)
RDW: 12.6 % (ref 11.5–15.5)
WBC: 5.2 10*3/uL (ref 4.0–10.5)

## 2022-05-12 LAB — LIPID PANEL
Cholesterol: 176 mg/dL (ref 0–200)
HDL: 64 mg/dL (ref >39.00)
LDL Cholesterol: 93 mg/dL (ref 0–99)
NonHDL: 111.91
Total CHOL/HDL Ratio: 3
Triglycerides: 93 mg/dL (ref 0.0–149.0)
VLDL: 18.6 mg/dL (ref 0.0–40.0)

## 2022-05-12 NOTE — Patient Instructions (Signed)
Give us 2-3 business days to get the results of your labs back.   Keep the diet clean and stay active.  Please get me a copy of your advanced directive form at your convenience.   Let us know if you need anything.  

## 2022-05-12 NOTE — Progress Notes (Signed)
Chief Complaint  Patient presents with   Annual Exam    Back pain is still a problem, but is a little better     Brittany Malone is here for a complete physical.   Her last physical was >1 year ago.  Current diet: in general, a "sometimes healthy" diet. Current exercise: walking a little. Weight is stable from last visit, had increased previously and she denies fatigue out of ordinary. Seatbelt? Yes Advanced directive? No  Health Maintenance Pap/HPV- Yes Mammogram- Yes Colon cancer screening-Yes Shingrix- Yes Tetanus- Yes Hep C screening- Yes HIV screening- Yes  Past Medical History:  Diagnosis Date   Allergy    SEASONAL   Anemia    Arthritis    Back pain    Diverticulitis    Fatty liver    per patient   GERD (gastroesophageal reflux disease)    Insomnia    Joint pain    NAFLD (nonalcoholic fatty liver disease)    Rheumatoid arthritis ([place])      Past Surgical History:  Procedure Laterality Date   ABDOMINAL HYSTERECTOMY     ANKLE SURGERY     COLONOSCOPY  01/18/2020   UPPER GI ENDOSCOPY      Medications  Current Outpatient Medications on File Prior to Visit  Medication Sig Dispense Refill   acetaminophen (TYLENOL) 325 MG tablet Take 650 mg by mouth every 6 (six) hours as needed.     albuterol (VENTOLIN HFA) 108 (90 Base) MCG/ACT inhaler Inhale 2 puffs into the lungs every 6 (six) hours as needed for wheezing or shortness of breath. 8 g 0   amitriptyline (ELAVIL) 50 MG tablet TAKE 1 TABLET BY MOUTH EVERY DAY AT BEDTIME AS NEEDED FOR SLEEP 90 tablet 2   ferrous sulfate 325 (65 FE) MG tablet Take by mouth.     fluticasone (FLONASE) 50 MCG/ACT nasal spray Place into both nostrils as needed for allergies or rhinitis.     folic acid (FOLVITE) 1 MG tablet TAKE 2 TABLETS BY MOUTH EVERY DAY 180 tablet 2   hydroxychloroquine (PLAQUENIL) 200 MG tablet TAKE 1 TABLET BY MOUTH EVERY DAY 30 tablet 0   MELATONIN PO Take by mouth as needed.     Methotrexate, PF,  (OTREXUP) 25 MG/0.4ML SOAJ Inject 25 mg into the skin once a week. 4 mL 2   polyethylene glycol (MIRALAX / GLYCOLAX) 17 g packet Take 17 g by mouth daily as needed. 30 packet 0   tiZANidine (ZANAFLEX) 4 MG tablet Take 1 tablet (4 mg total) by mouth every 6 (six) hours as needed for muscle spasms. 30 tablet 0   TUBERCULIN SYR 1CC/27GX1/2" 27G X 1/2" 1 ML MISC Use 1 syringe once weekly to inject methotrexate. 12 each 2   No current facility-administered medications on file prior to visit.     Allergies Allergies  Allergen Reactions   Penicillins Anaphylaxis    Review of Systems: Constitutional:  no unexpected weight changes Eye:  no recent significant change in vision Ear/Nose/Mouth/Throat:  Ears:  no recent change in hearing Nose/Mouth/Throat:  no complaints of nasal congestion, no sore throat Cardiovascular: no chest pain Respiratory:  no shortness of breath Gastrointestinal:  no abdominal pain, no change in bowel habits GU:  Female: negative for dysuria or pelvic pain Musculoskeletal/Extremities:  no new pain of the joints; +chronic low back pain and neck pain Integumentary (Skin/Breast):  no abnormal skin lesions reported Neurologic:  no headaches Endocrine:  denies fatigue  Exam BP 108/71 (BP Location:  Left Arm, Patient Position: Sitting, Cuff Size: Large)   Pulse 71   Temp 97.8 F (36.6 C) (Oral)   Ht 5' 7.5" (1.715 m)   Wt 258 lb 4 oz (117.1 kg)   SpO2 96%   BMI 39.85 kg/m  General:  Brittany developed, Brittany nourished, in no apparent distress Skin:  no significant moles, warts, or growths Head:  no masses, lesions, or tenderness Eyes:  pupils equal and round, sclera anicteric without injection Ears:  canals without lesions, TMs shiny without retraction, no obvious effusion, no erythema Nose:  nares patent, mucosa normal, and no drainage  Throat/Pharynx:  lips and gingiva without lesion; tongue and uvula midline; non-inflamed pharynx; no exudates or postnasal  drainage Neck: neck supple without adenopathy, thyromegaly, or masses Lungs:  clear to auscultation, breath sounds equal bilaterally, no respiratory distress Cardio:  regular rate and rhythm, no LE edema Abdomen:  abdomen soft, nontender; bowel sounds normal; no masses or organomegaly Genital: Defer to GYN Musculoskeletal:  symmetrical muscle groups noted without atrophy or deformity Extremities:  no clubbing, cyanosis, or edema, no deformities, no skin discoloration Neuro:  gait normal; deep tendon reflexes normal and symmetric Psych: Brittany oriented with normal range of affect and appropriate judgment/insight  Assessment and Plan  Brittany adult exam - Plan: CBC, Comprehensive metabolic panel, Lipid panel  Encounter for screening mammogram for malignant neoplasm of breast - Plan: MM DIGITAL SCREENING BILATERAL   Brittany 56 y.o. female. Counseled on diet and exercise. Other orders as above. Advanced directive form provided today.  # for ortho team provided today.  Follow up in 6 mo or prn. The patient voiced understanding and agreement to the plan.  [place], DO 05/12/22 7:16 AM

## 2022-05-14 DIAGNOSIS — M542 Cervicalgia: Secondary | ICD-10-CM | POA: Diagnosis not present

## 2022-05-14 DIAGNOSIS — G8929 Other chronic pain: Secondary | ICD-10-CM | POA: Diagnosis not present

## 2022-05-14 DIAGNOSIS — M545 Low back pain, unspecified: Secondary | ICD-10-CM | POA: Diagnosis not present

## 2022-05-25 DIAGNOSIS — M545 Low back pain, unspecified: Secondary | ICD-10-CM | POA: Diagnosis not present

## 2022-05-25 DIAGNOSIS — R6889 Other general symptoms and signs: Secondary | ICD-10-CM | POA: Diagnosis not present

## 2022-05-25 DIAGNOSIS — R531 Weakness: Secondary | ICD-10-CM | POA: Diagnosis not present

## 2022-05-25 DIAGNOSIS — Z7409 Other reduced mobility: Secondary | ICD-10-CM | POA: Diagnosis not present

## 2022-05-25 DIAGNOSIS — M542 Cervicalgia: Secondary | ICD-10-CM | POA: Diagnosis not present

## 2022-05-25 DIAGNOSIS — R293 Abnormal posture: Secondary | ICD-10-CM | POA: Diagnosis not present

## 2022-05-25 DIAGNOSIS — G8929 Other chronic pain: Secondary | ICD-10-CM | POA: Diagnosis not present

## 2022-05-25 DIAGNOSIS — M2560 Stiffness of unspecified joint, not elsewhere classified: Secondary | ICD-10-CM | POA: Diagnosis not present

## 2022-05-31 DIAGNOSIS — G8929 Other chronic pain: Secondary | ICD-10-CM | POA: Diagnosis not present

## 2022-05-31 DIAGNOSIS — Z7409 Other reduced mobility: Secondary | ICD-10-CM | POA: Diagnosis not present

## 2022-05-31 DIAGNOSIS — M545 Low back pain, unspecified: Secondary | ICD-10-CM | POA: Diagnosis not present

## 2022-05-31 DIAGNOSIS — M542 Cervicalgia: Secondary | ICD-10-CM | POA: Diagnosis not present

## 2022-05-31 DIAGNOSIS — R293 Abnormal posture: Secondary | ICD-10-CM | POA: Diagnosis not present

## 2022-05-31 DIAGNOSIS — R6889 Other general symptoms and signs: Secondary | ICD-10-CM | POA: Diagnosis not present

## 2022-05-31 DIAGNOSIS — M2560 Stiffness of unspecified joint, not elsewhere classified: Secondary | ICD-10-CM | POA: Diagnosis not present

## 2022-05-31 DIAGNOSIS — R531 Weakness: Secondary | ICD-10-CM | POA: Diagnosis not present

## 2022-06-04 DIAGNOSIS — R531 Weakness: Secondary | ICD-10-CM | POA: Diagnosis not present

## 2022-06-04 DIAGNOSIS — G8929 Other chronic pain: Secondary | ICD-10-CM | POA: Diagnosis not present

## 2022-06-04 DIAGNOSIS — R6889 Other general symptoms and signs: Secondary | ICD-10-CM | POA: Diagnosis not present

## 2022-06-04 DIAGNOSIS — Z7409 Other reduced mobility: Secondary | ICD-10-CM | POA: Diagnosis not present

## 2022-06-04 DIAGNOSIS — M2560 Stiffness of unspecified joint, not elsewhere classified: Secondary | ICD-10-CM | POA: Diagnosis not present

## 2022-06-04 DIAGNOSIS — M545 Low back pain, unspecified: Secondary | ICD-10-CM | POA: Diagnosis not present

## 2022-06-04 DIAGNOSIS — R293 Abnormal posture: Secondary | ICD-10-CM | POA: Diagnosis not present

## 2022-06-04 DIAGNOSIS — M542 Cervicalgia: Secondary | ICD-10-CM | POA: Diagnosis not present

## 2022-06-10 ENCOUNTER — Other Ambulatory Visit: Payer: Self-pay | Admitting: Family Medicine

## 2022-06-10 DIAGNOSIS — M2560 Stiffness of unspecified joint, not elsewhere classified: Secondary | ICD-10-CM | POA: Diagnosis not present

## 2022-06-10 DIAGNOSIS — G8929 Other chronic pain: Secondary | ICD-10-CM | POA: Diagnosis not present

## 2022-06-10 DIAGNOSIS — M545 Low back pain, unspecified: Secondary | ICD-10-CM | POA: Diagnosis not present

## 2022-06-10 DIAGNOSIS — R6889 Other general symptoms and signs: Secondary | ICD-10-CM | POA: Diagnosis not present

## 2022-06-10 DIAGNOSIS — R531 Weakness: Secondary | ICD-10-CM | POA: Diagnosis not present

## 2022-06-10 DIAGNOSIS — M542 Cervicalgia: Secondary | ICD-10-CM | POA: Diagnosis not present

## 2022-06-10 DIAGNOSIS — Z7409 Other reduced mobility: Secondary | ICD-10-CM | POA: Diagnosis not present

## 2022-06-10 DIAGNOSIS — R293 Abnormal posture: Secondary | ICD-10-CM | POA: Diagnosis not present

## 2022-06-14 DIAGNOSIS — Z7409 Other reduced mobility: Secondary | ICD-10-CM | POA: Diagnosis not present

## 2022-06-14 DIAGNOSIS — R531 Weakness: Secondary | ICD-10-CM | POA: Diagnosis not present

## 2022-06-14 DIAGNOSIS — R6889 Other general symptoms and signs: Secondary | ICD-10-CM | POA: Diagnosis not present

## 2022-06-14 DIAGNOSIS — M545 Low back pain, unspecified: Secondary | ICD-10-CM | POA: Diagnosis not present

## 2022-06-14 DIAGNOSIS — M542 Cervicalgia: Secondary | ICD-10-CM | POA: Diagnosis not present

## 2022-06-14 DIAGNOSIS — M2560 Stiffness of unspecified joint, not elsewhere classified: Secondary | ICD-10-CM | POA: Diagnosis not present

## 2022-06-14 DIAGNOSIS — G8929 Other chronic pain: Secondary | ICD-10-CM | POA: Diagnosis not present

## 2022-06-14 DIAGNOSIS — R293 Abnormal posture: Secondary | ICD-10-CM | POA: Diagnosis not present

## 2022-06-17 DIAGNOSIS — M5416 Radiculopathy, lumbar region: Secondary | ICD-10-CM | POA: Diagnosis not present

## 2022-06-17 DIAGNOSIS — M542 Cervicalgia: Secondary | ICD-10-CM | POA: Diagnosis not present

## 2022-06-26 DIAGNOSIS — M5416 Radiculopathy, lumbar region: Secondary | ICD-10-CM | POA: Diagnosis not present

## 2022-06-26 DIAGNOSIS — M47812 Spondylosis without myelopathy or radiculopathy, cervical region: Secondary | ICD-10-CM | POA: Diagnosis not present

## 2022-06-26 DIAGNOSIS — M5126 Other intervertebral disc displacement, lumbar region: Secondary | ICD-10-CM | POA: Diagnosis not present

## 2022-06-26 DIAGNOSIS — M542 Cervicalgia: Secondary | ICD-10-CM | POA: Diagnosis not present

## 2022-06-26 DIAGNOSIS — M47816 Spondylosis without myelopathy or radiculopathy, lumbar region: Secondary | ICD-10-CM | POA: Diagnosis not present

## 2022-06-26 DIAGNOSIS — M4802 Spinal stenosis, cervical region: Secondary | ICD-10-CM | POA: Diagnosis not present

## 2022-06-30 DIAGNOSIS — M542 Cervicalgia: Secondary | ICD-10-CM | POA: Diagnosis not present

## 2022-06-30 DIAGNOSIS — M545 Low back pain, unspecified: Secondary | ICD-10-CM | POA: Diagnosis not present

## 2022-06-30 DIAGNOSIS — G8929 Other chronic pain: Secondary | ICD-10-CM | POA: Diagnosis not present

## 2022-07-08 DIAGNOSIS — M542 Cervicalgia: Secondary | ICD-10-CM | POA: Diagnosis not present

## 2022-07-08 DIAGNOSIS — M545 Low back pain, unspecified: Secondary | ICD-10-CM | POA: Diagnosis not present

## 2022-07-08 DIAGNOSIS — G8929 Other chronic pain: Secondary | ICD-10-CM | POA: Diagnosis not present

## 2022-07-19 DIAGNOSIS — M545 Low back pain, unspecified: Secondary | ICD-10-CM | POA: Diagnosis not present

## 2022-07-19 DIAGNOSIS — G8929 Other chronic pain: Secondary | ICD-10-CM | POA: Diagnosis not present

## 2022-07-19 DIAGNOSIS — M47817 Spondylosis without myelopathy or radiculopathy, lumbosacral region: Secondary | ICD-10-CM | POA: Diagnosis not present

## 2022-08-02 DIAGNOSIS — M47816 Spondylosis without myelopathy or radiculopathy, lumbar region: Secondary | ICD-10-CM | POA: Diagnosis not present

## 2022-08-02 DIAGNOSIS — M47817 Spondylosis without myelopathy or radiculopathy, lumbosacral region: Secondary | ICD-10-CM | POA: Diagnosis not present

## 2022-08-16 DIAGNOSIS — M47816 Spondylosis without myelopathy or radiculopathy, lumbar region: Secondary | ICD-10-CM | POA: Diagnosis not present

## 2022-08-16 DIAGNOSIS — M47817 Spondylosis without myelopathy or radiculopathy, lumbosacral region: Secondary | ICD-10-CM | POA: Diagnosis not present

## 2022-08-30 DIAGNOSIS — M47816 Spondylosis without myelopathy or radiculopathy, lumbar region: Secondary | ICD-10-CM | POA: Diagnosis not present

## 2022-09-04 ENCOUNTER — Other Ambulatory Visit: Payer: Self-pay | Admitting: Family Medicine

## 2022-09-07 ENCOUNTER — Other Ambulatory Visit (HOSPITAL_BASED_OUTPATIENT_CLINIC_OR_DEPARTMENT_OTHER): Payer: Self-pay

## 2022-09-07 ENCOUNTER — Encounter: Payer: Self-pay | Admitting: Family Medicine

## 2022-09-07 ENCOUNTER — Ambulatory Visit (INDEPENDENT_AMBULATORY_CARE_PROVIDER_SITE_OTHER): Payer: BC Managed Care – PPO | Admitting: Family Medicine

## 2022-09-07 VITALS — BP 138/85 | HR 84 | Temp 98.1°F | Ht 68.0 in | Wt 267.4 lb

## 2022-09-07 DIAGNOSIS — J0100 Acute maxillary sinusitis, unspecified: Secondary | ICD-10-CM

## 2022-09-07 DIAGNOSIS — J069 Acute upper respiratory infection, unspecified: Secondary | ICD-10-CM | POA: Diagnosis not present

## 2022-09-07 MED ORDER — DOXYCYCLINE HYCLATE 100 MG PO TABS
100.0000 mg | ORAL_TABLET | Freq: Two times a day (BID) | ORAL | 0 refills | Status: AC
  Filled 2022-09-07: qty 14, 7d supply, fill #0

## 2022-09-07 MED ORDER — PROMETHAZINE-DM 6.25-15 MG/5ML PO SYRP
5.0000 mL | ORAL_SOLUTION | Freq: Four times a day (QID) | ORAL | 0 refills | Status: DC | PRN
  Filled 2022-09-07: qty 118, 6d supply, fill #0

## 2022-09-07 NOTE — Progress Notes (Signed)
Chief Complaint  Patient presents with   Cough    For 2 weeks Has completed a zpack   Ear Fullness    Tested several times for Covid and were negative     Brittany Malone here for URI complaints.  Duration: 2 weeks; was improving and worsened 2 d ago Associated symptoms: sinus headache, sinus congestion, sinus pain, rhinorrhea, itchy watery eyes, ear fullness, DOE, sore throat, and dry cough Denies: ear pain, ear drainage, wheezing, myalgia, and fevers Treatment to date: Zpak, Mucinex, Sudafed, ibuprofen, Tylenol Sick contacts: No  Past Medical History:  Diagnosis Date   Allergy    SEASONAL   Anemia    Arthritis    Back pain    Diverticulitis    Fatty liver    per patient   GERD (gastroesophageal reflux disease)    Insomnia    Joint pain    NAFLD (nonalcoholic fatty liver disease)    Rheumatoid arthritis ([facility])     Objective BP 138/85 (BP Location: Left Arm, Patient Position: Sitting, Cuff Size: Large)   Pulse 84   Temp 98.1 F (36.7 C) (Oral)   Ht 5\' 8"  (1.727 m)   Wt 267 lb 6 oz (121.3 kg)   SpO2 97%   BMI 40.65 kg/m  General: Awake, alert, appears stated age HEENT: AT, [place], ears patent b/l and TM's neg, nares patent w/o discharge, pharynx pink and without exudates, MMM, max sinuses ttp b/l Neck: No masses or asymmetry Heart: RRR Lungs: CTAB, no accessory muscle use Psych: Age appropriate judgment and insight, normal mood and affect  Acute maxillary sinusitis, recurrence not specified - Plan: doxycycline (VIBRA-TABS) 100 MG tablet  Viral URI with cough - Plan: promethazine-dextromethorphan (PROMETHAZINE-DM) 6.25-15 MG/5ML syrup  Given 2 week duration, 7 d of doxy. Cough syrup for cough, warned about drowsiness. Continue to push fluids, practice good hand hygiene, cover mouth when coughing. F/u prn. If starting to experience fevers, shaking, or worsening shortness of breath, seek immediate care. Pt voiced understanding and agreement to the plan.  [name], DO 09/07/22 3:47 PM

## 2022-09-07 NOTE — Patient Instructions (Addendum)
Continue to push fluids, practice good hand hygiene, and cover your mouth if you cough.  If you start having fevers, shaking or shortness of breath, seek immediate care.  OK to take Tylenol 1000 mg (2 extra strength tabs) or 975 mg (3 regular strength tabs) every 6 hours as needed.  Take the syrup at night as it can make you drowsy.   Let us know if you need anything.

## 2022-09-10 ENCOUNTER — Other Ambulatory Visit (HOSPITAL_COMMUNITY): Payer: Self-pay

## 2022-09-10 ENCOUNTER — Encounter: Payer: Self-pay | Admitting: Family Medicine

## 2022-09-10 ENCOUNTER — Other Ambulatory Visit: Payer: Self-pay | Admitting: Family Medicine

## 2022-09-10 ENCOUNTER — Other Ambulatory Visit (HOSPITAL_BASED_OUTPATIENT_CLINIC_OR_DEPARTMENT_OTHER): Payer: Self-pay

## 2022-09-10 MED ORDER — PREDNISONE 20 MG PO TABS
40.0000 mg | ORAL_TABLET | Freq: Every day | ORAL | 0 refills | Status: AC
  Filled 2022-09-10 (×2): qty 10, 5d supply, fill #0
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
## 2022-09-28 ENCOUNTER — Ambulatory Visit: Payer: BC Managed Care – PPO | Attending: Physician Assistant | Admitting: Physician Assistant

## 2022-09-28 ENCOUNTER — Encounter: Payer: Self-pay | Admitting: Physician Assistant

## 2022-09-28 ENCOUNTER — Telehealth: Payer: Self-pay | Admitting: Pharmacist

## 2022-09-28 ENCOUNTER — Other Ambulatory Visit (HOSPITAL_BASED_OUTPATIENT_CLINIC_OR_DEPARTMENT_OTHER): Payer: Self-pay

## 2022-09-28 ENCOUNTER — Other Ambulatory Visit: Payer: Self-pay

## 2022-09-28 VITALS — BP 121/87 | HR 85 | Resp 17 | Ht 67.75 in | Wt 271.8 lb

## 2022-09-28 DIAGNOSIS — Z79899 Other long term (current) drug therapy: Secondary | ICD-10-CM | POA: Diagnosis not present

## 2022-09-28 DIAGNOSIS — Z8269 Family history of other diseases of the musculoskeletal system and connective tissue: Secondary | ICD-10-CM

## 2022-09-28 DIAGNOSIS — H2013 Chronic iridocyclitis, bilateral: Secondary | ICD-10-CM | POA: Diagnosis not present

## 2022-09-28 DIAGNOSIS — M0579 Rheumatoid arthritis with rheumatoid factor of multiple sites without organ or systems involvement: Secondary | ICD-10-CM

## 2022-09-28 DIAGNOSIS — M170 Bilateral primary osteoarthritis of knee: Secondary | ICD-10-CM

## 2022-09-28 DIAGNOSIS — U071 COVID-19: Secondary | ICD-10-CM

## 2022-09-28 DIAGNOSIS — M5136 Other intervertebral disc degeneration, lumbar region: Secondary | ICD-10-CM

## 2022-09-28 DIAGNOSIS — Z8719 Personal history of other diseases of the digestive system: Secondary | ICD-10-CM

## 2022-09-28 DIAGNOSIS — G8929 Other chronic pain: Secondary | ICD-10-CM

## 2022-09-28 DIAGNOSIS — M5030 Other cervical disc degeneration, unspecified cervical region: Secondary | ICD-10-CM

## 2022-09-28 DIAGNOSIS — Z85820 Personal history of malignant melanoma of skin: Secondary | ICD-10-CM

## 2022-09-28 DIAGNOSIS — M533 Sacrococcygeal disorders, not elsewhere classified: Secondary | ICD-10-CM

## 2022-09-28 DIAGNOSIS — Q667 Congenital pes cavus, unspecified foot: Secondary | ICD-10-CM

## 2022-09-28 LAB — CBC WITH DIFFERENTIAL/PLATELET
Absolute Monocytes: 493 cells/uL (ref 200–950)
Basophils Absolute: 28 cells/uL (ref 0–200)
Basophils Relative: 0.5 %
Eosinophils Absolute: 151 cells/uL (ref 15–500)
Eosinophils Relative: 2.7 %
HCT: 40.4 % (ref 35.0–45.0)
Hemoglobin: 13.8 g/dL (ref 11.7–15.5)
Lymphs Abs: 1243 cells/uL (ref 850–3900)
MCH: 29.9 pg (ref 27.0–33.0)
MCHC: 34.2 g/dL (ref 32.0–36.0)
MCV: 87.6 fL (ref 80.0–100.0)
MPV: 9.7 fL (ref 7.5–12.5)
Monocytes Relative: 8.8 %
Neutro Abs: 3685 cells/uL (ref 1500–7800)
Neutrophils Relative %: 65.8 %
Platelets: 202 10*3/uL (ref 140–400)
RBC: 4.61 10*6/uL (ref 3.80–5.10)
RDW: 12.6 % (ref 11.0–15.0)
Total Lymphocyte: 22.2 %
WBC: 5.6 10*3/uL (ref 3.8–10.8)

## 2022-09-28 MED ORDER — HYDROXYCHLOROQUINE SULFATE 200 MG PO TABS
200.0000 mg | ORAL_TABLET | Freq: Every day | ORAL | 0 refills | Status: DC
  Filled 2022-09-28: qty 90, 90d supply, fill #0

## 2022-09-28 MED ORDER — FOLIC ACID 1 MG PO TABS
2.0000 mg | ORAL_TABLET | Freq: Every day | ORAL | 2 refills | Status: DC
  Filled 2022-09-28: qty 180, 90d supply, fill #0
  Filled 2023-02-27: qty 180, 90d supply, fill #1
  Filled 2023-06-27: qty 180, 90d supply, fill #2

## 2022-09-28 NOTE — Telephone Encounter (Signed)
Patient restarting Otrexup. Please start BIV.  She was previously filling with [facility] and will need copay card enrollment as well  Chesley Mires, PharmD, MPH, BCPS, CPP Clinical Pharmacist (Rheumatology and Pulmonology)

## 2022-09-28 NOTE — Patient Instructions (Signed)
Standing Labs We placed an order today for your standing lab work.   Please have your standing labs drawn at the end of October and every 3 months   Please have your labs drawn 2 weeks prior to your appointment so that the provider can discuss your lab results at your appointment, if possible.  Please note that you may see your imaging and lab results in MyChart before we have reviewed them. We will contact you once all results are reviewed. Please allow our office up to 72 hours to thoroughly review all of the results before contacting the office for clarification of your results.  WALK-IN LAB HOURS  Monday through Thursday from 8:00 am -12:30 pm and 1:00 pm-5:00 pm and Friday from 8:00 am-12:00 pm.  Patients with office visits requiring labs will be seen before walk-in labs.  You may encounter longer than normal wait times. Please allow additional time. Wait times may be shorter on  Monday and Thursday afternoons.  We do not book appointments for walk-in labs. We appreciate your patience and understanding with our staff.   Labs are drawn by [facility]. Please bring your co-pay at the time of your lab draw.  You may receive a bill from [facility] for your lab work.  Please note if you are on Hydroxychloroquine and and an order has been placed for a Hydroxychloroquine level,  you will need to have it drawn 4 hours or more after your last dose.  If you wish to have your labs drawn at another location, please call the office 24 hours in advance so we can fax the orders.  The office is located at [address], [address], [place], [place] [address]   If you have any questions regarding directions or hours of operation,  please call [phone].   As a reminder, please drink plenty of water prior to coming for your lab work. Thanks!

## 2022-09-28 NOTE — Telephone Encounter (Signed)
Submitted a Prior Authorization request to [facility] for OTREXUP via CoverMyMeds. Pending clinical questions to populate  Key: [id number]

## 2022-09-28 NOTE — Telephone Encounter (Signed)
Please review and sign pended refills for PLQ and folic acid.  Pharmacy team is re-applying for Otrexup.  Thanks!

## 2022-09-29 ENCOUNTER — Other Ambulatory Visit: Payer: Self-pay

## 2022-09-29 ENCOUNTER — Other Ambulatory Visit (HOSPITAL_COMMUNITY): Payer: Self-pay

## 2022-09-29 ENCOUNTER — Other Ambulatory Visit (HOSPITAL_BASED_OUTPATIENT_CLINIC_OR_DEPARTMENT_OTHER): Payer: Self-pay

## 2022-09-29 MED ORDER — OTREXUP 25 MG/0.4ML ~~LOC~~ SOAJ
25.0000 mg | SUBCUTANEOUS | 2 refills | Status: DC
  Filled 2022-09-29 (×2): qty 1.6, 28d supply, fill #0
  Filled 2023-02-27 – 2023-04-15 (×2): qty 1.6, 28d supply, fill #1
  Filled 2023-05-22: qty 1.6, 28d supply, fill #2

## 2022-09-29 NOTE — Telephone Encounter (Signed)
Clinical questions for Otrexup PA completed on CMM  Key: [id number]   Chesley Mires, PharmD, MPH, BCPS, CPP Clinical Pharmacist (Rheumatology and Pulmonology)

## 2022-09-29 NOTE — Progress Notes (Signed)
CBC and CMP WNL

## 2022-09-29 NOTE — Telephone Encounter (Signed)
Received notification from [facility] regarding a prior authorization for OTREXUP. Authorization has been APPROVED from 09/29/2022 to 09/29/2023. Approval letter sent to scan center.  Per test claim, copay for 28 days supply is $0  Patient can fill through [facility]: [phone]   Authorization # [id number] Phone # [phone]  Enrolled patient into Otrexup copay card:   Chesley Mires, PharmD, MPH, BCPS, CPP Clinical Pharmacist (Rheumatology and Pulmonology)

## 2022-09-30 ENCOUNTER — Other Ambulatory Visit: Payer: Self-pay

## 2022-10-06 DIAGNOSIS — Z1231 Encounter for screening mammogram for malignant neoplasm of breast: Secondary | ICD-10-CM | POA: Diagnosis not present

## 2022-10-06 LAB — HM MAMMOGRAPHY

## 2022-10-14 DIAGNOSIS — M47816 Spondylosis without myelopathy or radiculopathy, lumbar region: Secondary | ICD-10-CM | POA: Diagnosis not present

## 2022-10-19 DIAGNOSIS — H31013 Macula scars of posterior pole (postinflammatory) (post-traumatic), bilateral: Secondary | ICD-10-CM | POA: Diagnosis not present

## 2022-10-19 DIAGNOSIS — B5801 Toxoplasma chorioretinitis: Secondary | ICD-10-CM | POA: Diagnosis not present

## 2022-10-19 DIAGNOSIS — Z79899 Other long term (current) drug therapy: Secondary | ICD-10-CM | POA: Diagnosis not present

## 2022-11-02 ENCOUNTER — Ambulatory Visit (INDEPENDENT_AMBULATORY_CARE_PROVIDER_SITE_OTHER): Payer: BC Managed Care – PPO | Admitting: Internal Medicine

## 2022-11-23 ENCOUNTER — Other Ambulatory Visit (HOSPITAL_BASED_OUTPATIENT_CLINIC_OR_DEPARTMENT_OTHER): Payer: Self-pay

## 2022-11-23 MED ORDER — CEPHALEXIN 500 MG PO CAPS
500.0000 mg | ORAL_CAPSULE | Freq: Four times a day (QID) | ORAL | 0 refills | Status: DC
  Filled 2022-11-23: qty 28, 7d supply, fill #0

## 2023-01-12 ENCOUNTER — Other Ambulatory Visit (HOSPITAL_BASED_OUTPATIENT_CLINIC_OR_DEPARTMENT_OTHER): Payer: Self-pay

## 2023-01-12 ENCOUNTER — Other Ambulatory Visit (HOSPITAL_COMMUNITY): Payer: Self-pay

## 2023-01-12 MED FILL — Amitriptyline HCl Tab 50 MG: ORAL | 90 days supply | Qty: 90 | Fill #0 | Status: AC

## 2023-02-20 ENCOUNTER — Other Ambulatory Visit: Payer: Self-pay | Admitting: Urgent Care

## 2023-02-20 ENCOUNTER — Ambulatory Visit
Admission: EM | Admit: 2023-02-20 | Discharge: 2023-02-20 | Disposition: A | Discharge status: Home / Self Care | Payer: BC Managed Care – PPO | Attending: Family Medicine | Admitting: Family Medicine

## 2023-02-20 ENCOUNTER — Ambulatory Visit (INDEPENDENT_AMBULATORY_CARE_PROVIDER_SITE_OTHER): Payer: BC Managed Care – PPO

## 2023-02-20 DIAGNOSIS — R058 Other specified cough: Secondary | ICD-10-CM | POA: Diagnosis not present

## 2023-02-20 DIAGNOSIS — R053 Chronic cough: Secondary | ICD-10-CM | POA: Diagnosis not present

## 2023-02-20 DIAGNOSIS — J329 Chronic sinusitis, unspecified: Secondary | ICD-10-CM | POA: Diagnosis not present

## 2023-02-20 DIAGNOSIS — J189 Pneumonia, unspecified organism: Secondary | ICD-10-CM | POA: Diagnosis not present

## 2023-02-20 DIAGNOSIS — R918 Other nonspecific abnormal finding of lung field: Secondary | ICD-10-CM | POA: Diagnosis not present

## 2023-02-20 DIAGNOSIS — J40 Bronchitis, not specified as acute or chronic: Secondary | ICD-10-CM | POA: Diagnosis not present

## 2023-02-20 MED ORDER — CEFDINIR 300 MG PO CAPS: 300.0000 mg | ORAL_CAPSULE | Freq: Two times a day (BID) | ORAL | 0 refills | Status: DC

## 2023-02-20 MED ORDER — PROMETHAZINE-DM 6.25-15 MG/5ML PO SYRP: 5.0000 mL | ORAL_SOLUTION | Freq: Three times a day (TID) | ORAL | 0 refills | Status: DC | PRN

## 2023-02-20 MED ORDER — AZITHROMYCIN 250 MG PO TABS: ORAL_TABLET | ORAL | 0 refills | Status: DC

## 2023-02-20 MED ORDER — PREDNISONE 20 MG PO TABS: ORAL_TABLET | ORAL | 0 refills | Status: DC

## 2023-02-20 NOTE — Discharge Instructions (Addendum)
Please start taking cefdinir and prednisone to address a sinobronchitis infection.  Continue taking Zyrtec.  Use cough syrup as needed.  I will update you with your chest x-ray results later today.

## 2023-02-20 NOTE — ED Provider Notes (Addendum)
[facility]  Note:  This document was prepared using Conservation officer, historic buildings and may include unintentional dictation errors.  MRN: [id number] DOB: 1965/05/29  Subjective:   Brittany Malone is a 58 y.o. female presenting for 9 day history of acute onset persistent sinus congestion, sinus drainage now having a more productive cough, chest pressure and congestion, bilateral ear fullness and pain.  Takes Zyrtec daily for her allergies.  Has been using supportive care. No smoking of any kind including cigarettes, cigars, vaping, marijuana use.  Patient has previously tolerated cephalosporins.  No current facility-administered medications for this encounter.  Current Outpatient Medications:    acetaminophen (TYLENOL) 325 MG tablet, Take 650 mg by mouth every 6 (six) hours as needed., Disp: , Rfl:    albuterol (VENTOLIN HFA) 108 (90 Base) MCG/ACT inhaler, Inhale 2 puffs into the lungs every 6 (six) hours as needed for wheezing or shortness of breath., Disp: 8 g, Rfl: 0   amitriptyline (ELAVIL) 50 MG tablet, TAKE 1 TABLET BY MOUTH AT BEDTIME AS NEEDED FOR SLEEP, Disp: 90 tablet, Rfl: 2   cephALEXin (KEFLEX) 500 MG capsule, Take 2 capsules (1000mg  ) for first dose then Take 1 capsule (500 mg total) by mouth every 6 (six) hours until gone, Disp: 28 capsule, Rfl: 0   ferrous sulfate 325 (65 FE) MG tablet, Take by mouth. (Patient not taking: Reported on 09/28/2022), Disp: , Rfl:    fluticasone (FLONASE) 50 MCG/ACT nasal spray, Place into both nostrils as needed for allergies or rhinitis., Disp: , Rfl:    folic acid (FOLVITE) 1 MG tablet, Take 2 tablets (2 mg total) by mouth daily., Disp: 180 tablet, Rfl: 2   hydroxychloroquine (PLAQUENIL) 200 MG tablet, Take 1 tablet (200 mg total) by mouth daily., Disp: 90 tablet, Rfl: 0   MELATONIN PO, Take by mouth as needed., Disp: , Rfl:    Methotrexate, PF, (OTREXUP) 25 MG/0.4ML SOAJ, Inject 25 mg into the skin once a week., Disp:  1.6 mL, Rfl: 2   polyethylene glycol (MIRALAX / GLYCOLAX) 17 g packet, Take 17 g by mouth daily as needed., Disp: 30 packet, Rfl: 0   promethazine-dextromethorphan (PROMETHAZINE-DM) 6.25-15 MG/5ML syrup, Take 5 mLs by mouth 4 (four) times daily as needed for cough., Disp: 118 mL, Rfl: 0   tiZANidine (ZANAFLEX) 4 MG tablet, TAKE 1 TABLET BY MOUTH EVERY 6 HOURS AS NEEDED FOR MUSCLE SPASMS., Disp: 30 tablet, Rfl: 0   TUBERCULIN SYR 1CC/27GX1/2" 27G X 1/2" 1 ML MISC, Use 1 syringe once weekly to inject methotrexate. (Patient not taking: Reported on 09/28/2022), Disp: 12 each, Rfl: 2   Allergies  Allergen Reactions   Penicillins Anaphylaxis    Past Medical History:  Diagnosis Date   Allergy    SEASONAL   Anemia    Arthritis    Back pain    Diverticulitis    Fatty liver    per patient   GERD (gastroesophageal reflux disease)    Insomnia    Joint pain    NAFLD (nonalcoholic fatty liver disease)    Rheumatoid arthritis (HCC)      Past Surgical History:  Procedure Laterality Date   ABDOMINAL HYSTERECTOMY     ANKLE SURGERY     COLONOSCOPY  01/18/2020   UPPER GI ENDOSCOPY      Family History  Problem Relation Age of Onset   Lupus Mother    Fibromyalgia Mother    Hyperlipidemia Mother    Hypertension Father  High Cholesterol Father    Hyperlipidemia Father    Colon polyps Father    Kidney Stones Brother    Healthy Son    Hypertension Daughter    Migraines Daughter    Colon cancer Neg Hx    Esophageal cancer Neg Hx    Rectal cancer Neg Hx    Stomach cancer Neg Hx     Social History   Tobacco Use   Smoking status: Never    Passive exposure: Past   Smokeless tobacco: Never  Vaping Use   Vaping status: Never Used  Substance Use Topics   Alcohol use: Yes    Comment: rarely    Drug use: Never    ROS   Objective:   Vitals: BP 132/76 (BP Location: Right Arm)   Pulse 78   Temp 98.3 F (36.8 C) (Oral)   Resp 20   SpO2 97%   Physical Exam Constitutional:       General: She is not in acute distress.    Appearance: Normal appearance. She is well-developed and normal weight. She is not ill-appearing, toxic-appearing or diaphoretic.  HENT:     Head: Normocephalic and atraumatic.     Right Ear: Tympanic membrane, ear canal and external ear normal. No drainage or tenderness. No middle ear effusion. There is no impacted cerumen. Tympanic membrane is not erythematous or bulging.     Left Ear: Tympanic membrane, ear canal and external ear normal. No drainage or tenderness.  No middle ear effusion. There is no impacted cerumen. Tympanic membrane is not erythematous or bulging.     Nose: Congestion and rhinorrhea present.     Mouth/Throat:     Mouth: Mucous membranes are moist. No oral lesions.     Pharynx: No pharyngeal swelling, oropharyngeal exudate, posterior oropharyngeal erythema or uvula swelling.     Tonsils: No tonsillar exudate or tonsillar abscesses.  Eyes:     General: No scleral icterus.       Right eye: No discharge.        Left eye: No discharge.     Extraocular Movements: Extraocular movements intact.     Right eye: Normal extraocular motion.     Left eye: Normal extraocular motion.     Conjunctiva/sclera: Conjunctivae normal.  Cardiovascular:     Rate and Rhythm: Normal rate and regular rhythm.     Heart sounds: Normal heart sounds. No murmur heard.    No friction rub. No gallop.  Pulmonary:     Effort: Pulmonary effort is normal. No respiratory distress.     Breath sounds: No stridor. Rhonchi present. No wheezing or rales.  Chest:     Chest wall: No tenderness.  Musculoskeletal:     Cervical back: Normal range of motion and neck supple.  Lymphadenopathy:     Cervical: No cervical adenopathy.  Skin:    General: Skin is warm and dry.  Neurological:     General: No focal deficit present.     Mental Status: She is alert and oriented to person, place, and time.  Psychiatric:        Mood and Affect: Mood normal.         Behavior: Behavior normal.     Assessment and Plan :   PDMP not reviewed this encounter.  1. Sinobronchitis   2. Productive cough    Recommended starting cefdinir and prednisone to cover for sinobronchitis.  Use supportive care otherwise.  X-ray over-read was pending at time of discharge, recommended follow up  with only abnormal results. Otherwise will not call for negative over-read. Patient was in agreement. Counseled patient on potential for adverse effects with medications prescribed/recommended today, ER and return-to-clinic precautions discussed, patient verbalized understanding.    Wallis Bamberg, [place] 02/20/23 6045  UPDATE: DG Chest 2 View Result Date: 02/20/2023 CLINICAL DATA:  Persistent cough. EXAM: CHEST - 2 VIEW COMPARISON:  10/06/2021 FINDINGS: Normal heart size. No pleural fluid or interstitial edema. Diffuse central bronchial scratch set diffuse airway thickening is noted bilaterally. Bandlike opacity on the lateral projection radiograph in the expected location of the right middle lobe, new from previous exam. Visualized osseous structures appear intact. IMPRESSION: 1. Diffuse central bronchial wall thickening and airway thickening compatible with bronchitis. 2. Bandlike opacity in the expected location of the right middle lobe may reflect atelectasis or developing pneumonia. Electronically Signed   By: Signa Kell M.D.   On: 02/20/2023 08:40   1. Sinobronchitis   2. Productive cough   3. Pneumonia of right middle lobe due to infectious organism     Sinobronchitis confirmed but will also be adding azithromycin to the regimen to cover for potential developing right middle lobe pneumonia.  Patient is aware.   Wallis Bamberg, PA-C 02/20/23 1122

## 2023-02-20 NOTE — ED Triage Notes (Signed)
Pt c/o prod cough, head/chest congestion, sore throat x 8 days-denies fever-NAD-steady gait

## 2023-02-23 DIAGNOSIS — M5030 Other cervical disc degeneration, unspecified cervical region: Secondary | ICD-10-CM | POA: Insufficient documentation

## 2023-02-23 DIAGNOSIS — M51369 Other intervertebral disc degeneration, lumbar region without mention of lumbar back pain or lower extremity pain: Secondary | ICD-10-CM | POA: Insufficient documentation

## 2023-02-23 DIAGNOSIS — M170 Bilateral primary osteoarthritis of knee: Secondary | ICD-10-CM | POA: Insufficient documentation

## 2023-02-23 NOTE — Progress Notes (Deleted)
 Office Visit Note  Patient: Brittany Malone             Date of Birth: [birth year]           MRN: [id number]             PCP: Sharlene Dory, DO Referring: Sharlene Dory* Visit Date: 03/09/2023 Occupation: @GUAROCC @  Subjective:  No chief complaint on file.   History of Present Illness: Brittany Malone is a 57 y.o. female ***  Discussed risk continuing OTREXUP as patient has history of fatty liver ultrasound June 23, 2020.  Discussed with Dr. Barron Alvine   Activities of Daily Living:  Patient reports morning stiffness for *** {minute/hour:19697}.   Patient {ACTIONS;DENIES/REPORTS:21021675::"Denies"} nocturnal pain.  Difficulty dressing/grooming: {ACTIONS;DENIES/REPORTS:21021675::"Denies"} Difficulty climbing stairs: {ACTIONS;DENIES/REPORTS:21021675::"Denies"} Difficulty getting out of chair: {ACTIONS;DENIES/REPORTS:21021675::"Denies"} Difficulty using hands for taps, buttons, cutlery, and/or writing: {ACTIONS;DENIES/REPORTS:21021675::"Denies"}  No Rheumatology ROS completed.   PMFS History:  Patient Active Problem List   Diagnosis Date Noted   Rheumatoid arthritis (HCC)    NAFLD (nonalcoholic fatty liver disease)    GERD (gastroesophageal reflux disease)    Rheumatoid arthritis involving multiple sites with positive rheumatoid factor (HCC) 08/15/2018   Family history of rheumatoid arthritis 08/15/2018   Chronic uveitis of both eyes 08/15/2018   Pes cavus 08/15/2018   History of gastroesophageal reflux (GERD) 08/15/2018    Past Medical History:  Diagnosis Date   Allergy    SEASONAL   Anemia    Arthritis    Back pain    Diverticulitis    Fatty liver    per patient   GERD (gastroesophageal reflux disease)    Insomnia    Joint pain    NAFLD (nonalcoholic fatty liver disease)    Rheumatoid arthritis (HCC)     Family History  Problem Relation Age of Onset   Lupus Mother    Fibromyalgia Mother    Hyperlipidemia Mother    Hypertension Father     High Cholesterol Father    Hyperlipidemia Father    Colon polyps Father    Kidney Stones Brother    Healthy Son    Hypertension Daughter    Migraines Daughter    Colon cancer Neg Hx    Esophageal cancer Neg Hx    Rectal cancer Neg Hx    Stomach cancer Neg Hx    Past Surgical History:  Procedure Laterality Date   ABDOMINAL HYSTERECTOMY     ANKLE SURGERY     COLONOSCOPY  01/18/2020   UPPER GI ENDOSCOPY     Social History   Social History Narrative   Not on file   Immunization History  Administered Date(s) Administered   Influenza,inj,Quad PF,6+ Mos 11/16/2018   Influenza-Unspecified 01/13/2022   Moderna Sars-Covid-2 Vaccination 03/08/2019, 04/05/2019, 11/23/2019, 06/02/2020, 03/21/2021   Pfizer(Comirnaty)Fall Seasonal Vaccine 12 years and older 01/13/2022   Pneumococcal Polysaccharide-23 11/16/2018   Tdap 05/16/2010, 06/16/2020   Zoster Recombinant(Shingrix) 11/16/2018, 01/16/2019     Objective: Vital Signs: There were no vitals taken for this visit.   Physical Exam   Musculoskeletal Exam: ***  CDAI Exam: CDAI Score: -- Patient Global: --; Provider Global: -- Swollen: --; Tender: -- Joint Exam 03/09/2023   No joint exam has been documented for this visit   There is currently no information documented on the homunculus. Go to the Rheumatology activity and complete the homunculus joint exam.  Investigation: No additional findings.  Imaging: DG Chest 2 View Result Date: 02/20/2023 CLINICAL DATA:  Persistent cough. EXAM: CHEST -  2 VIEW COMPARISON:  10/06/2021 FINDINGS: Normal heart size. No pleural fluid or interstitial edema. Diffuse central bronchial scratch set diffuse airway thickening is noted bilaterally. Bandlike opacity on the lateral projection radiograph in the expected location of the right middle lobe, new from previous exam. Visualized osseous structures appear intact. IMPRESSION: 1. Diffuse central bronchial wall thickening and airway thickening  compatible with bronchitis. 2. Bandlike opacity in the expected location of the right middle lobe may reflect atelectasis or developing pneumonia. Electronically Signed   By: Signa Kell M.D.   On: 02/20/2023 08:40    Recent Labs: Lab Results  Component Value Date   WBC 5.6 09/28/2022   HGB 13.8 09/28/2022   PLT 202 09/28/2022   NA 140 09/28/2022   K 4.3 09/28/2022   CL 105 09/28/2022   CO2 29 09/28/2022   GLUCOSE 100 (H) 09/28/2022   BUN 18 09/28/2022   CREATININE 0.66 09/28/2022   BILITOT 0.5 09/28/2022   ALKPHOS 75 05/12/2022   AST 18 09/28/2022   ALT 24 09/28/2022   PROT 6.8 09/28/2022   ALBUMIN 3.9 05/12/2022   CALCIUM 10.0 09/28/2022   GFRAA 114 08/26/2020   QFTBGOLDPLUS NEGATIVE 11/20/2019    Speciality Comments: PLQ Eye Exam: 06/01/2021 WNL with Pre-existing Toxo Scars @ [facility] Follow up  09/2022 per patient.  Procedures:  No procedures performed Allergies: Penicillins   Assessment / Plan:     Visit Diagnoses: No diagnosis found.  Orders: No orders of the defined types were placed in this encounter.  No orders of the defined types were placed in this encounter.   Face-to-face time spent with patient was *** minutes. Greater than 50% of time was spent in counseling and coordination of care.  Follow-Up Instructions: No follow-ups on file.   Pollyann Savoy, MD  Note - This record has been created using Animal nutritionist.  Chart creation errors have been sought, but may not always  have been located. Such creation errors do not reflect on  the standard of medical care.

## 2023-02-27 ENCOUNTER — Other Ambulatory Visit: Payer: Self-pay | Admitting: Physician Assistant

## 2023-02-27 DIAGNOSIS — M0579 Rheumatoid arthritis with rheumatoid factor of multiple sites without organ or systems involvement: Secondary | ICD-10-CM

## 2023-02-28 ENCOUNTER — Other Ambulatory Visit: Payer: Self-pay

## 2023-02-28 ENCOUNTER — Other Ambulatory Visit (HOSPITAL_BASED_OUTPATIENT_CLINIC_OR_DEPARTMENT_OTHER): Payer: Self-pay

## 2023-02-28 ENCOUNTER — Encounter: Payer: Self-pay | Admitting: Rheumatology

## 2023-02-28 DIAGNOSIS — Z79899 Other long term (current) drug therapy: Secondary | ICD-10-CM

## 2023-02-28 MED ORDER — HYDROXYCHLOROQUINE SULFATE 200 MG PO TABS
200.0000 mg | ORAL_TABLET | Freq: Every day | ORAL | 0 refills | Status: DC
Start: 2023-02-28 — End: 2023-06-27
  Filled 2023-02-28: qty 30, 30d supply, fill #0

## 2023-02-28 NOTE — Telephone Encounter (Signed)
Last Fill: 09/28/2022  Eye exam: 06/01/2021 WNL    Labs: 09/28/2022 CBC and CMP WNL   Next Visit: 03/09/2023  Last Visit: 09/28/2022  QQ:VZDGLOVFIE arthritis involving multiple sites with positive rheumatoid factor   Current Dose per office note 09/28/2022: plaquenil 200 mg 1 tablet by mouth daily   Left message to advise patient she is due to update her PLQ eye exam.   Okay to refill Plaquenil?

## 2023-03-01 ENCOUNTER — Other Ambulatory Visit: Payer: Self-pay

## 2023-03-03 ENCOUNTER — Other Ambulatory Visit: Payer: Self-pay

## 2023-03-04 ENCOUNTER — Ambulatory Visit
Admission: RE | Admit: 2023-03-04 | Discharge: 2023-03-04 | Disposition: A | Discharge status: Home / Self Care | Payer: BC Managed Care – PPO | Source: Ambulatory Visit | Attending: Family Medicine

## 2023-03-04 ENCOUNTER — Ambulatory Visit (INDEPENDENT_AMBULATORY_CARE_PROVIDER_SITE_OTHER): Payer: BC Managed Care – PPO

## 2023-03-04 VITALS — BP 136/85 | HR 80 | Temp 98.3°F | Resp 20

## 2023-03-04 DIAGNOSIS — R053 Chronic cough: Secondary | ICD-10-CM

## 2023-03-04 DIAGNOSIS — R0602 Shortness of breath: Secondary | ICD-10-CM | POA: Diagnosis not present

## 2023-03-04 MED ORDER — PREDNISONE 50 MG PO TABS: 50.0000 mg | ORAL_TABLET | Freq: Every day | ORAL | 0 refills | Status: DC

## 2023-03-04 NOTE — ED Triage Notes (Signed)
 Pt c/o cough x 3 weeks-states she was seen 12/22-completed rx abx-states felt worse x 4 days-NAD-steady gait

## 2023-03-04 NOTE — ED Provider Notes (Signed)
 [facility]  Note:  This document was prepared using Conservation officer, historic buildings and may include unintentional dictation errors.  MRN: [id number] DOB: 1965/06/26  Subjective:   Brittany Malone is a 58 y.o. female presenting for recheck on persistent coughing.  Patient was seen at our clinic 02/20/2023.  Was treated for sinobronchitis with cefdinir , azithromycin  and prednisone .  X-ray confirmed this was appropriate treatment.  She had improvement but once the antibiotics finished her symptoms returned.  No current facility-administered medications for this encounter.  Current Outpatient Medications:    acetaminophen  (TYLENOL ) 325 MG tablet, Take 650 mg by mouth every 6 (six) hours as needed., Disp: , Rfl:    albuterol  (VENTOLIN  HFA) 108 (90 Base) MCG/ACT inhaler, Inhale 2 puffs into the lungs every 6 (six) hours as needed for wheezing or shortness of breath., Disp: 8 g, Rfl: 0   amitriptyline  (ELAVIL ) 50 MG tablet, TAKE 1 TABLET BY MOUTH AT BEDTIME AS NEEDED FOR SLEEP, Disp: 90 tablet, Rfl: 2   azithromycin  (ZITHROMAX ) 250 MG tablet, Day 1: take 2 tablets. Day 2-5: Take 1 tablet daily., Disp: 6 tablet, Rfl: 0   cefdinir  (OMNICEF ) 300 MG capsule, Take 1 capsule (300 mg total) by mouth 2 (two) times daily., Disp: 20 capsule, Rfl: 0   cephALEXin  (KEFLEX ) 500 MG capsule, Take 2 capsules (1000mg  ) for first dose then Take 1 capsule (500 mg total) by mouth every 6 (six) hours until gone, Disp: 28 capsule, Rfl: 0   ferrous sulfate 325 (65 FE) MG tablet, Take by mouth. (Patient not taking: Reported on 09/28/2022), Disp: , Rfl:    fluticasone (FLONASE) 50 MCG/ACT nasal spray, Place into both nostrils as needed for allergies or rhinitis., Disp: , Rfl:    folic acid  (FOLVITE ) 1 MG tablet, Take 2 tablets (2 mg total) by mouth daily., Disp: 180 tablet, Rfl: 2   hydroxychloroquine  (PLAQUENIL ) 200 MG tablet, Take 1 tablet (200 mg total) by mouth daily., Disp: 30 tablet, Rfl: 0    MELATONIN PO, Take by mouth as needed., Disp: , Rfl:    Methotrexate , PF, (OTREXUP ) 25 MG/0.4ML SOAJ, Inject 25 mg into the skin once a week., Disp: 1.6 mL, Rfl: 2   polyethylene glycol (MIRALAX  / GLYCOLAX ) 17 g packet, Take 17 g by mouth daily as needed., Disp: 30 packet, Rfl: 0   predniSONE  (DELTASONE ) 20 MG tablet, Take 2 tablets daily with breakfast., Disp: 10 tablet, Rfl: 0   promethazine -dextromethorphan (PROMETHAZINE -DM) 6.25-15 MG/5ML syrup, Take 5 mLs by mouth 3 (three) times daily as needed for cough., Disp: 200 mL, Rfl: 0   tiZANidine  (ZANAFLEX ) 4 MG tablet, TAKE 1 TABLET BY MOUTH EVERY 6 HOURS AS NEEDED FOR MUSCLE SPASMS., Disp: 30 tablet, Rfl: 0   TUBERCULIN SYR 1CC/27GX1/2 27G X 1/2 1 ML MISC, Use 1 syringe once weekly to inject methotrexate . (Patient not taking: Reported on 09/28/2022), Disp: 12 each, Rfl: 2   Allergies  Allergen Reactions   Penicillins Anaphylaxis    Past Medical History:  Diagnosis Date   Allergy     SEASONAL   Anemia    Arthritis    Back pain    Diverticulitis    Fatty liver    per patient   GERD (gastroesophageal reflux disease)    Insomnia    Joint pain    NAFLD (nonalcoholic fatty liver disease)    Rheumatoid arthritis (HCC)      Past Surgical History:  Procedure Laterality Date   ABDOMINAL HYSTERECTOMY  ANKLE SURGERY     COLONOSCOPY  01/18/2020   UPPER GI ENDOSCOPY      Family History  Problem Relation Age of Onset   Lupus Mother    Fibromyalgia Mother    Hyperlipidemia Mother    Hypertension Father    High Cholesterol Father    Hyperlipidemia Father    Colon polyps Father    Kidney Stones Brother    Healthy Son    Hypertension Daughter    Migraines Daughter    Colon cancer Neg Hx    Esophageal cancer Neg Hx    Rectal cancer Neg Hx    Stomach cancer Neg Hx     Social History   Tobacco Use   Smoking status: Never    Passive exposure: Past   Smokeless tobacco: Never  Vaping Use   Vaping status: Never Used   Substance Use Topics   Alcohol use: Yes    Comment: rarely    Drug use: Never    ROS   Objective:   Vitals: BP 136/85 (BP Location: Left Arm)   Pulse 80   Temp 98.3 F (36.8 C) (Oral)   Resp 20   SpO2 93%   Physical Exam Constitutional:      General: She is not in acute distress.    Appearance: Normal appearance. She is well-developed and normal weight. She is not ill-appearing, toxic-appearing or diaphoretic.  HENT:     Head: Normocephalic and atraumatic.     Right Ear: Tympanic membrane, ear canal and external ear normal. No drainage or tenderness. No middle ear effusion. There is no impacted cerumen. Tympanic membrane is not erythematous or bulging.     Left Ear: Tympanic membrane, ear canal and external ear normal. No drainage or tenderness.  No middle ear effusion. There is no impacted cerumen. Tympanic membrane is not erythematous or bulging.     Nose: Nose normal. No congestion or rhinorrhea.     Mouth/Throat:     Mouth: Mucous membranes are moist. No oral lesions.     Pharynx: No pharyngeal swelling, oropharyngeal exudate, posterior oropharyngeal erythema or uvula swelling.     Tonsils: No tonsillar exudate or tonsillar abscesses.  Eyes:     General: No scleral icterus.       Right eye: No discharge.        Left eye: No discharge.     Extraocular Movements: Extraocular movements intact.     Right eye: Normal extraocular motion.     Left eye: Normal extraocular motion.     Conjunctiva/sclera: Conjunctivae normal.  Cardiovascular:     Rate and Rhythm: Normal rate and regular rhythm.     Heart sounds: Normal heart sounds. No murmur heard.    No friction rub. No gallop.  Pulmonary:     Effort: Pulmonary effort is normal. No respiratory distress.     Breath sounds: No stridor. No wheezing, rhonchi or rales.  Chest:     Chest wall: No tenderness.  Musculoskeletal:     Cervical back: Normal range of motion and neck supple.  Lymphadenopathy:     Cervical: No  cervical adenopathy.  Skin:    General: Skin is warm and dry.  Neurological:     General: No focal deficit present.     Mental Status: She is alert and oriented to person, place, and time.  Psychiatric:        Mood and Affect: Mood normal.        Behavior: Behavior normal.  DG Chest 2 View Result Date: 03/04/2023 CLINICAL DATA:  Persistent cough and shortness of breath EXAM: CHEST - 2 VIEW COMPARISON:  Chest x-ray 02/20/2023 FINDINGS: The heart size and mediastinal contours are within normal limits. Both lungs are clear. The visualized skeletal structures are unremarkable. IMPRESSION: No active cardiopulmonary disease. Electronically Signed   By: Greig Pique M.D.   On: 03/04/2023 15:47     Assessment and Plan :   PDMP not reviewed this encounter.  1. Persistent cough    Offered patient 1 more round of prednisone  at 50 mg for 5 days.  Will defer further antibiotic use.  Counseled patient on potential for adverse effects with medications prescribed/recommended today, ER and return-to-clinic precautions discussed, patient verbalized understanding.    Christopher Savannah, [place] 03/04/23 [id number]

## 2023-03-05 ENCOUNTER — Other Ambulatory Visit: Payer: Self-pay | Admitting: Medical Genetics

## 2023-03-07 ENCOUNTER — Other Ambulatory Visit: Payer: Self-pay

## 2023-03-09 ENCOUNTER — Ambulatory Visit: Payer: BC Managed Care – PPO | Admitting: Rheumatology

## 2023-03-09 ENCOUNTER — Encounter (HOSPITAL_COMMUNITY): Payer: Self-pay

## 2023-03-09 ENCOUNTER — Other Ambulatory Visit (HOSPITAL_COMMUNITY): Payer: Self-pay

## 2023-03-09 DIAGNOSIS — K760 Fatty (change of) liver, not elsewhere classified: Secondary | ICD-10-CM

## 2023-03-09 DIAGNOSIS — G8929 Other chronic pain: Secondary | ICD-10-CM

## 2023-03-09 DIAGNOSIS — Z85820 Personal history of malignant melanoma of skin: Secondary | ICD-10-CM

## 2023-03-09 DIAGNOSIS — M0579 Rheumatoid arthritis with rheumatoid factor of multiple sites without organ or systems involvement: Secondary | ICD-10-CM

## 2023-03-09 DIAGNOSIS — Z79899 Other long term (current) drug therapy: Secondary | ICD-10-CM

## 2023-03-09 DIAGNOSIS — M170 Bilateral primary osteoarthritis of knee: Secondary | ICD-10-CM

## 2023-03-09 DIAGNOSIS — Q667 Congenital pes cavus, unspecified foot: Secondary | ICD-10-CM

## 2023-03-09 DIAGNOSIS — M5030 Other cervical disc degeneration, unspecified cervical region: Secondary | ICD-10-CM

## 2023-03-09 DIAGNOSIS — Z8719 Personal history of other diseases of the digestive system: Secondary | ICD-10-CM

## 2023-03-09 DIAGNOSIS — H2013 Chronic iridocyclitis, bilateral: Secondary | ICD-10-CM

## 2023-03-09 DIAGNOSIS — K219 Gastro-esophageal reflux disease without esophagitis: Secondary | ICD-10-CM

## 2023-03-09 DIAGNOSIS — M51369 Other intervertebral disc degeneration, lumbar region without mention of lumbar back pain or lower extremity pain: Secondary | ICD-10-CM

## 2023-03-09 DIAGNOSIS — Z8269 Family history of other diseases of the musculoskeletal system and connective tissue: Secondary | ICD-10-CM

## 2023-03-12 ENCOUNTER — Other Ambulatory Visit (HOSPITAL_COMMUNITY): Payer: Self-pay

## 2023-03-14 ENCOUNTER — Other Ambulatory Visit: Payer: Self-pay

## 2023-03-14 NOTE — Progress Notes (Deleted)
 Office Visit Note  Patient: Brittany Malone             Date of Birth: 17-Aug-1965           MRN: [id number]             PCP: Sharlene Dory, DO Referring: Sharlene Dory* Visit Date: 03/25/2023 Occupation: @GUAROCC @  Subjective:  No chief complaint on file.   History of Present Illness: Brittany Malone is a 58 y.o. female ***  Discussed risk continuing OTREXUP as patient has history of fatty liver ultrasound June 23, 2020.  Discussed with Dr. Barron Alvine   Activities of Daily Living:  Patient reports morning stiffness for *** {minute/hour:19697}.   Patient {ACTIONS;DENIES/REPORTS:21021675::"Denies"} nocturnal pain.  Difficulty dressing/grooming: {ACTIONS;DENIES/REPORTS:21021675::"Denies"} Difficulty climbing stairs: {ACTIONS;DENIES/REPORTS:21021675::"Denies"} Difficulty getting out of chair: {ACTIONS;DENIES/REPORTS:21021675::"Denies"} Difficulty using hands for taps, buttons, cutlery, and/or writing: {ACTIONS;DENIES/REPORTS:21021675::"Denies"}  No Rheumatology ROS completed.   PMFS History:  Patient Active Problem List   Diagnosis Date Noted  . Primary osteoarthritis of both knees 02/23/2023  . DDD (degenerative disc disease), cervical 02/23/2023  . Degeneration of intervertebral disc of lumbar region without discogenic back pain or lower extremity pain 02/23/2023  . NAFLD (nonalcoholic fatty liver disease)   . GERD (gastroesophageal reflux disease)   . Rheumatoid arthritis involving multiple sites with positive rheumatoid factor (HCC) 08/15/2018  . Family history of rheumatoid arthritis 08/15/2018  . Chronic uveitis of both eyes 08/15/2018  . Pes cavus 08/15/2018  . History of gastroesophageal reflux (GERD) 08/15/2018    Past Medical History:  Diagnosis Date  . Allergy    SEASONAL  . Anemia   . Arthritis   . Back pain   . Diverticulitis   . Fatty liver    per patient  . GERD (gastroesophageal reflux disease)   . Insomnia   . Joint pain   .  NAFLD (nonalcoholic fatty liver disease)   . Rheumatoid arthritis (HCC)     Family History  Problem Relation Age of Onset  . Lupus Mother   . Fibromyalgia Mother   . Hyperlipidemia Mother   . Hypertension Father   . High Cholesterol Father   . Hyperlipidemia Father   . Colon polyps Father   . Kidney Stones Brother   . Healthy Son   . Hypertension Daughter   . Migraines Daughter   . Colon cancer Neg Hx   . Esophageal cancer Neg Hx   . Rectal cancer Neg Hx   . Stomach cancer Neg Hx    Past Surgical History:  Procedure Laterality Date  . ABDOMINAL HYSTERECTOMY    . ANKLE SURGERY    . COLONOSCOPY  01/18/2020  . UPPER GI ENDOSCOPY     Social History   Social History Narrative  . Not on file   Immunization History  Administered Date(s) Administered  . Influenza,inj,Quad PF,6+ Mos 11/16/2018  . Influenza-Unspecified 01/13/2022  . Moderna Sars-Covid-2 Vaccination 03/08/2019, 04/05/2019, 11/23/2019, 06/02/2020, 03/21/2021  . Pfizer(Comirnaty)Fall Seasonal Vaccine 12 years and older 01/13/2022  . Pneumococcal Polysaccharide-23 11/16/2018  . Tdap 05/16/2010, 06/16/2020  . Zoster Recombinant(Shingrix) 11/16/2018, 01/16/2019     Objective: Vital Signs: There were no vitals taken for this visit.   Physical Exam   Musculoskeletal Exam: ***  CDAI Exam: CDAI Score: -- Patient Global: --; Provider Global: -- Swollen: --; Tender: -- Joint Exam 03/25/2023   No joint exam has been documented for this visit   There is currently no information documented on the homunculus. Go to the Rheumatology activity  and complete the homunculus joint exam.  Investigation: No additional findings.  Imaging: DG Chest 2 View Result Date: 03/04/2023 CLINICAL DATA:  Persistent cough and shortness of breath EXAM: CHEST - 2 VIEW COMPARISON:  Chest x-ray 02/20/2023 FINDINGS: The heart size and mediastinal contours are within normal limits. Both lungs are clear. The visualized skeletal structures  are unremarkable. IMPRESSION: No active cardiopulmonary disease. Electronically Signed   By: Darliss Cheney M.D.   On: 03/04/2023 15:47   DG Chest 2 View Result Date: 02/20/2023 CLINICAL DATA:  Persistent cough. EXAM: CHEST - 2 VIEW COMPARISON:  10/06/2021 FINDINGS: Normal heart size. No pleural fluid or interstitial edema. Diffuse central bronchial scratch set diffuse airway thickening is noted bilaterally. Bandlike opacity on the lateral projection radiograph in the expected location of the right middle lobe, new from previous exam. Visualized osseous structures appear intact. IMPRESSION: 1. Diffuse central bronchial wall thickening and airway thickening compatible with bronchitis. 2. Bandlike opacity in the expected location of the right middle lobe may reflect atelectasis or developing pneumonia. Electronically Signed   By: Signa Kell M.D.   On: 02/20/2023 08:40    Recent Labs: Lab Results  Component Value Date   WBC 5.6 09/28/2022   HGB 13.8 09/28/2022   PLT 202 09/28/2022   NA 140 09/28/2022   K 4.3 09/28/2022   CL 105 09/28/2022   CO2 29 09/28/2022   GLUCOSE 100 (H) 09/28/2022   BUN 18 09/28/2022   CREATININE 0.66 09/28/2022   BILITOT 0.5 09/28/2022   ALKPHOS 75 05/12/2022   AST 18 09/28/2022   ALT 24 09/28/2022   PROT 6.8 09/28/2022   ALBUMIN 3.9 05/12/2022   CALCIUM 10.0 09/28/2022   GFRAA 114 08/26/2020   QFTBGOLDPLUS NEGATIVE 11/20/2019    Speciality Comments: PLQ Eye Exam: 10/19/2022 WNL with Pre-existing Toxo Scars @ [facility] Follow up 1 year  Procedures:  No procedures performed Allergies: Penicillins   Assessment / Plan:     Visit Diagnoses: Rheumatoid arthritis involving multiple sites with positive rheumatoid factor (HCC)  High risk medication use  Chronic uveitis of both eyes  Primary osteoarthritis of both knees  Chronic SI joint pain  DDD (degenerative disc disease), cervical  Pes cavus  Degeneration of intervertebral disc  of lumbar region without discogenic back pain or lower extremity pain  Family history of systemic lupus erythematosus (SLE) in mother  History of melanoma  History of gastroesophageal reflux (GERD)  History of diverticulosis  COVID-19 virus infection  Orders: No orders of the defined types were placed in this encounter.  No orders of the defined types were placed in this encounter.   Face-to-face time spent with patient was *** minutes. Greater than 50% of time was spent in counseling and coordination of care.  Follow-Up Instructions: No follow-ups on file.   Gearldine Bienenstock, PA-C  Note - This record has been created using Dragon software.  Chart creation errors have been sought, but may not always  have been located. Such creation errors do not reflect on  the standard of medical care.

## 2023-03-17 DIAGNOSIS — Z79899 Other long term (current) drug therapy: Secondary | ICD-10-CM | POA: Diagnosis not present

## 2023-03-18 LAB — COMPLETE METABOLIC PANEL WITH GFR
AG Ratio: 1.6 (calc) (ref 1.0–2.5)
ALT: 22 U/L (ref 6–29)
AST: 15 U/L (ref 10–35)
Albumin: 3.9 g/dL (ref 3.6–5.1)
Alkaline phosphatase (APISO): 72 U/L (ref 37–153)
BUN: 15 mg/dL (ref 7–25)
CO2: 28 mmol/L (ref 20–32)
Calcium: 9.7 mg/dL (ref 8.6–10.4)
Chloride: 107 mmol/L (ref 98–110)
Creat: 0.58 mg/dL (ref 0.50–1.03)
Globulin: 2.5 g/dL (ref 1.9–3.7)
Glucose, Bld: 108 mg/dL — ABNORMAL HIGH (ref 65–99)
Potassium: 4.3 mmol/L (ref 3.5–5.3)
Sodium: 142 mmol/L (ref 135–146)
Total Bilirubin: 0.5 mg/dL (ref 0.2–1.2)
Total Protein: 6.4 g/dL (ref 6.1–8.1)
eGFR: 104 mL/min/{1.73_m2} (ref >=60)

## 2023-03-18 LAB — CBC WITH DIFFERENTIAL/PLATELET
Absolute Lymphocytes: 1131 {cells}/uL (ref 850–3900)
Absolute Monocytes: 579 {cells}/uL (ref 200–950)
Basophils Absolute: 20 {cells}/uL (ref 0–200)
Basophils Relative: 0.3 %
Eosinophils Absolute: 208 {cells}/uL (ref 15–500)
Eosinophils Relative: 3.2 %
HCT: 40.2 % (ref 35.0–45.0)
Hemoglobin: 13.5 g/dL (ref 11.7–15.5)
MCH: 29.7 pg (ref 27.0–33.0)
MCHC: 33.6 g/dL (ref 32.0–36.0)
MCV: 88.5 fL (ref 80.0–100.0)
MPV: 9.9 fL (ref 7.5–12.5)
Monocytes Relative: 8.9 %
Neutro Abs: 4563 {cells}/uL (ref 1500–7800)
Neutrophils Relative %: 70.2 %
Platelets: 209 10*3/uL (ref 140–400)
RBC: 4.54 10*6/uL (ref 3.80–5.10)
RDW: 12.7 % (ref 11.0–15.0)
Total Lymphocyte: 17.4 %
WBC: 6.5 10*3/uL (ref 3.8–10.8)

## 2023-03-18 NOTE — Progress Notes (Signed)
BC and CMP are normal.

## 2023-03-23 ENCOUNTER — Encounter: Payer: Self-pay | Admitting: Family Medicine

## 2023-03-23 ENCOUNTER — Ambulatory Visit (INDEPENDENT_AMBULATORY_CARE_PROVIDER_SITE_OTHER): Payer: BC Managed Care – PPO | Admitting: Family Medicine

## 2023-03-23 VITALS — BP 128/76 | HR 73 | Temp 98.0°F | Resp 16 | Ht 67.0 in | Wt 275.0 lb

## 2023-03-23 DIAGNOSIS — J0100 Acute maxillary sinusitis, unspecified: Secondary | ICD-10-CM

## 2023-03-23 DIAGNOSIS — R052 Subacute cough: Secondary | ICD-10-CM

## 2023-03-23 MED ORDER — DOXYCYCLINE HYCLATE 100 MG PO TABS: 100.0000 mg | ORAL_TABLET | Freq: Two times a day (BID) | ORAL | 0 refills | Status: AC

## 2023-03-23 MED ORDER — METHYLPREDNISOLONE ACETATE 80 MG/ML IJ SUSP
80.0000 mg | Freq: Once | INTRAMUSCULAR | Status: AC
  Administered 2023-03-23: 80 mg via INTRAMUSCULAR

## 2023-03-23 NOTE — Progress Notes (Signed)
Chief Complaint  Patient presents with   URI    URI    Brittany Malone here for URI complaints.  Duration: 5 weeks  Associated symptoms: sinus headache, sinus congestion, rhinorrhea, ear pain, sore throat, and coughing Denies: sinus pain, ear drainage, wheezing, shortness of breath, myalgia, and fevers Treatment to date: prednisone burst, cough syrup, Zpak, Omnicef, INCS, saline rinses, humidifier, Claritin, Sudafed Had 2 CXR's, 1st suggestive of infection, 2nd was clear Sick contacts: No  Past Medical History:  Diagnosis Date   Allergy    SEASONAL   Anemia    Arthritis    Back pain    Diverticulitis    Fatty liver    per patient   GERD (gastroesophageal reflux disease)    Insomnia    Joint pain    NAFLD (nonalcoholic fatty liver disease)    Rheumatoid arthritis (HCC)     Objective BP 128/76   Pulse 73   Temp 98 F (36.7 C) (Oral)   Resp 16   Ht 5\' 7"  (1.702 m)   Wt 275 lb (124.7 kg)   SpO2 95%   BMI 43.07 kg/m  General: Awake, alert, appears stated age HEENT: AT, [place], ears patent b/l and TM's neg, nares patent w/o discharge, pharynx pink and without exudates, MMM, TTP over max sinuses b/l Neck: No masses or asymmetry Heart: RRR Lungs: CTAB, no accessory muscle use Psych: Age appropriate judgment and insight, normal mood and affect  Acute maxillary sinusitis, recurrence not specified - Plan: doxycycline (VIBRA-TABS) 100 MG tablet  Subacute cough  Has failed azithromycin and Omnicef.  Needed coverage for MRSA so we will trial doxycycline for 7 days.  She likely has underlying asthma.  Steroid injection today.  Send message in 2 days if no improvement.  If still struggling, we will set her up with an inhaled corticosteroid.  Continue to push fluids, practice good hand hygiene, cover mouth when coughing. F/u prn. If starting to experience fevers, shaking, or shortness of breath, seek immediate care. Pt voiced understanding and agreement to the plan.  [name], DO 03/23/23 12:58 PM

## 2023-03-23 NOTE — Patient Instructions (Signed)
Continue to push fluids, practice good hand hygiene, and cover your mouth if you cough.  If you start having fevers, shaking or shortness of breath, seek immediate care.  OK to take Tylenol 1000 mg (2 extra strength tabs) or 975 mg (3 regular strength tabs) every 6 hours as needed.  Send me a message in 2 days if not starting to noticeably improve.   Let us know if you need anything.

## 2023-03-25 ENCOUNTER — Ambulatory Visit: Payer: BC Managed Care – PPO | Admitting: Physician Assistant

## 2023-03-25 ENCOUNTER — Encounter: Payer: Self-pay | Admitting: Family Medicine

## 2023-03-25 ENCOUNTER — Other Ambulatory Visit: Payer: Self-pay | Admitting: Family Medicine

## 2023-03-25 DIAGNOSIS — U071 COVID-19: Secondary | ICD-10-CM

## 2023-03-25 DIAGNOSIS — M170 Bilateral primary osteoarthritis of knee: Secondary | ICD-10-CM

## 2023-03-25 DIAGNOSIS — M51369 Other intervertebral disc degeneration, lumbar region without mention of lumbar back pain or lower extremity pain: Secondary | ICD-10-CM

## 2023-03-25 DIAGNOSIS — Z79899 Other long term (current) drug therapy: Secondary | ICD-10-CM

## 2023-03-25 DIAGNOSIS — H2013 Chronic iridocyclitis, bilateral: Secondary | ICD-10-CM

## 2023-03-25 DIAGNOSIS — Q667 Congenital pes cavus, unspecified foot: Secondary | ICD-10-CM

## 2023-03-25 DIAGNOSIS — M0579 Rheumatoid arthritis with rheumatoid factor of multiple sites without organ or systems involvement: Secondary | ICD-10-CM

## 2023-03-25 DIAGNOSIS — Z8719 Personal history of other diseases of the digestive system: Secondary | ICD-10-CM

## 2023-03-25 DIAGNOSIS — M5030 Other cervical disc degeneration, unspecified cervical region: Secondary | ICD-10-CM

## 2023-03-25 DIAGNOSIS — Z8269 Family history of other diseases of the musculoskeletal system and connective tissue: Secondary | ICD-10-CM

## 2023-03-25 DIAGNOSIS — G8929 Other chronic pain: Secondary | ICD-10-CM

## 2023-03-25 DIAGNOSIS — Z85820 Personal history of malignant melanoma of skin: Secondary | ICD-10-CM

## 2023-03-25 MED ORDER — QVAR REDIHALER 40 MCG/ACT IN AERB: 2.0000 | INHALATION_SPRAY | Freq: Two times a day (BID) | RESPIRATORY_TRACT | 2 refills | Status: AC

## 2023-04-04 NOTE — Progress Notes (Deleted)
 Office Visit Note  Patient: Brittany Malone             Date of Birth: April 29, 1965           MRN: [id number]             PCP: Sharlene Dory, DO Referring: Sharlene Dory* Visit Date: 04/18/2023 Occupation: @GUAROCC @  Subjective:    History of Present Illness: Brittany Malone is a 58 y.o. female with history of seropositive rheumatoid arthritis.  Patient remains on Otrexup 25 mg sq injections once wly, folic acid 2 mg po daily,plaquenil 200 mg 1 tablet by mouth daily.    Discussed risk continuing OTREXUP as patient has history of fatty liver ultrasound June 23, 2020.  Discussed with Dr. Barron Alvine  CBC and CMP updated on 03/17/23.  Her next lab work will be due in April and every 3 months.    PLQ Eye Exam: 10/19/2022 WNL with Pre-existing Toxo Scars @ [facility] Follow up 1 year   Activities of Daily Living:  Patient reports morning stiffness for *** {minute/hour:19697}.   Patient {ACTIONS;DENIES/REPORTS:21021675::"Denies"} nocturnal pain.  Difficulty dressing/grooming: {ACTIONS;DENIES/REPORTS:21021675::"Denies"} Difficulty climbing stairs: {ACTIONS;DENIES/REPORTS:21021675::"Denies"} Difficulty getting out of chair: {ACTIONS;DENIES/REPORTS:21021675::"Denies"} Difficulty using hands for taps, buttons, cutlery, and/or writing: {ACTIONS;DENIES/REPORTS:21021675::"Denies"}  No Rheumatology ROS completed.   PMFS History:  Patient Active Problem List   Diagnosis Date Noted   Primary osteoarthritis of both knees 02/23/2023   DDD (degenerative disc disease), cervical 02/23/2023   Degeneration of intervertebral disc of lumbar region without discogenic back pain or lower extremity pain 02/23/2023   NAFLD (nonalcoholic fatty liver disease)    GERD (gastroesophageal reflux disease)    Rheumatoid arthritis involving multiple sites with positive rheumatoid factor (HCC) 08/15/2018   Family history of rheumatoid arthritis 08/15/2018   Chronic uveitis of both  eyes 08/15/2018   Pes cavus 08/15/2018   History of gastroesophageal reflux (GERD) 08/15/2018    Past Medical History:  Diagnosis Date   Allergy    SEASONAL   Anemia    Arthritis    Back pain    Diverticulitis    Fatty liver    per patient   GERD (gastroesophageal reflux disease)    Insomnia    Joint pain    NAFLD (nonalcoholic fatty liver disease)    Rheumatoid arthritis (HCC)     Family History  Problem Relation Age of Onset   Lupus Mother    Fibromyalgia Mother    Hyperlipidemia Mother    Hypertension Father    High Cholesterol Father    Hyperlipidemia Father    Colon polyps Father    Kidney Stones Brother    Healthy Son    Hypertension Daughter    Migraines Daughter    Colon cancer Neg Hx    Esophageal cancer Neg Hx    Rectal cancer Neg Hx    Stomach cancer Neg Hx    Past Surgical History:  Procedure Laterality Date   ABDOMINAL HYSTERECTOMY     ANKLE SURGERY     COLONOSCOPY  01/18/2020   UPPER GI ENDOSCOPY     Social History   Social History Narrative   Not on file   Immunization History  Administered Date(s) Administered   Influenza,inj,Quad PF,6+ Mos 11/16/2018   Influenza-Unspecified 01/13/2022, 12/25/2022   Moderna Sars-Covid-2 Vaccination 03/08/2019, 04/05/2019, 11/23/2019, 06/02/2020, 03/21/2021   Pfizer Covid-19 Vaccine Bivalent Booster 5y-11y 12/25/2022   Pfizer(Comirnaty)Fall Seasonal Vaccine 12 years and older 01/13/2022   Pneumococcal Polysaccharide-23 11/16/2018   Tdap 05/16/2010,  06/16/2020   Zoster Recombinant(Shingrix) 11/16/2018, 01/16/2019     Objective: Vital Signs: There were no vitals taken for this visit.   Physical Exam Vitals and nursing note reviewed.  Constitutional:      Appearance: She is well-developed.  HENT:     Head: Normocephalic and atraumatic.  Eyes:     Conjunctiva/sclera: Conjunctivae normal.  Cardiovascular:     Rate and Rhythm: Normal rate and regular rhythm.     Heart sounds: Normal heart sounds.   Pulmonary:     Effort: Pulmonary effort is normal.     Breath sounds: Normal breath sounds.  Abdominal:     General: Bowel sounds are normal.     Palpations: Abdomen is soft.  Musculoskeletal:     Cervical back: Normal range of motion.  Lymphadenopathy:     Cervical: No cervical adenopathy.  Skin:    General: Skin is warm and dry.     Capillary Refill: Capillary refill takes less than 2 seconds.  Neurological:     Mental Status: She is alert and oriented to person, place, and time.  Psychiatric:        Behavior: Behavior normal.      Musculoskeletal Exam: ***  CDAI Exam: CDAI Score: -- Patient Global: --; Provider Global: -- Swollen: --; Tender: -- Joint Exam 04/18/2023   No joint exam has been documented for this visit   There is currently no information documented on the homunculus. Go to the Rheumatology activity and complete the homunculus joint exam.  Investigation: No additional findings.  Imaging: No results found.   Recent Labs: Lab Results  Component Value Date   WBC 6.5 03/17/2023   HGB 13.5 03/17/2023   PLT 209 03/17/2023   NA 142 03/17/2023   K 4.3 03/17/2023   CL 107 03/17/2023   CO2 28 03/17/2023   GLUCOSE 108 (H) 03/17/2023   BUN 15 03/17/2023   CREATININE 0.58 03/17/2023   BILITOT 0.5 03/17/2023   ALKPHOS 75 05/12/2022   AST 15 03/17/2023   ALT 22 03/17/2023   PROT 6.4 03/17/2023   ALBUMIN 3.9 05/12/2022   CALCIUM 9.7 03/17/2023   GFRAA 114 08/26/2020   QFTBGOLDPLUS NEGATIVE 11/20/2019    Speciality Comments: PLQ Eye Exam: 10/19/2022 WNL with Pre-existing Toxo Scars @ [facility] Follow up 1 year  Procedures:  No procedures performed Allergies: Penicillins   Assessment / Plan:     Visit Diagnoses: Rheumatoid arthritis involving multiple sites with positive rheumatoid factor (HCC)  High risk medication use  Chronic uveitis of both eyes  Primary osteoarthritis of both knees  Chronic SI joint pain  Pes  cavus  DDD (degenerative disc disease), cervical  Degeneration of intervertebral disc of lumbar region without discogenic back pain or lower extremity pain  Family history of systemic lupus erythematosus (SLE) in mother  History of melanoma  History of gastroesophageal reflux (GERD)  History of diverticulosis  COVID-19 virus infection  Orders: No orders of the defined types were placed in this encounter.  No orders of the defined types were placed in this encounter.   Face-to-face time spent with patient was *** minutes. Greater than 50% of time was spent in counseling and coordination of care.  Follow-Up Instructions: No follow-ups on file.   Gearldine Bienenstock, PA-C  Note - This record has been created using Dragon software.  Chart creation errors have been sought, but may not always  have been located. Such creation errors do not reflect on  the standard  of medical care.

## 2023-04-05 ENCOUNTER — Other Ambulatory Visit (HOSPITAL_COMMUNITY)
Admission: RE | Admit: 2023-04-05 | Discharge: 2023-04-05 | Disposition: A | Discharge status: Home / Self Care | Payer: Self-pay | Source: Ambulatory Visit | Attending: Medical Genetics | Admitting: Medical Genetics

## 2023-04-15 ENCOUNTER — Other Ambulatory Visit (HOSPITAL_COMMUNITY): Payer: Self-pay

## 2023-04-17 LAB — GENECONNECT MOLECULAR SCREEN: Genetic Analysis Overall Interpretation: NEGATIVE

## 2023-04-18 ENCOUNTER — Ambulatory Visit: Payer: BC Managed Care – PPO | Admitting: Physician Assistant

## 2023-04-18 DIAGNOSIS — M5030 Other cervical disc degeneration, unspecified cervical region: Secondary | ICD-10-CM

## 2023-04-18 DIAGNOSIS — M51369 Other intervertebral disc degeneration, lumbar region without mention of lumbar back pain or lower extremity pain: Secondary | ICD-10-CM

## 2023-04-18 DIAGNOSIS — H2013 Chronic iridocyclitis, bilateral: Secondary | ICD-10-CM

## 2023-04-18 DIAGNOSIS — Z8719 Personal history of other diseases of the digestive system: Secondary | ICD-10-CM

## 2023-04-18 DIAGNOSIS — M170 Bilateral primary osteoarthritis of knee: Secondary | ICD-10-CM

## 2023-04-18 DIAGNOSIS — G8929 Other chronic pain: Secondary | ICD-10-CM

## 2023-04-18 DIAGNOSIS — U071 COVID-19: Secondary | ICD-10-CM

## 2023-04-18 DIAGNOSIS — Z8269 Family history of other diseases of the musculoskeletal system and connective tissue: Secondary | ICD-10-CM

## 2023-04-18 DIAGNOSIS — M0579 Rheumatoid arthritis with rheumatoid factor of multiple sites without organ or systems involvement: Secondary | ICD-10-CM

## 2023-04-18 DIAGNOSIS — Q667 Congenital pes cavus, unspecified foot: Secondary | ICD-10-CM

## 2023-04-18 DIAGNOSIS — Z79899 Other long term (current) drug therapy: Secondary | ICD-10-CM

## 2023-04-18 DIAGNOSIS — Z85820 Personal history of malignant melanoma of skin: Secondary | ICD-10-CM

## 2023-04-19 NOTE — Progress Notes (Unsigned)
Office Visit Note  Patient: Brittany Malone             Date of Birth: 1965/05/22           MRN: [id number]             PCP: Sharlene Dory, DO Referring: Sharlene Dory* Visit Date: 04/20/2023 Occupation: @GUAROCC @  Subjective:  Medication monitoring   History of Present Illness: Laurina Fischl is a 58 y.o. female with history of seropositive rheumatoid arthritis.  Patient is prescribed Otrexup 25 mg sq injections once weekly, folic acid 2 mg po daily, and plaquenil 200 mg 1 tablet by mouth daily.  Patient reports that she was off of Otrexup for about 6 weeks due to recurrent infections including pneumonia and bronchitis.  Patient states that she resumed Otrexup about 2 weeks ago.  She states she also had a small gap in therapy with Plaquenil but is now taking it as prescribed.  Patient states she has occasional aching in her hands and feet but denies any joint swelling.  Patient states that her lower back pain has improved significantly since having an ablation performed in August 2024.  Patient states that she has started to notice some gradual return of symptoms avoid following back up with her back specialist soon.  Patient states she is also scheduled for a yearly physical in March 2025.    Activities of Daily Living:  Patient reports morning stiffness for 15-20 minutes.   Patient Denies nocturnal pain.  Difficulty dressing/grooming: Denies Difficulty climbing stairs: Denies Difficulty getting out of chair: Denies Difficulty using hands for taps, buttons, cutlery, and/or writing: Denies  Review of Systems  Constitutional:  Negative for fatigue.  HENT:  Positive for mouth dryness. Negative for mouth sores and nose dryness.   Eyes:  Negative for pain and dryness.  Respiratory:  Negative for shortness of breath and difficulty breathing.   Cardiovascular:  Negative for chest pain and palpitations.  Gastrointestinal:  Negative for blood in stool, constipation and  diarrhea.  Endocrine: Negative for increased urination.  Genitourinary:  Negative for involuntary urination.  Musculoskeletal:  Positive for joint pain, joint pain and morning stiffness. Negative for gait problem, joint swelling, myalgias, muscle weakness, muscle tenderness and myalgias.  Skin:  Negative for color change, rash, hair loss and sensitivity to sunlight.  Allergic/Immunologic: Negative for susceptible to infections.  Neurological:  Negative for dizziness and headaches.  Hematological:  Negative for swollen glands.  Psychiatric/Behavioral:  Positive for sleep disturbance. Negative for depressed mood. The patient is not nervous/anxious.     PMFS History:  Patient Active Problem List   Diagnosis Date Noted  . Primary osteoarthritis of both knees 02/23/2023  . DDD (degenerative disc disease), cervical 02/23/2023  . Degeneration of intervertebral disc of lumbar region without discogenic back pain or lower extremity pain 02/23/2023  . NAFLD (nonalcoholic fatty liver disease)   . GERD (gastroesophageal reflux disease)   . Rheumatoid arthritis involving multiple sites with positive rheumatoid factor (HCC) 08/15/2018  . Family history of rheumatoid arthritis 08/15/2018  . Chronic uveitis of both eyes 08/15/2018  . Pes cavus 08/15/2018  . History of gastroesophageal reflux (GERD) 08/15/2018    Past Medical History:  Diagnosis Date  . Allergy    SEASONAL  . Anemia   . Arthritis   . Back pain   . Diverticulitis   . Fatty liver    per patient  . GERD (gastroesophageal reflux disease)   . Insomnia   .  Joint pain   . NAFLD (nonalcoholic fatty liver disease)   . Rheumatoid arthritis (HCC)     Family History  Problem Relation Age of Onset  . Lupus Mother   . Fibromyalgia Mother   . Hyperlipidemia Mother   . Hypertension Father   . High Cholesterol Father   . Hyperlipidemia Father   . Colon polyps Father   . Kidney Stones Brother   . Healthy Son   . Hypertension Daughter    . Migraines Daughter   . Colon cancer Neg Hx   . Esophageal cancer Neg Hx   . Rectal cancer Neg Hx   . Stomach cancer Neg Hx    Past Surgical History:  Procedure Laterality Date  . ABDOMINAL HYSTERECTOMY    . ANKLE SURGERY    . COLONOSCOPY  01/18/2020  . UPPER GI ENDOSCOPY     Social History   Social History Narrative  . Not on file   Immunization History  Administered Date(s) Administered  . Influenza,inj,Quad PF,6+ Mos 11/16/2018  . Influenza-Unspecified 01/13/2022, 12/25/2022  . Moderna Sars-Covid-2 Vaccination 03/08/2019, 04/05/2019, 11/23/2019, 06/02/2020, 03/21/2021  . [employer] 5y-11y 12/25/2022  . Pfizer(Comirnaty)Fall Seasonal Vaccine 12 years and older 01/13/2022  . Pneumococcal Polysaccharide-23 11/16/2018  . Tdap 05/16/2010, 06/16/2020  . Zoster Recombinant(Shingrix) 11/16/2018, 01/16/2019     Objective: Vital Signs: BP 122/86 (BP Location: Left Arm, Patient Position: Sitting, Cuff Size: Large)   Pulse 76   Resp 14   Ht 5' 7.75" (1.721 m)   Wt 265 lb 9.6 oz (120.5 kg)   BMI 40.68 kg/m    Physical Exam Vitals and nursing note reviewed.  Constitutional:      Appearance: She is well-developed.  HENT:     Head: Normocephalic and atraumatic.  Eyes:     Conjunctiva/sclera: Conjunctivae normal.  Cardiovascular:     Rate and Rhythm: Normal rate and regular rhythm.     Heart sounds: Normal heart sounds.  Pulmonary:     Effort: Pulmonary effort is normal.     Breath sounds: Normal breath sounds.  Abdominal:     General: Bowel sounds are normal.     Palpations: Abdomen is soft.  Musculoskeletal:     Cervical back: Normal range of motion.  Lymphadenopathy:     Cervical: No cervical adenopathy.  Skin:    General: Skin is warm and dry.     Capillary Refill: Capillary refill takes less than 2 seconds.  Neurological:     Mental Status: She is alert and oriented to person, place, and time.  Psychiatric:        Behavior:  Behavior normal.     Musculoskeletal Exam: C-spine, thoracic spine, lumbar spine and good range of motion.  Shoulder joints, elbow joints, wrist joints, MCPs, PIPs, DIPs have good range of motion with no synovitis.  Complete fist formation bilaterally.  Hip joints have good range of motion with no groin pain.  Knee joints have good ROM with no warmth or effusion.  Ankle joints have good ROM with no tenderness or joint swelling.  No tenderness or synovitis of MTP joints.  Pes cavus bilaterally.  Tenderness and thickening of right 2nd PIP joint.   CDAI Exam: CDAI Score: -- Patient Global: --; Provider Global: -- Swollen: --; Tender: -- Joint Exam 04/20/2023   No joint exam has been documented for this visit   There is currently no information documented on the homunculus. Go to the Rheumatology activity and complete the homunculus joint  exam.  Investigation: No additional findings.  Imaging: No results found.   Recent Labs: Lab Results  Component Value Date   WBC 6.5 03/17/2023   HGB 13.5 03/17/2023   PLT 209 03/17/2023   NA 142 03/17/2023   K 4.3 03/17/2023   CL 107 03/17/2023   CO2 28 03/17/2023   GLUCOSE 108 (H) 03/17/2023   BUN 15 03/17/2023   CREATININE 0.58 03/17/2023   BILITOT 0.5 03/17/2023   ALKPHOS 75 05/12/2022   AST 15 03/17/2023   ALT 22 03/17/2023   PROT 6.4 03/17/2023   ALBUMIN 3.9 05/12/2022   CALCIUM 9.7 03/17/2023   GFRAA 114 08/26/2020   QFTBGOLDPLUS NEGATIVE 11/20/2019    Speciality Comments: PLQ Eye Exam: 10/19/2022 WNL with Pre-existing Toxo Scars @ [facility] Follow up 1 year  Procedures:  No procedures performed Allergies: Penicillins     Assessment / Plan:     Visit Diagnoses: Rheumatoid arthritis involving multiple sites with positive rheumatoid factor (HCC): She has no synovitis on examination today.  She has not had any signs or symptoms of a rheumatoid arthritis flare.  She experiences intermittent aching in her hands  and feet but has not noticed any active inflammation.  She is currently prescribed Otrexup 25 mg subcu days injections once weekly, folic acid 2 mg daily, and Plaquenil 200 mg 1 tablet by mouth daily.  Patient recently had a bout a 6-week gap in therapy while recovering from pneumonia and bronchitis but resumed therapy about 2 weeks ago.  She did not notice any new or worsening symptoms while off of these medications.  She has not been taking any over-the-counter products for symptomatic relief.  No medication changes will be made at this time.  She is vies notify us if she develops signs or symptoms of a flare.  She will follow-up in the office in 5 months or sooner if needed.  High risk medication use: Otrexup 25 mg sq injections once weekly, folic acid 2 mg po daily, and plaquenil 200 mg 1 tablet by mouth daily.  CBC and CMP updated on 03/17/23.  Her next lab work will be due in April and every 3 months.   Patient is scheduled to have a physical in March 2025 at which time she will also be having a lipid panel obtained. PLQ Eye Exam: 10/19/2022 WNL with Pre-existing Toxo Scars @ [facility] Follow up 1 year  History of fatty liver ultrasound June 23, 2020- Dr. Barron Alvine  Chronic uveitis of both eyes: No signs or symptoms of uveitis flare.  No conjunctival injection.  No medication changes will be made at this time.  Primary osteoarthritis of both knees: No warmth or effusion noted.  Chronic SI joint pain: Intermittent discomfort.  Patient has returned to exercising at the gym including water exercise as well as using an elliptical.  Pes cavus  DDD (degenerative disc disease), cervical: C-spine has good range of motion with no discomfort at this time.  No symptoms of radiculopathy.  Degeneration of intervertebral disc of lumbar region without discogenic back pain or lower extremity pain: Patient had a spinal ablation performed in August 2024 which righted significant relief.  Her  lower back pain completely resolved after the procedure but has started to gradually return.  She has been exercising including using the elliptical and water exercise recently.  Show following up with her back specialist soon.  Other medical conditions are listed as follows:   Family history of systemic lupus erythematosus (SLE)  in mother  History of melanoma  History of gastroesophageal reflux (GERD)  History of diverticulosis  Osteoporosis screening - 05/21/19: The BMD measured at [facility] L1-L2 is 1.168 g/cm2 with a T-score of 0.0.  Orders: No orders of the defined types were placed in this encounter.  No orders of the defined types were placed in this encounter.   Follow-Up Instructions: Return in about 5 months (around 09/17/2023) for Rheumatoid arthritis.   Gearldine Bienenstock, PA-C  Note - This record has been created using Dragon software.  Chart creation errors have been sought, but may not always  have been located. Such creation errors do not reflect on  the standard of medical care.

## 2023-04-20 ENCOUNTER — Encounter: Payer: Self-pay | Admitting: Physician Assistant

## 2023-04-20 ENCOUNTER — Ambulatory Visit: Payer: BC Managed Care – PPO | Attending: Physician Assistant | Admitting: Physician Assistant

## 2023-04-20 VITALS — BP 122/86 | HR 76 | Resp 14 | Ht 67.75 in | Wt 265.6 lb

## 2023-04-20 DIAGNOSIS — H2013 Chronic iridocyclitis, bilateral: Secondary | ICD-10-CM

## 2023-04-20 DIAGNOSIS — M51369 Other intervertebral disc degeneration, lumbar region without mention of lumbar back pain or lower extremity pain: Secondary | ICD-10-CM

## 2023-04-20 DIAGNOSIS — M5030 Other cervical disc degeneration, unspecified cervical region: Secondary | ICD-10-CM

## 2023-04-20 DIAGNOSIS — M0579 Rheumatoid arthritis with rheumatoid factor of multiple sites without organ or systems involvement: Secondary | ICD-10-CM | POA: Diagnosis not present

## 2023-04-20 DIAGNOSIS — Z79899 Other long term (current) drug therapy: Secondary | ICD-10-CM | POA: Diagnosis not present

## 2023-04-20 DIAGNOSIS — Q667 Congenital pes cavus, unspecified foot: Secondary | ICD-10-CM

## 2023-04-20 DIAGNOSIS — Z8719 Personal history of other diseases of the digestive system: Secondary | ICD-10-CM

## 2023-04-20 DIAGNOSIS — G8929 Other chronic pain: Secondary | ICD-10-CM

## 2023-04-20 DIAGNOSIS — Z13820 Encounter for screening for osteoporosis: Secondary | ICD-10-CM

## 2023-04-20 DIAGNOSIS — Z85820 Personal history of malignant melanoma of skin: Secondary | ICD-10-CM

## 2023-04-20 DIAGNOSIS — Z8269 Family history of other diseases of the musculoskeletal system and connective tissue: Secondary | ICD-10-CM

## 2023-04-20 DIAGNOSIS — M170 Bilateral primary osteoarthritis of knee: Secondary | ICD-10-CM | POA: Diagnosis not present

## 2023-04-20 DIAGNOSIS — M533 Sacrococcygeal disorders, not elsewhere classified: Secondary | ICD-10-CM

## 2023-04-20 NOTE — Patient Instructions (Signed)
 Standing Labs We placed an order today for your standing lab work.   Please have your standing labs drawn in mid-April and every 3 months    Please have your labs drawn 2 weeks prior to your appointment so that the provider can discuss your lab results at your appointment, if possible.  Please note that you may see your imaging and lab results in MyChart before we have reviewed them. We will contact you once all results are reviewed. Please allow our office up to 72 hours to thoroughly review all of the results before contacting the office for clarification of your results.  WALK-IN LAB HOURS  Monday through Thursday from 8:00 am -12:30 pm and 1:00 pm-5:00 pm and Friday from 8:00 am-12:00 pm.  Patients with office visits requiring labs will be seen before walk-in labs.  You may encounter longer than normal wait times. Please allow additional time. Wait times may be shorter on  Monday and Thursday afternoons.  We do not book appointments for walk-in labs. We appreciate your patience and understanding with our staff.   Labs are drawn by [facility]. Please bring your co-pay at the time of your lab draw.  You may receive a bill from [facility] for your lab work.  Please note if you are on Hydroxychloroquine and and an order has been placed for a Hydroxychloroquine level,  you will need to have it drawn 4 hours or more after your last dose.  If you wish to have your labs drawn at another location, please call the office 24 hours in advance so we can fax the orders.  The office is located at [address], [address], [place], [place] [address]   If you have any questions regarding directions or hours of operation,  please call [phone].   As a reminder, please drink plenty of water prior to coming for your lab work. Thanks!

## 2023-05-22 MED FILL — Amitriptyline HCl Tab 50 MG: ORAL | 90 days supply | Qty: 90 | Fill #1 | Status: AC

## 2023-05-23 ENCOUNTER — Other Ambulatory Visit: Payer: Self-pay

## 2023-05-24 ENCOUNTER — Encounter: Payer: BC Managed Care – PPO | Admitting: Family Medicine

## 2023-05-26 ENCOUNTER — Ambulatory Visit
Admission: RE | Admit: 2023-05-26 | Discharge: 2023-05-26 | Disposition: A | Discharge status: Home / Self Care | Source: Ambulatory Visit | Attending: Emergency Medicine | Admitting: Emergency Medicine

## 2023-05-26 VITALS — BP 116/73 | HR 72 | Temp 98.5°F | Resp 18

## 2023-05-26 DIAGNOSIS — B9689 Other specified bacterial agents as the cause of diseases classified elsewhere: Secondary | ICD-10-CM | POA: Diagnosis not present

## 2023-05-26 DIAGNOSIS — R051 Acute cough: Secondary | ICD-10-CM

## 2023-05-26 DIAGNOSIS — J019 Acute sinusitis, unspecified: Secondary | ICD-10-CM

## 2023-05-26 MED ORDER — PREDNISONE 20 MG PO TABS: 40.0000 mg | ORAL_TABLET | Freq: Every day | ORAL | 0 refills | Status: AC

## 2023-05-26 MED ORDER — LEVOFLOXACIN 750 MG PO TABS: 750.0000 mg | ORAL_TABLET | Freq: Every day | ORAL | 0 refills | Status: AC

## 2023-05-26 MED ORDER — ALBUTEROL SULFATE HFA 108 (90 BASE) MCG/ACT IN AERS: 2.0000 | INHALATION_SPRAY | Freq: Four times a day (QID) | RESPIRATORY_TRACT | 1 refills | Status: AC | PRN

## 2023-05-26 NOTE — Discharge Instructions (Addendum)
 Levaquin -- 1 tablet daily for 5 days Always take with food  Prednisone -- 2 tablets daily for 5 days  Albuterol inhaler -- 3 times daily for the next week  Please go to the emergency department if symptoms worsen or become severe.

## 2023-05-26 NOTE — ED Provider Notes (Signed)
 [facility]    CSN: [id number] Arrival date & time: 05/26/23  1715      History   Chief Complaint Chief Complaint  Patient presents with   Cough    cough and congestion x 5 days - Entered by patient   Nasal Congestion    HPI Brittany Malone is a 58 y.o. female.  Here with 5 day history of nasal congestion, ear fullness, productive cough and headache. Progressing, worsening over the days. No fevers Has been using claritin, sudafed, mucinex, ibuprofen, and flonase without any relief.  She was sick with same in December to January, had z-pack with cefdinir and two rounds of prednisone; minimal relief. Saw PCP January 22nd and got doxycycline, finally improved. Also was given steroid inhaler (beclomethasone). Has not used during this new illness.   Past Medical History:  Diagnosis Date   Allergy    SEASONAL   Anemia    Arthritis    Back pain    Diverticulitis    Fatty liver    per patient   GERD (gastroesophageal reflux disease)    Insomnia    Joint pain    NAFLD (nonalcoholic fatty liver disease)    Rheumatoid arthritis (HCC)     Patient Active Problem List   Diagnosis Date Noted   Primary osteoarthritis of both knees 02/23/2023   DDD (degenerative disc disease), cervical 02/23/2023   Degeneration of intervertebral disc of lumbar region without discogenic back pain or lower extremity pain 02/23/2023   NAFLD (nonalcoholic fatty liver disease)    GERD (gastroesophageal reflux disease)    Rheumatoid arthritis involving multiple sites with positive rheumatoid factor (HCC) 08/15/2018   Family history of rheumatoid arthritis 08/15/2018   Chronic uveitis of both eyes 08/15/2018   Pes cavus 08/15/2018   History of gastroesophageal reflux (GERD) 08/15/2018    Past Surgical History:  Procedure Laterality Date   ABDOMINAL HYSTERECTOMY     ANKLE SURGERY     COLONOSCOPY  01/18/2020   UPPER GI ENDOSCOPY      OB History     Gravida  3   Para  3   Term   3   Preterm      AB      Living         SAB      IAB      Ectopic      Multiple      Live Births               Home Medications    Prior to Admission medications   Medication Sig Start Date End Date Taking? Authorizing Provider  albuterol (VENTOLIN HFA) 108 (90 Base) MCG/ACT inhaler Inhale 2 puffs into the lungs every 6 (six) hours as needed for wheezing or shortness of breath. 05/26/23  Yes Cassara Nida, Lurena Joiner, PA-C  amitriptyline (ELAVIL) 50 MG tablet TAKE 1 TABLET BY MOUTH AT BEDTIME AS NEEDED FOR SLEEP 09/06/22  Yes [name], Jilda Roche, DO  beclomethasone (QVAR REDIHALER) 40 MCG/ACT inhaler Inhale 2 puffs into the lungs 2 (two) times daily. Rinse mouth out after use. Patient taking differently: Inhale 2 puffs into the lungs as needed. Rinse mouth out after use. 03/25/23  Yes [name], Jilda Roche, DO  fluticasone [facility]) 50 MCG/ACT nasal spray Place into both nostrils as needed for allergies or rhinitis.   Yes [provider]  folic acid (FOLVITE) 1 MG tablet Take 2 tablets (2 mg total) by mouth daily. 09/28/22  Yes Gearldine Bienenstock,  PA-C  hydroxychloroquine (PLAQUENIL) 200 MG tablet Take 1 tablet (200 mg total) by mouth daily. 02/28/23  Yes Gearldine Bienenstock, PA-C  levofloxacin (LEVAQUIN) 750 MG tablet Take 1 tablet (750 mg total) by mouth daily for 5 days. 05/26/23 05/31/23 Yes Cindia Hustead, PA-C  MELATONIN PO Take by mouth as needed.   Yes [provider]  Methotrexate, PF, (OTREXUP) 25 MG/0.4ML SOAJ Inject 25 mg into the skin once a week. 09/29/22  Yes Gearldine Bienenstock, PA-C  predniSONE (DELTASONE) 20 MG tablet Take 2 tablets (40 mg total) by mouth daily with breakfast for 5 days. 05/26/23 05/31/23 Yes Mette Southgate, Lurena Joiner, PA-C  acetaminophen (TYLENOL) 325 MG tablet Take 650 mg by mouth every 6 (six) hours as needed.    [provider]  ferrous sulfate 325 (65 FE) MG tablet Take by mouth. Patient not taking: Reported on 04/20/2023    [provider]  polyethylene glycol (MIRALAX / GLYCOLAX) 17 g packet Take 17 g by mouth daily as needed. 09/30/20   Opalski, Gavin Pound, DO  TUBERCULIN SYR 1CC/27GX1/2" 27G X 1/2" 1 ML MISC Use 1 syringe once weekly to inject methotrexate. Patient not taking: Reported on 04/20/2023 12/23/20   Pollyann Savoy, MD    Family History Family History  Problem Relation Age of Onset   Lupus Mother    Fibromyalgia Mother    Hyperlipidemia Mother    Hypertension Father    High Cholesterol Father    Hyperlipidemia Father    Colon polyps Father    Kidney Stones Brother    Healthy Son    Hypertension Daughter    Migraines Daughter    Colon cancer Neg Hx    Esophageal cancer Neg Hx    Rectal cancer Neg Hx    Stomach cancer Neg Hx     Social History Social History   Tobacco Use   Smoking status: Never    Passive exposure: Past   Smokeless tobacco: Never  Vaping Use   Vaping status: Never Used  Substance Use Topics   Alcohol use: Yes    Comment: rarely    Drug use: Never     Allergies   Penicillins   Review of Systems Review of Systems  Respiratory:  Positive for cough.    Per HPI  Physical Exam Triage Vital Signs ED Triage Vitals  Encounter Vitals Group     BP 05/26/23 1729 116/73     Systolic BP Percentile --      Diastolic BP Percentile --      Pulse Rate 05/26/23 1729 72     Resp 05/26/23 1729 18     Temp 05/26/23 1729 98.5 F (36.9 C)     Temp Source 05/26/23 1729 Oral     SpO2 05/26/23 1729 96 %     Weight --      Height --      Head Circumference --      Peak Flow --      Pain Score 05/26/23 1730 0     Pain Loc --      Pain Education --      Exclude from Growth Chart --    No data found.  Updated Vital Signs BP 116/73 (BP Location: Left Arm)   Pulse 72   Temp 98.5 F (36.9 C) (Oral)   Resp 18   SpO2 96%   Physical Exam Vitals and nursing note reviewed.  Constitutional:      General: She is not in acute distress.  Appearance: She is not ill-appearing.   HENT:     Right Ear: Tympanic membrane and ear canal normal.     Left Ear: Tympanic membrane and ear canal normal.     Nose: Congestion and rhinorrhea present.     Mouth/Throat:     Mouth: Mucous membranes are moist.     Pharynx: Oropharynx is clear. No posterior oropharyngeal erythema.  Eyes:     Conjunctiva/sclera: Conjunctivae normal.  Cardiovascular:     Rate and Rhythm: Normal rate and regular rhythm.     Pulses: Normal pulses.     Heart sounds: Normal heart sounds.  Pulmonary:     Effort: Pulmonary effort is normal.     Breath sounds: Wheezing present.     Comments: Expiratory wheezing in all lung fields.  There are no rales or rhonchi.  No respiratory distress, speaks in full sentences Musculoskeletal:     Cervical back: Normal range of motion. No rigidity or tenderness.  Lymphadenopathy:     Cervical: No cervical adenopathy.  Skin:    General: Skin is warm and dry.  Neurological:     Mental Status: She is alert and oriented to person, place, and time.     UC Treatments / Results  Labs (all labs ordered are listed, but only abnormal results are displayed) Labs Reviewed - No data to display  EKG  Radiology No results found.  Procedures Procedures   Medications Ordered in UC Medications - No data to display  Initial Impression / Assessment and Plan / UC Course  I have reviewed the triage vital signs and the nursing notes.  Pertinent labs & imaging results that were available during my care of the patient were reviewed by me and considered in my medical decision making (see chart for details).  Patient currently afebrile, well-appearing, satting 97% room air She had a long sickness December through January that finally resolved.  Started similar to current symptoms.  As she reports progression and worsening of symptoms over the 5 days despite several over-the-counter therapies, we will go ahead and cover with antibiotic. Levaquin 750 mg daily x 5 days -  discussed sinus and lung coverage. Also try prednisone burst and albuterol inhaler for wheezing. Advised close follow with PCP, return and ED precautions. Patient agrees to plan, no questions   Final Clinical Impressions(s) / UC Diagnoses   Final diagnoses:  Acute bacterial sinusitis  Acute cough     Discharge Instructions      Levaquin -- 1 tablet daily for 5 days Always take with food  Prednisone -- 2 tablets daily for 5 days  Albuterol inhaler -- 3 times daily for the next week  Please go to the emergency department if symptoms worsen or become severe.     ED Prescriptions     Medication Sig Dispense Auth. Provider   levofloxacin (LEVAQUIN) 750 MG tablet Take 1 tablet (750 mg total) by mouth daily for 5 days. 5 tablet Darianna Amy, PA-C   predniSONE (DELTASONE) 20 MG tablet Take 2 tablets (40 mg total) by mouth daily with breakfast for 5 days. 10 tablet Tamirah George, PA-C   albuterol (VENTOLIN HFA) 108 (90 Base) MCG/ACT inhaler Inhale 2 puffs into the lungs every 6 (six) hours as needed for wheezing or shortness of breath. 8 g Tab Rylee, Lurena Joiner, PA-C      PDMP not reviewed this encounter.   Kathrine Haddock 05/26/23 1610

## 2023-05-26 NOTE — ED Triage Notes (Signed)
 Cough, headache, ear fullness, congestion x 5 days. Taking tylenol, Claritin, sudafed, mucinex and ibuprofen, qvar inhaler with no relief of symptoms.   Pt stats she took a home covid test 2 days ago and it was negative.

## 2023-05-30 ENCOUNTER — Encounter: Payer: Self-pay | Admitting: Family Medicine

## 2023-06-02 DIAGNOSIS — Z0289 Encounter for other administrative examinations: Secondary | ICD-10-CM

## 2023-06-09 DIAGNOSIS — M47816 Spondylosis without myelopathy or radiculopathy, lumbar region: Secondary | ICD-10-CM | POA: Diagnosis not present

## 2023-06-16 DIAGNOSIS — M47816 Spondylosis without myelopathy or radiculopathy, lumbar region: Secondary | ICD-10-CM | POA: Diagnosis not present

## 2023-06-27 ENCOUNTER — Other Ambulatory Visit (HOSPITAL_BASED_OUTPATIENT_CLINIC_OR_DEPARTMENT_OTHER): Payer: Self-pay

## 2023-06-27 ENCOUNTER — Other Ambulatory Visit: Payer: Self-pay | Admitting: Physician Assistant

## 2023-06-27 DIAGNOSIS — M0579 Rheumatoid arthritis with rheumatoid factor of multiple sites without organ or systems involvement: Secondary | ICD-10-CM

## 2023-06-27 MED ORDER — HYDROXYCHLOROQUINE SULFATE 200 MG PO TABS
200.0000 mg | ORAL_TABLET | Freq: Every day | ORAL | 0 refills | Status: DC
  Filled 2023-06-27 (×2): qty 90, 90d supply, fill #0

## 2023-06-27 NOTE — Telephone Encounter (Signed)
 Last Fill: 02/28/2023  Eye exam: 10/01/2022    Labs: 03/17/2023 CBC and CMP are normal.   Next Visit: 09/28/2023  Last Visit: 04/20/2023  [id number] arthritis involving multiple sites with positive rheumatoid factor   Current Dose per office note 04/20/2023: plaquenil  200 mg 1 tablet by mouth daily.   Okay to refill Plaquenil ?

## 2023-07-10 ENCOUNTER — Other Ambulatory Visit: Payer: Self-pay | Admitting: Physician Assistant

## 2023-07-10 DIAGNOSIS — Z79899 Other long term (current) drug therapy: Secondary | ICD-10-CM

## 2023-07-10 DIAGNOSIS — M0579 Rheumatoid arthritis with rheumatoid factor of multiple sites without organ or systems involvement: Secondary | ICD-10-CM

## 2023-07-11 ENCOUNTER — Other Ambulatory Visit (HOSPITAL_COMMUNITY): Payer: Self-pay

## 2023-07-11 ENCOUNTER — Other Ambulatory Visit: Payer: Self-pay

## 2023-07-11 MED ORDER — OTREXUP 25 MG/0.4ML ~~LOC~~ SOAJ
25.0000 mg | SUBCUTANEOUS | 2 refills | Status: DC
  Filled 2023-07-11: qty 1.6, 28d supply, fill #0
  Filled 2023-10-07 – 2023-10-26 (×2): qty 1.6, 28d supply, fill #1

## 2023-07-11 NOTE — Telephone Encounter (Signed)
 Last Fill: 09/29/2022  Labs: 03/17/2023 CBC and CMP are normal.   Next Visit: 09/28/2023  Last Visit: 04/20/2023  DX: Rheumatoid arthritis involving multiple sites with positive rheumatoid factor   Current Dose per office note 04/20/2023: Otrexup  25 mg subcu days injections once weekly   Left patient a voicemail advising that we needed to get updated labs  Okay to refill Otrexup ?

## 2023-07-12 ENCOUNTER — Ambulatory Visit (INDEPENDENT_AMBULATORY_CARE_PROVIDER_SITE_OTHER): Admitting: Family Medicine

## 2023-07-18 DIAGNOSIS — M47816 Spondylosis without myelopathy or radiculopathy, lumbar region: Secondary | ICD-10-CM | POA: Diagnosis not present

## 2023-08-03 ENCOUNTER — Encounter (INDEPENDENT_AMBULATORY_CARE_PROVIDER_SITE_OTHER): Payer: Self-pay

## 2023-08-04 ENCOUNTER — Encounter (INDEPENDENT_AMBULATORY_CARE_PROVIDER_SITE_OTHER): Payer: Self-pay | Admitting: Internal Medicine

## 2023-08-04 ENCOUNTER — Ambulatory Visit (INDEPENDENT_AMBULATORY_CARE_PROVIDER_SITE_OTHER): Admitting: Internal Medicine

## 2023-08-04 VITALS — BP 124/85 | HR 83 | Temp 98.1°F | Ht 67.0 in | Wt 264.0 lb

## 2023-08-04 DIAGNOSIS — E66813 Obesity, class 3: Secondary | ICD-10-CM | POA: Diagnosis not present

## 2023-08-04 DIAGNOSIS — R0602 Shortness of breath: Secondary | ICD-10-CM | POA: Diagnosis not present

## 2023-08-04 DIAGNOSIS — E669 Obesity, unspecified: Secondary | ICD-10-CM | POA: Insufficient documentation

## 2023-08-04 DIAGNOSIS — Z1331 Encounter for screening for depression: Secondary | ICD-10-CM

## 2023-08-04 DIAGNOSIS — K760 Fatty (change of) liver, not elsewhere classified: Secondary | ICD-10-CM

## 2023-08-04 DIAGNOSIS — Z6841 Body Mass Index (BMI) 40.0 and over, adult: Secondary | ICD-10-CM

## 2023-08-04 DIAGNOSIS — R5383 Other fatigue: Secondary | ICD-10-CM

## 2023-08-04 DIAGNOSIS — E559 Vitamin D deficiency, unspecified: Secondary | ICD-10-CM | POA: Insufficient documentation

## 2023-08-04 DIAGNOSIS — T50905A Adverse effect of unspecified drugs, medicaments and biological substances, initial encounter: Secondary | ICD-10-CM | POA: Insufficient documentation

## 2023-08-04 DIAGNOSIS — T43205A Adverse effect of unspecified antidepressants, initial encounter: Secondary | ICD-10-CM | POA: Diagnosis not present

## 2023-08-04 NOTE — Assessment & Plan Note (Signed)
 Most recent vitamin D  levels  Lab Results  Component Value Date   VD25OH 19.4 (L) 08/12/2020     Deficiency state associated with adiposity and may result in leptin resistance, weight gain and fatigue.   Plan: Check vitamin D  levels today

## 2023-08-04 NOTE — Assessment & Plan Note (Addendum)
 Contributing factors: Medications, genetics, menopause, consumption of processed foods.  Negative screen for sleep apnea.   Patient will be screened for insulin  resistance and prediabetes  See obesity treatment plan

## 2023-08-04 NOTE — Assessment & Plan Note (Signed)
 Based on ultrasound from 2022.  Most recent liver enzymes and platelet count are within normal limits.   Fibrosis 4 Score = .92 (Low risk)        Interpretation for patients with NAFLD          <1.30       -  F0-F1 (Low risk)          1.30-2.67 -  Indeterminate           >2.67      -  F3-F4 (High risk)     Validated for ages 13-65       Losing 15% of body weight may improve condition.  Also treatment with GLP-1.  She will work on reducing saturated fats simple carbs and diet.

## 2023-08-04 NOTE — Assessment & Plan Note (Signed)
 Patient is on amitriptyline  and has been on medication for several years.  This medication may contribute to weight gain and may make weight loss difficult.  She will work with prescribing physician to find weight neutral alternative.

## 2023-08-04 NOTE — Progress Notes (Signed)
 [address] [place],  [place], [place] [address]  Office: [phone]  /  Fax: [phone]   Subjective   Initial Visit (former patient returning)  Brittany Malone (MR# [id number]) is a 58 y.o. female who presents for evaluation and treatment of obesity and related comorbidities. Current BMI is Body mass index is 41.35 kg/m. Brittany Malone has been struggling with her weight for many years and has been unsuccessful in either losing weight, maintaining weight loss, or reaching her healthy weight goal.  Brittany Malone is currently in the action stage of change and ready to dedicate time achieving and maintaining a healthier weight. Brittany Malone is interested in becoming our patient and working on intensive lifestyle modifications including (but not limited to) diet and exercise for weight loss.  Weight history:  When asked how Brittany Malone weight has affected Brittany Malone life and health, she states: Contributed to medical problems, Contributed to orthopedic problems or mobility issues, Having fatigue, and Having poor endurance  When asked what else they would like to accomplish? She states: Adopt healthier eating patterns, Improve energy levels and physical activity, Improve existing medical conditions, and Improve quality of life  She starting to note weight gain during : childhood.  Life events associated with weight gain include : pregnancy.   Other contributing factors: family history of obesity, consumption of processed foods, use of obesogenic medications: Psychotropic medications, reduced physical acitivity, and menopause.  Brittany Malone highest weight has been:  277 lbs.  Desired weight: 200-220  Previous weight-loss programs : Weight Watchers and Balanced Plate / Portion Control.  Brittany Malone maximum weight loss was:  40-60 lbs.  Brittany Malone greatest challenge with dieting: meal preparation and cooking.  Current or previous pharmacotherapy: None and Is interested in pharmacotherapy.  Response to medication: Never tried  medications   Nutritional History:  Current nutrition plan: None.  How many times do you eat outside the home: 1-2 per week  How often do they skip meals: does not skip meals  What beverages do they drink: diet soda .   Use of artificial sweetners : Yes  Food intolerances or dislikes: none.  Food triggers: Stress.  Food cravings: Sugary  Do they struggle with excessive hunger or portion control : No    Physical Activity:  Current level of physical activity: Limited due to chronic pain or orthopedic problems and Low levels of physical activity at present  Barriers to Exercise: orthopedic problems   Past medical history includes:   Past Medical History:  Diagnosis Date   Allergy    SEASONAL   Anemia    Arthritis    Back pain    Back pain    Chronic vertigo    Degenerative joint disease of cervical and lumbar spine    Diverticulitis    Fatty liver    per patient   GERD (gastroesophageal reflux disease)    Insomnia    Joint pain    Joint pain    NAFLD (nonalcoholic fatty liver disease)    Osteoarthritis    Rheumatoid arthritis (HCC)      Objective   BP 124/85   Pulse 83   Temp 98.1 F (36.7 C)   Ht 5\' 7"  (1.702 m)   Wt 264 lb (119.7 kg)   SpO2 95%   BMI 41.35 kg/m  She was weighed on the bioimpedance scale: Body mass index is 41.35 kg/m.    Anthropometrics:  Vitals Temp: 98.1 F (36.7 C) BP: 124/85 Pulse Rate: 83 SpO2: 95 %   Anthropometric Measurements Height: 5\' 7"  (  1.702 m) Weight: 264 lb (119.7 kg) BMI (Calculated): 41.34 Starting Weight: 264 lb Peak Weight: 277 lb Waist Measurement : 47 inches   Body Composition  Body Fat %: 51.3 % Fat Mass (lbs): 135.8 lbs Muscle Mass (lbs): 122.4 lbs Total Body Water (lbs): 99.6 lbs Visceral Fat Rating : 17   Other Clinical Data RMR: 1944 Fasting: Yes Labs: Yes Today's Visit #: 1 Starting Date: 08/04/22    Physical Exam:  General: She is overweight, cooperative, alert, well  developed, and in no acute distress. PSYCH: Has normal mood, affect and thought process.   HEENT: EOMI, sclerae are anicteric. Lungs: Normal breathing effort, no conversational dyspnea. Extremities: No edema.  Neurologic: No gross sensory or motor deficits. No tremors or fasciculations noted.    Diagnostic Data Reviewed  EKG: Normal sinus rhythm, rate 75. No conduction abnormalities, abnormal Q waves or chamber enlargement.  Indirect Calorimeter completed today shows a VO2 of 281 and a REE of 1944.  Her calculated basal metabolic rate is 0981 thus her resting energy expenditure faster than calculated.1944  Depression Screen  Brittany Malone's PHQ-9 score was: 1.     08/04/2023    7:48 AM  Depression screen PHQ 2/9  Decreased Interest 0  Down, Depressed, Hopeless 0  PHQ - 2 Score 0  Altered sleeping 1  Tired, decreased energy 0  Change in appetite 0  Feeling bad or failure about yourself  0  Trouble concentrating 0  Moving slowly or fidgety/restless 0  Suicidal thoughts 0  PHQ-9 Score 1  Difficult doing work/chores Not difficult at all    Screening for Sleep Related Breathing Disorders  Brittany Malone admits to daytime somnolence and admits to waking up still tired. Patient denies a history of symptoms of OSA.  [name] generally gets 7 hours of sleep per night, and states that she has generally restful sleep. Snoring is present. Apneic episodes are not present. Epworth Sleepiness Score is 4.   BMET    Component Value Date/Time   NA 142 03/17/2023 0823   K 4.3 03/17/2023 0823   CL 107 03/17/2023 0823   CO2 28 03/17/2023 0823   GLUCOSE 108 (H) 03/17/2023 0823   BUN 15 03/17/2023 0823   CREATININE 0.58 03/17/2023 0823   CALCIUM 9.7 03/17/2023 0823   GFRNONAA >60 12/15/2020 1901   GFRNONAA 98 08/26/2020 1607   GFRAA 114 08/26/2020 1607   Lab Results  Component Value Date   HGBA1C 5.4 06/16/2020   Lab Results  Component Value Date   INSULIN  9.9 08/12/2020   CBC    Component Value  Date/Time   WBC 6.5 03/17/2023 0823   RBC 4.54 03/17/2023 0823   HGB 13.5 03/17/2023 0823   HCT 40.2 03/17/2023 0823   PLT 209 03/17/2023 0823   MCV 88.5 03/17/2023 0823   MCH 29.7 03/17/2023 0823   MCHC 33.6 03/17/2023 0823   RDW 12.7 03/17/2023 0823   Iron/TIBC/Ferritin/ %Sat    Component Value Date/Time   IRON 116 04/16/2020 1227   FERRITIN 94.8 04/16/2020 1227   IRONPCTSAT 32.6 04/16/2020 1227   Lipid Panel     Component Value Date/Time   CHOL 176 05/12/2022 0726   TRIG 93.0 05/12/2022 0726   HDL 64.00 05/12/2022 0726   CHOLHDL 3 05/12/2022 0726   VLDL 18.6 05/12/2022 0726   LDLCALC 93 05/12/2022 0726   Hepatic Function Panel     Component Value Date/Time   PROT 6.4 03/17/2023 0823   ALBUMIN 3.9 05/12/2022 0726   AST  15 03/17/2023 0823   ALT 22 03/17/2023 0823   ALKPHOS 75 05/12/2022 0726   BILITOT 0.5 03/17/2023 0823   BILIDIR 0.1 12/22/2020 0753   IBILI 0.3 12/22/2020 0753      Component Value Date/Time   TSH 2.200 08/12/2020 0836     Assessment and Plan   TREATMENT PLAN FOR OBESITY:  Recommended Dietary Goals  Brittany Malone is currently in the action stage of change. As such, her goal is to implement medically supervised weight loss plan.  She has agreed to implement: the Category 3 plan - 1500 kcal per day  Behavioral Intervention  We discussed the following Behavioral Modification Strategies today: increasing lean protein intake to established goals, decreasing simple carbohydrates , increasing vegetables, increasing lower glycemic fruits, increasing fiber rich foods, avoiding skipping meals, increasing water intake, work on meal planning and preparation, reading food labels , keeping healthy foods at home, identifying sources and decreasing liquid calories, decreasing eating out or consumption of processed foods, and making healthy choices when eating convenient foods, planning for success, and better snacking choices  Additional resources provided today:  Handout on healthy eating and balanced plate, Handout on complex carbohydrates and lean sources of protein, and Category 3 packet  Recommended Physical Activity Goals  Brittany Malone has been advised to work up to 150 minutes of moderate intensity aerobic activity a week and strengthening exercises 2-3 times per week for cardiovascular health, weight loss maintenance and preservation of muscle mass.   She has agreed to :  Think about enjoyable ways to increase daily physical activity and overcoming barriers to exercise and Increase physical activity in Brittany Malone day and reduce sedentary time (increase NEAT).  Pharmacotherapy We will work on building a Therapist, art and behavioral strategies. We will discuss the role of pharmacotherapy as an adjunct at subsequent visits.   ASSOCIATED CONDITIONS ADDRESSED TODAY  Other Fatigue  Shiza admits to daytime somnolence and admits to waking up still tired. Patient denies a history of symptoms of OSA.  Brittany Malone generally gets 7 hours of sleep per night, and states that she has generally restful sleep. Snoring is present. Apneic episodes are not present. Epworth Sleepiness Score is 4. Brittany Malone does feel that her weight is causing her energy to be lower than it should be. Fatigue may be related to obesity, depression or many other causes. Labs will be ordered, and in the meanwhile, Brittany Malone will focus on self care including making healthy food choices, increasing physical activity and focusing on stress reduction.  Shortness of Breath Brittany Malone notes increasing shortness of breath with exercising and seems to be worsening over time with weight gain. She notes getting out of breath sooner with activity than she used to. This has not gotten worse recently. Brittany Malone denies shortness of breath at rest or orthopnea.Brittany Malone notes increasing shortness of breath with exercising and seems to be worsening over time with weight gain. She notes getting out of breath sooner with  activity than she used to. This has not gotten worse recently. Brittany Malone denies shortness of breath at rest or orthopnea.  Other fatigue -     EKG 12-Lead  SOB (shortness of breath) on exertion  Depression screen  Metabolic dysfunction-associated steatotic liver disease (MASLD) Assessment & Plan: Based on ultrasound from 2022.  Most recent liver enzymes and platelet count are within normal limits.   Fibrosis 4 Score = .92 (Low risk)        Interpretation for patients with NAFLD          <  1.30       -  F0-F1 (Low risk)          1.30-2.67 -  Indeterminate           >2.67      -  F3-F4 (High risk)     Validated for ages 29-65       Losing 15% of body weight may improve condition.  Also treatment with GLP-1.  She will work on reducing saturated fats simple carbs and diet.   Orders: -     Vitamin B12 -     Hemoglobin A1c -     Insulin , random -     TSH -     Lipid Panel With LDL/HDL Ratio -     Glucose, fasting  Vitamin D  deficiency Assessment & Plan: Most recent vitamin D  levels  Lab Results  Component Value Date   VD25OH 19.4 (L) 08/12/2020     Deficiency state associated with adiposity and may result in leptin resistance, weight gain and fatigue.   Plan: Check vitamin D  levels today   Orders: -     VITAMIN D  25 Hydroxy (Vit-D Deficiency, Fractures)  Weight gain due to medication Assessment & Plan: Patient is on amitriptyline  and has been on medication for several years.  This medication may contribute to weight gain and may make weight loss difficult.  She will work with prescribing physician to find weight neutral alternative.   Class 3 severe obesity with serious comorbidity and body mass index (BMI) of 40.0 to 44.9 in adult, unspecified obesity type Assessment & Plan: Contributing factors: Medications, genetics, menopause, consumption of processed foods.  Negative screen for sleep apnea.   Patient will be screened for insulin  resistance and prediabetes  See  obesity treatment plan     Follow-up  She was informed of the importance of frequent follow-up visits to maximize her success with intensive lifestyle modifications for her multiple health conditions. She was informed we would discuss her lab results at her next visit unless there is a critical issue that needs to be addressed sooner. Brittany Malone agreed to keep her next visit at the agreed upon time to discuss these results.  Attestation Statement  This is the patient's intake visit at [facility]. The patient's Health Questionnaire was reviewed at length. Included in the packet: current and past health history, medications, allergies, ROS, gynecologic history (women only), surgical history, family history, social history, weight history, weight loss surgery history (for those that have had weight loss surgery), nutritional evaluation, mood and food questionnaire, PHQ9, Epworth questionnaire, sleep habits questionnaire, patient life and health improvement goals questionnaire. These will all be scanned into the patient's chart under media.   During the visit, I independently reviewed the patient's EKG, previous labs, bioimpedance scale results, and indirect calorimetry results. I used this information to medically tailor a meal plan for the patient that will help her to lose weight and will improve her obesity-related conditions. I performed a medically necessary appropriate examination and/or evaluation. I discussed the assessment and treatment plan with the patient. The patient was provided an opportunity to ask questions and all were answered. The patient agreed with the plan and demonstrated an understanding of the instructions. Labs were ordered at this visit and will be reviewed at the next visit unless critical results need to be addressed immediately. Clinical information was updated and documented in the EMR.   In addition, they received basic education on identification of processed  foods and  reduction of these, different sources of lean proteins and complex carbohydrates and how to eat balanced by incorporation of whole foods.  Reviewed by clinician on day of visit: allergies, medications, problem list, medical history, surgical history, family history, social history, and previous encounter notes.  I have spent 53 minutes in the care of the patient today including: 6 minutes before the visit reviewing and preparing the chart. 40 minutes face-to-face assessing and reviewing listed medical problems as outlined in obesity care plan, providing nutritional and behavioral counseling on topics outlined in the obesity care plan, independently interpreting test results and goals of care, as described in assessment and plan, reviewing and discussing biometric information and progress, and ordering diagnostics - see orders 7 minutes after the visit updating chart and documentation of encounter.    Ladd Picker, MD

## 2023-08-05 LAB — LIPID PANEL WITH LDL/HDL RATIO
Cholesterol, Total: 177 mg/dL (ref 100–199)
HDL: 70 mg/dL (ref >39)
LDL Chol Calc (NIH): 90 mg/dL (ref 0–99)
LDL/HDL Ratio: 1.3 ratio (ref 0.0–3.2)
Triglycerides: 97 mg/dL (ref 0–149)
VLDL Cholesterol Cal: 17 mg/dL (ref 5–40)

## 2023-08-05 LAB — INSULIN, RANDOM: INSULIN: 11.7 u[IU]/mL (ref 2.6–24.9)

## 2023-08-05 LAB — VITAMIN D 25 HYDROXY (VIT D DEFICIENCY, FRACTURES): Vit D, 25-Hydroxy: 19 ng/mL — ABNORMAL LOW (ref 30.0–100.0)

## 2023-08-05 LAB — TSH: TSH: 1.58 u[IU]/mL (ref 0.450–4.500)

## 2023-08-05 LAB — VITAMIN B12: Vitamin B-12: 535 pg/mL (ref 232–1245)

## 2023-08-05 LAB — HEMOGLOBIN A1C
Est. average glucose Bld gHb Est-mCnc: 114 mg/dL
Hgb A1c MFr Bld: 5.6 % (ref 4.8–5.6)

## 2023-08-05 LAB — GLUCOSE, FASTING: Glucose, Plasma: 86 mg/dL (ref 70–99)

## 2023-08-17 ENCOUNTER — Ambulatory Visit (INDEPENDENT_AMBULATORY_CARE_PROVIDER_SITE_OTHER): Admitting: Internal Medicine

## 2023-08-17 ENCOUNTER — Encounter (INDEPENDENT_AMBULATORY_CARE_PROVIDER_SITE_OTHER): Payer: Self-pay | Admitting: Internal Medicine

## 2023-08-17 VITALS — BP 133/79 | HR 72 | Temp 97.9°F | Ht 67.0 in | Wt 258.0 lb

## 2023-08-17 DIAGNOSIS — Z6841 Body Mass Index (BMI) 40.0 and over, adult: Secondary | ICD-10-CM

## 2023-08-17 DIAGNOSIS — E88819 Insulin resistance, unspecified: Secondary | ICD-10-CM | POA: Diagnosis not present

## 2023-08-17 DIAGNOSIS — T50905A Adverse effect of unspecified drugs, medicaments and biological substances, initial encounter: Secondary | ICD-10-CM

## 2023-08-17 DIAGNOSIS — E66813 Obesity, class 3: Secondary | ICD-10-CM

## 2023-08-17 DIAGNOSIS — E559 Vitamin D deficiency, unspecified: Secondary | ICD-10-CM

## 2023-08-17 DIAGNOSIS — K760 Fatty (change of) liver, not elsewhere classified: Secondary | ICD-10-CM

## 2023-08-17 DIAGNOSIS — R635 Abnormal weight gain: Secondary | ICD-10-CM

## 2023-08-17 MED ORDER — VITAMIN D (ERGOCALCIFEROL) 1.25 MG (50000 UNIT) PO CAPS: 50000.0000 [IU] | ORAL_CAPSULE | ORAL | 0 refills | Status: DC

## 2023-08-17 NOTE — Progress Notes (Signed)
 Office: [phone]  /  Fax: [phone]  Weight Summary And Biometrics  Vitals Temp: 97.9 F (36.6 C) BP: 133/79 Pulse Rate: 72 SpO2: 97 %   Anthropometric Measurements Height: 5' 7 (1.702 m) Weight: 258 lb (117 kg) BMI (Calculated): 40.4 Weight at Last Visit: 264 lb Weight Lost Since Last Visit: 6 lb Weight Gained Since Last Visit: 0 lb Starting Weight: 264 lb Total Weight Loss (lbs): 6 lb (2.722 kg) Peak Weight: 277 lb   Body Composition  Body Fat %: 49.2 % Fat Mass (lbs): 127.2 lbs Muscle Mass (lbs): 124.8 lbs Total Body Water (lbs): 94.6 lbs Visceral Fat Rating : 16   The 10-year ASCVD risk score (Arnett DK, et al., 2019) is: 2.7%  RMR: 1944  Today's Visit #: 2  Starting Date: 08/04/22   Subjective   Chief Complaint: Obesity  Interval History Discussed the use of AI scribe software for clinical note transcription with the patient, who gave verbal consent to proceed.  History of Present Illness   Brittany Malone is a 58 year old female with obesity who presents for obesity management follow-up.  She has lost six pounds since her last visit two weeks ago. She adheres to a 1500 calorie meal plan 70-80% of the time, consumes more whole foods, ensures adequate protein intake, and maintains hydration. She does not skip meals and is not currently exercising due to back issues but plans to resume gently. She reports adequate sleep and some stress.  She has metabolic dysfunction-associated steatotic liver disease, GERD, and weight gain attributed to amitriptyline  use. Previous labs indicated normal liver enzymes and a low fibrosis score. She is due for lab work related to her rheumatoid arthritis medication, methotrexate , which she takes along with hydroxychloroquine .  She experiences biomechanical complications including osteoarthritis of both knees and degenerative spine disease. Rheumatoid arthritis affects her ability to exercise regularly.  Recent  blood work showed a fasting blood sugar of 108, indicating prediabetes, but her A1c is 5.6. Cholesterol levels are good, with an LDL of 90 and HDL of 70. Her vitamin D  level is low.  No night eating is reported, and she feels satisfied after meals, spacing out snacks like [place] yogurt to manage hunger. She has mild insulin  resistance and sensitivity to carbohydrates, which can affect her weight loss.  She is up to date on most cancer screenings but is due for a colonoscopy next year. She has a partial cervix and has not had a Pap smear recently. Family history includes leukemia in two paternal uncles and some cancer in smokers.  She plans to travel to [place] and [place], where she anticipates walking a lot and maintaining her diet. She has previously lost weight while traveling due to increased physical activity and dietary changes.       Challenges affecting patient progress: low volume of physical activity at present .    Pharmacotherapy for weight management: She is currently taking no anti-obesity medication and she does not have coverage for GLP-1 but is interested in pharmacotherapy.   Assessment and Plan   Treatment Plan For Obesity:  Recommended Dietary Goals  Brittany Malone is currently in the action stage of change. As such, her goal is to continue weight management plan. She has agreed to: continue current plan  Behavioral Health and Counseling  We discussed the following behavioral modification strategies today: staying on track while traveling and vacationing and continue to work on maintaining a reduced calorie state, getting the recommended amount of protein, incorporating whole  foods, making healthy choices, staying well hydrated and practicing mindfulness when eating..  Additional education and resources provided today: Handout on FDA approved anti-obesity medications and adverse effects and Handout on increasing daily activity and exercise goal setting  Recommended Physical  Activity Goals  Brittany Malone has been advised to work up to 150 minutes of moderate intensity aerobic activity a week and strengthening exercises 2-3 times per week for cardiovascular health, weight loss maintenance and preservation of muscle mass.   She has agreed to :  Think about enjoyable ways to increase daily physical activity and overcoming barriers to exercise and Increase physical activity in their day and reduce sedentary time (increase NEAT).  Pharmacotherapy  We discussed various medication options to help Brittany Malone with her weight loss efforts and we both agreed to : Continue with current nutritional and behavioral strategies and we reviewed benefits and side effects of Qsymia she will research medication further.  Associated Conditions Impacted by Obesity Treatment  Metabolic dysfunction-associated steatotic liver disease (MASLD) Assessment & Plan: Based on ultrasound from 2022.  Most recent liver enzymes and platelet count are within normal limits.   Fibrosis 4 Score = .92 (Low risk)        Interpretation for patients with NAFLD          <1.30       -  F0-F1 (Low risk)          1.30-2.67 -  Indeterminate           >2.67      -  F3-F4 (High risk)     Validated for ages 78-65       Losing 15% of body weight may improve condition. She will work on reducing saturated fats simple carbs and diet.  Does not have coverage for GLP-1    Weight gain due to medication Assessment & Plan: Patient is on amitriptyline  and has been on medication for several years.  This medication may contribute to weight gain and may make weight loss difficult.  She will work with prescribing physician to find weight neutral alternative.   Class 3 severe obesity with serious comorbidity and body mass index (BMI) of 40.0 to 44.9 in adult, unspecified obesity type Assessment & Plan: Lost six pounds in two weeks, which is on the high end of expected weight loss. Follows a 1500 calorie meal plan 70-80% of the  time, consumes more whole foods, maintains adequate hydration, and denies skipping meals. Not currently exercising due to back problems but plans to resume gently. Muscle mass has increased, and fat mass has decreased by eight pounds. Visceral fat has decreased from 17 to 16. Appetite is well-controlled with current dietary habits. Discussed potential use of Qsymia for appetite suppression. Qsymia combines phentermine and topiramate, is FDA approved for chronic use, and may aid in appetite suppression.  Risk and common side effects were discussed. - Continue 1500 calorie meal plan - Encourage resumption of gentle exercise - Monitor weight and body composition - Research Qsymia and consider its use  General Health Maintenance Cholesterol profile is excellent with LDL at 90 and HDL at 70. Cardiovascular risk is low at 2.7%. Blood pressure is slightly elevated  at 133/79 but expected to improve with weight loss. Cancer screenings discussed, including breast, cervical, and colon cancer. Due for a Pap smear and colonoscopy next year. - Schedule Pap smear due to partial cervix remaining - Plan colonoscopy for November 2026 - Encourage regular breast cancer screening   Vitamin D  deficiency Assessment &  Plan: Most recent vitamin D  levels  Lab Results  Component Value Date   VD25OH 19.0 (L) 08/04/2023   VD25OH 19.4 (L) 08/12/2020     Deficiency state associated with adiposity and may result in leptin resistance, weight gain and fatigue.   Plan: After discussion of benefits, alternative treatment options and side effects patient will be started on vitamin D2 50,000 units 1 tablet weekly for 3-4 months. for a treatment goal level of 50-60 mg/dl. Check levels at that time for response monitoring.   Orders: -     Vitamin D  (Ergocalciferol ); Take 1 capsule (50,000 Units total) by mouth every 7 (seven) days.  Dispense: 16 capsule; Refill: 0  Insulin  resistance Assessment & Plan: Her HOMA-IR is 2.48  which is mildly elevated. Optimal level < 1.9.   This is complex condition associated with genetics, ectopic fat and lifestyle factors. Insulin  resistance may result in increased fat storage, inhibition of the breakdown of fat, cause fluctuations in blood sugar leading to energy crashes and increased cravings for sugary or high carb foods and cause metabolic slowdown making it difficult to lose weight.  This may result in additional weight gain and lead to pre-diabetes and diabetes if untreated. In addition, hyperinsulinemia increases cardiovascular risk, chronic inflammatory response and may increase the risk of obesity related malignancies.  Lab Results  Component Value Date   HGBA1C 5.6 08/04/2023   Lab Results  Component Value Date   INSULIN  11.7 08/04/2023   INSULIN  9.9 08/12/2020   Lab Results  Component Value Date   GLUCOSE 108 (H) 03/17/2023   GLUCOSE 100 (H) 07/20/2018    We reviewed treatment options which include losing 7 to 10% of body weight, increasing volume of physical activity and maintaining a diet low in saturated fats and with a low glycemic load.  Patient has also been educated on the carb insulin  model of obesity.  She may also be a candidate for pharmacoprophylaxis with metformin and / or GLP1 medication.         Follow-up Plans to travel to [place] and [place], which may involve increased physical activity. Discussed maintaining dietary habits during travel. - Schedule follow-up appointment in four weeks after return from travel        Objective   Physical Exam:  Blood pressure 133/79, pulse 72, temperature 97.9 F (36.6 C), height 5' 7 (1.702 m), weight 258 lb (117 kg), SpO2 97%. Body mass index is 40.41 kg/m.  General: She is overweight, cooperative, alert, well developed, and in no acute distress. PSYCH: Has normal mood, affect and thought process.   HEENT: EOMI, sclerae are anicteric. Lungs: Normal breathing effort, no conversational  dyspnea. Extremities: No edema.  Neurologic: No gross sensory or motor deficits. No tremors or fasciculations noted.    Diagnostic Data Reviewed:  BMET    Component Value Date/Time   NA 142 03/17/2023 0823   K 4.3 03/17/2023 0823   CL 107 03/17/2023 0823   CO2 28 03/17/2023 0823   GLUCOSE 108 (H) 03/17/2023 0823   BUN 15 03/17/2023 0823   CREATININE 0.58 03/17/2023 0823   CALCIUM 9.7 03/17/2023 0823   GFRNONAA >60 12/15/2020 1901   GFRNONAA 98 08/26/2020 1607   GFRAA 114 08/26/2020 1607   Lab Results  Component Value Date   HGBA1C 5.6 08/04/2023   HGBA1C 5.4 06/16/2020   Lab Results  Component Value Date   INSULIN  11.7 08/04/2023   INSULIN  9.9 08/12/2020   Lab Results  Component Value Date  TSH 1.580 08/04/2023   CBC    Component Value Date/Time   WBC 6.5 03/17/2023 0823   RBC 4.54 03/17/2023 0823   HGB 13.5 03/17/2023 0823   HCT 40.2 03/17/2023 0823   PLT 209 03/17/2023 0823   MCV 88.5 03/17/2023 0823   MCH 29.7 03/17/2023 0823   MCHC 33.6 03/17/2023 0823   RDW 12.7 03/17/2023 0823   Iron Studies    Component Value Date/Time   IRON 116 04/16/2020 1227   FERRITIN 94.8 04/16/2020 1227   IRONPCTSAT 32.6 04/16/2020 1227   Lipid Panel     Component Value Date/Time   CHOL 177 08/04/2023 0853   TRIG 97 08/04/2023 0853   HDL 70 08/04/2023 0853   CHOLHDL 3 05/12/2022 0726   VLDL 18.6 05/12/2022 0726   LDLCALC 90 08/04/2023 0853   Hepatic Function Panel     Component Value Date/Time   PROT 6.4 03/17/2023 0823   ALBUMIN 3.9 05/12/2022 0726   AST 15 03/17/2023 0823   ALT 22 03/17/2023 0823   ALKPHOS 75 05/12/2022 0726   BILITOT 0.5 03/17/2023 0823   BILIDIR 0.1 12/22/2020 0753   IBILI 0.3 12/22/2020 0753      Component Value Date/Time   TSH 1.580 08/04/2023 0853   Nutritional Lab Results  Component Value Date   VD25OH 19.0 (L) 08/04/2023   VD25OH 19.4 (L) 08/12/2020    Medications: Outpatient Encounter Medications as of 08/17/2023   Medication Sig   acetaminophen  (TYLENOL ) 325 MG tablet Take 650 mg by mouth every 6 (six) hours as needed.   albuterol  (VENTOLIN  HFA) 108 (90 Base) MCG/ACT inhaler Inhale 2 puffs into the lungs every 6 (six) hours as needed for wheezing or shortness of breath.   amitriptyline  (ELAVIL ) 50 MG tablet TAKE 1 TABLET BY MOUTH AT BEDTIME AS NEEDED FOR SLEEP   beclomethasone (QVAR  REDIHALER) 40 MCG/ACT inhaler Inhale 2 puffs into the lungs 2 (two) times daily. Rinse mouth out after use. (Patient taking differently: Inhale 2 puffs into the lungs as needed. Rinse mouth out after use.)   fluticasone (FLONASE) 50 MCG/ACT nasal spray Place into both nostrils as needed for allergies or rhinitis.   folic acid  (FOLVITE ) 1 MG tablet Take 2 tablets (2 mg total) by mouth daily.   hydroxychloroquine  (PLAQUENIL ) 200 MG tablet Take 1 tablet (200 mg total) by mouth daily.   MELATONIN PO Take 10 mg by mouth at bedtime.   Methotrexate , PF, (OTREXUP ) 25 MG/0.4ML SOAJ Inject 25 mg into the skin once a week.   polyethylene glycol (MIRALAX  / GLYCOLAX ) 17 g packet Take 17 g by mouth daily as needed.   Vitamin D , Ergocalciferol , (DRISDOL ) 1.25 MG (50000 UNIT) CAPS capsule Take 1 capsule (50,000 Units total) by mouth every 7 (seven) days.   No facility-administered encounter medications on file as of 08/17/2023.     Follow-Up   Return in about 4 weeks (around 09/14/2023) for For Weight Mangement with Dr. Allie Area.Aaron Aas She was informed of the importance of frequent follow up visits to maximize her success with intensive lifestyle modifications for her multiple health conditions.  Attestation Statement   Reviewed by clinician on day of visit: allergies, medications, problem list, medical history, surgical history, family history, social history, and previous encounter notes.   I have spent 41 minutes in the care of the patient today including: 2 minutes before the visit reviewing and preparing the chart. 32 minutes  face-to-face assessing and reviewing listed medical problems as outlined in obesity care plan, providing  nutritional and behavioral counseling on topics outlined in the obesity care plan, counseling regarding anti-obesity medication as outlined in obesity care plan, independently interpreting test results and goals of care, as described in assessment and plan, reviewing and discussing biometric information and progress, and ordering medications - see orders 7 minutes after the visit updating chart and documentation of encounter.    Ladd Picker, MD

## 2023-08-17 NOTE — Assessment & Plan Note (Signed)
 Lost six pounds in two weeks, which is on the high end of expected weight loss. Follows a 1500 calorie meal plan 70-80% of the time, consumes more whole foods, maintains adequate hydration, and denies skipping meals. Not currently exercising due to back problems but plans to resume gently. Muscle mass has increased, and fat mass has decreased by eight pounds. Visceral fat has decreased from 17 to 16. Appetite is well-controlled with current dietary habits. Discussed potential use of Qsymia for appetite suppression. Qsymia combines phentermine and topiramate, is FDA approved for chronic use, and may aid in appetite suppression.  Risk and common side effects were discussed. - Continue 1500 calorie meal plan - Encourage resumption of gentle exercise - Monitor weight and body composition - Research Qsymia and consider its use  General Health Maintenance Cholesterol profile is excellent with LDL at 90 and HDL at 70. Cardiovascular risk is low at 2.7%. Blood pressure is slightly elevated  at 133/79 but expected to improve with weight loss. Cancer screenings discussed, including breast, cervical, and colon cancer. Due for a Pap smear and colonoscopy next year. - Schedule Pap smear due to partial cervix remaining - Plan colonoscopy for November 2026 - Encourage regular breast cancer screening

## 2023-08-17 NOTE — Assessment & Plan Note (Signed)
 Patient is on amitriptyline  and has been on medication for several years.  This medication may contribute to weight gain and may make weight loss difficult.  She will work with prescribing physician to find weight neutral alternative.

## 2023-08-17 NOTE — Assessment & Plan Note (Signed)
 Based on ultrasound from 2022.  Most recent liver enzymes and platelet count are within normal limits.   Fibrosis 4 Score = .92 (Low risk)        Interpretation for patients with NAFLD          <1.30       -  F0-F1 (Low risk)          1.30-2.67 -  Indeterminate           >2.67      -  F3-F4 (High risk)     Validated for ages 61-65       Losing 15% of body weight may improve condition. She will work on reducing saturated fats simple carbs and diet.  Does not have coverage for GLP-1

## 2023-08-17 NOTE — Assessment & Plan Note (Addendum)
 Her HOMA-IR is 2.48 which is mildly elevated. Optimal level < 1.9.   This is complex condition associated with genetics, ectopic fat and lifestyle factors. Insulin  resistance may result in increased fat storage, inhibition of the breakdown of fat, cause fluctuations in blood sugar leading to energy crashes and increased cravings for sugary or high carb foods and cause metabolic slowdown making it difficult to lose weight.  This may result in additional weight gain and lead to pre-diabetes and diabetes if untreated. In addition, hyperinsulinemia increases cardiovascular risk, chronic inflammatory response and may increase the risk of obesity related malignancies.  Lab Results  Component Value Date   HGBA1C 5.6 08/04/2023   Lab Results  Component Value Date   INSULIN  11.7 08/04/2023   INSULIN  9.9 08/12/2020   Lab Results  Component Value Date   GLUCOSE 108 (H) 03/17/2023   GLUCOSE 100 (H) 07/20/2018    We reviewed treatment options which include losing 7 to 10% of body weight, increasing volume of physical activity and maintaining a diet low in saturated fats and with a low glycemic load.  Patient has also been educated on the carb insulin  model of obesity.  She may also be a candidate for pharmacoprophylaxis with metformin and / or GLP1 medication.

## 2023-08-17 NOTE — Assessment & Plan Note (Signed)
 Most recent vitamin D  levels  Lab Results  Component Value Date   VD25OH 19.0 (L) 08/04/2023   VD25OH 19.4 (L) 08/12/2020     Deficiency state associated with adiposity and may result in leptin resistance, weight gain and fatigue.   Plan: After discussion of benefits, alternative treatment options and side effects patient will be started on vitamin D2 50,000 units 1 tablet weekly for 3-4 months. for a treatment goal level of 50-60 mg/dl. Check levels at that time for response monitoring.

## 2023-08-29 ENCOUNTER — Other Ambulatory Visit: Payer: Self-pay | Admitting: Family Medicine

## 2023-08-29 ENCOUNTER — Other Ambulatory Visit (HOSPITAL_BASED_OUTPATIENT_CLINIC_OR_DEPARTMENT_OTHER): Payer: Self-pay

## 2023-08-29 MED ORDER — AMITRIPTYLINE HCL 50 MG PO TABS
50.0000 mg | ORAL_TABLET | Freq: Every evening | ORAL | 2 refills | Status: AC | PRN
  Filled 2023-08-29: qty 90, 90d supply, fill #0
  Filled 2023-11-17: qty 90, 90d supply, fill #1
  Filled 2024-02-21: qty 90, 90d supply, fill #2

## 2023-09-15 ENCOUNTER — Ambulatory Visit (INDEPENDENT_AMBULATORY_CARE_PROVIDER_SITE_OTHER): Admitting: Internal Medicine

## 2023-09-15 ENCOUNTER — Encounter (INDEPENDENT_AMBULATORY_CARE_PROVIDER_SITE_OTHER): Payer: Self-pay | Admitting: Internal Medicine

## 2023-09-15 VITALS — BP 125/85 | HR 74 | Temp 98.0°F | Ht 67.0 in | Wt 252.0 lb

## 2023-09-15 DIAGNOSIS — E66813 Obesity, class 3: Secondary | ICD-10-CM

## 2023-09-15 DIAGNOSIS — T43015D Adverse effect of tricyclic antidepressants, subsequent encounter: Secondary | ICD-10-CM | POA: Diagnosis not present

## 2023-09-15 DIAGNOSIS — Z6841 Body Mass Index (BMI) 40.0 and over, adult: Secondary | ICD-10-CM

## 2023-09-15 DIAGNOSIS — K760 Fatty (change of) liver, not elsewhere classified: Secondary | ICD-10-CM

## 2023-09-15 DIAGNOSIS — T50905A Adverse effect of unspecified drugs, medicaments and biological substances, initial encounter: Secondary | ICD-10-CM

## 2023-09-15 DIAGNOSIS — E88819 Insulin resistance, unspecified: Secondary | ICD-10-CM

## 2023-09-15 NOTE — Assessment & Plan Note (Addendum)
 Based on ultrasound from 2022.  Most recent liver enzymes and platelet count are within normal limits.   Fibrosis 4 Score = .92 (Low risk)        Interpretation for patients with NAFLD          <1.30       -  F0-F1 (Low risk)          1.30-2.67 -  Indeterminate           >2.67      -  F3-F4 (High risk)     Validated for ages 53-65       Losing 15% of body weight may improve condition.  She continues to work on reducing simple sugars and added fats in diet.

## 2023-09-15 NOTE — Assessment & Plan Note (Addendum)
 Her HOMA-IR is 2.48 which is mildly elevated. Optimal level < 1.9.   This is complex condition associated with genetics, ectopic fat and lifestyle factors. Insulin  resistance may result in increased fat storage, inhibition of the breakdown of fat, cause fluctuations in blood sugar leading to energy crashes and increased cravings for sugary or high carb foods and cause metabolic slowdown making it difficult to lose weight.  This may result in additional weight gain and lead to pre-diabetes and diabetes if untreated. In addition, hyperinsulinemia increases cardiovascular risk, chronic inflammatory response and may increase the risk of obesity related malignancies.  Lab Results  Component Value Date   HGBA1C 5.6 08/04/2023   Lab Results  Component Value Date   INSULIN  11.7 08/04/2023   INSULIN  9.9 08/12/2020   Lab Results  Component Value Date   GLUCOSE 108 (H) 03/17/2023   GLUCOSE 100 (H) 07/20/2018     She may also be a candidate for pharmacoprophylaxis with metformin and / or GLP1 medication.

## 2023-09-15 NOTE — Assessment & Plan Note (Addendum)
 She has lost 12 pounds thus far with preservation of muscle mass and reductions in SAT and VAT.  Continue with current weight management strategy.  She is potentially interested in antiobesity medications we discussed the the pros and cons of early or delayed introduction.  She would like to continue with lifestyle changes but is willing to consider treatment with diet antiobesity medications if she hits a plateau.

## 2023-09-15 NOTE — Assessment & Plan Note (Signed)
 Patient is on amitriptyline  and has been on medication for several years.  This medication may contribute to weight gain and may make weight loss difficult.  She will work with prescribing physician to find weight neutral alternative.

## 2023-09-15 NOTE — Progress Notes (Signed)
 Office: [phone]  /  Fax: [phone]  Weight Summary And Body Composition Analysis (BIA)  Vitals Temp: 98 F (36.7 C) BP: 125/85 Pulse Rate: 74 SpO2: 96 %   Anthropometric Measurements Height: 5' 7 (1.702 m) Weight: 252 lb (114.3 kg) BMI (Calculated): 39.46 Weight at Last Visit: 258 lb Weight Lost Since Last Visit: 6 lb Weight Gained Since Last Visit: 0 lb Starting Weight: 264 lb Total Weight Loss (lbs): 12 lb (5.443 kg) Peak Weight: 277 lb   Body Composition  Body Fat %: 48.1 % Fat Mass (lbs): 121.4 lbs Muscle Mass (lbs): 124.2 lbs Total Body Water (lbs): 91.6 lbs Visceral Fat Rating : 15    RMR: 1944  Today's Visit #: 3  Starting Date: 08/04/23   Subjective   Chief Complaint: Obesity  Brittany Malone is here to discuss her progress with her obesity treatment plan. She is following the Category 3 plan - 1500 kcal per day and states she is following her eating plan approximately 50-60% of the time. She states she is exercising 30 minutes 3 times per week..  Weight Progress Since Last Visit:  Since last office visit she has lost 6 pounds. She reports good adherence to reduced calorie nutritional plan. She has been working on reading food labels, not skipping meals, increasing protein intake at every meal, drinking more water, making healthier choices, reducing portion sizes, and incorporating more whole foods   Nutritional 24 HR Recall: Intake consistent with prescribed nutritional plan  Challenges affecting patient progress: none.   Orexigenic Control: Reports improved problems with appetite and hunger signals.  Reports improved problems with satiety and satiation.  Reports improved problems with eating patterns and portion control.  Denies abnormal cravings. Denies feeling deprived or restricted.   Pharmacotherapy for weight management: She is currently taking no anti-obesity medication.   Assessment and Plan   Treatment Plan For  Obesity:  Recommended Dietary Goals  [name] is currently in the action stage of change. As such, her goal is to continue weight management plan. She has agreed to: continue current plan  Behavioral Health and Counseling  We discussed the following behavioral modification strategies today: continue to work on maintaining a reduced calorie state, getting the recommended amount of protein, incorporating whole foods, making healthy choices, staying well hydrated and practicing mindfulness when eating..  Additional education and resources provided today: None  Recommended Physical Activity Goals  [name] has been advised to work up to 150 minutes of moderate intensity aerobic activity a week and strengthening exercises 2-3 times per week for cardiovascular health, weight loss maintenance and preservation of muscle mass.   She has agreed to :  continue to gradually increase the amount and intensity of exercise routine  Pharmacotherapy and Medical Interventions  Continue with current nutritional and behavioral strategies and we discussed AOM.  Associated Conditions Impacted by Obesity Treatment  Assessment & Plan Metabolic dysfunction-associated steatotic liver disease (MASLD) Based on ultrasound from 2022.  Most recent liver enzymes and platelet count are within normal limits.   Fibrosis 4 Score = .92 (Low risk)        Interpretation for patients with NAFLD          <1.30       -  F0-F1 (Low risk)          1.30-2.67 -  Indeterminate           >2.67      -  F3-F4 (High risk)     Validated for ages  35-65       Losing 15% of body weight may improve condition.  She continues to work on reducing simple sugars and added fats in diet.  Weight gain due to medication Patient is on amitriptyline  and has been on medication for several years.  This medication may contribute to weight gain and may make weight loss difficult.  She will work with prescribing physician to find weight neutral  alternative. Insulin  resistance Her HOMA-IR is 2.48 which is mildly elevated. Optimal level < 1.9.   This is complex condition associated with genetics, ectopic fat and lifestyle factors. Insulin  resistance may result in increased fat storage, inhibition of the breakdown of fat, cause fluctuations in blood sugar leading to energy crashes and increased cravings for sugary or high carb foods and cause metabolic slowdown making it difficult to lose weight.  This may result in additional weight gain and lead to pre-diabetes and diabetes if untreated. In addition, hyperinsulinemia increases cardiovascular risk, chronic inflammatory response and may increase the risk of obesity related malignancies.  Lab Results  Component Value Date   HGBA1C 5.6 08/04/2023   Lab Results  Component Value Date   INSULIN  11.7 08/04/2023   INSULIN  9.9 08/12/2020   Lab Results  Component Value Date   GLUCOSE 108 (H) 03/17/2023   GLUCOSE 100 (H) 07/20/2018     She may also be a candidate for pharmacoprophylaxis with metformin and / or GLP1 medication.   Class 3 severe obesity with serious comorbidity and body mass index (BMI) of 40.0 to 44.9 in adult, unspecified obesity type She has lost 12 pounds thus far with preservation of muscle mass and reductions in SAT and VAT.  Continue with current weight management strategy.  She is potentially interested in antiobesity medications we discussed the the pros and cons of early or delayed introduction.  She would like to continue with lifestyle changes but is willing to consider treatment with diet antiobesity medications if she hits a plateau.      Objective   Physical Exam:  Blood pressure 125/85, pulse 74, temperature 98 F (36.7 C), height 5' 7 (1.702 m), weight 252 lb (114.3 kg), SpO2 96%. Body mass index is 39.47 kg/m.  General: She is overweight, cooperative, alert, well developed, and in no acute distress. PSYCH: Has normal mood, affect and thought  process.   HEENT: EOMI, sclerae are anicteric. Lungs: Normal breathing effort, no conversational dyspnea. Extremities: No edema.  Neurologic: No gross sensory or motor deficits. No tremors or fasciculations noted.    Diagnostic Data Reviewed:  BMET    Component Value Date/Time   NA 142 03/17/2023 0823   K 4.3 03/17/2023 0823   CL 107 03/17/2023 0823   CO2 28 03/17/2023 0823   GLUCOSE 108 (H) 03/17/2023 0823   BUN 15 03/17/2023 0823   CREATININE 0.58 03/17/2023 0823   CALCIUM 9.7 03/17/2023 0823   GFRNONAA >60 12/15/2020 1901   GFRNONAA 98 08/26/2020 1607   GFRAA 114 08/26/2020 1607   Lab Results  Component Value Date   HGBA1C 5.6 08/04/2023   HGBA1C 5.4 06/16/2020   Lab Results  Component Value Date   INSULIN  11.7 08/04/2023   INSULIN  9.9 08/12/2020   Lab Results  Component Value Date   TSH 1.580 08/04/2023   CBC    Component Value Date/Time   WBC 6.5 03/17/2023 0823   RBC 4.54 03/17/2023 0823   HGB 13.5 03/17/2023 0823   HCT 40.2 03/17/2023 0823   PLT 209 03/17/2023 0823  MCV 88.5 03/17/2023 0823   MCH 29.7 03/17/2023 0823   MCHC 33.6 03/17/2023 0823   RDW 12.7 03/17/2023 0823   Iron Studies    Component Value Date/Time   IRON 116 04/16/2020 1227   FERRITIN 94.8 04/16/2020 1227   IRONPCTSAT 32.6 04/16/2020 1227   Lipid Panel     Component Value Date/Time   CHOL 177 08/04/2023 0853   TRIG 97 08/04/2023 0853   HDL 70 08/04/2023 0853   CHOLHDL 3 05/12/2022 0726   VLDL 18.6 05/12/2022 0726   LDLCALC 90 08/04/2023 0853   Hepatic Function Panel     Component Value Date/Time   PROT 6.4 03/17/2023 0823   ALBUMIN 3.9 05/12/2022 0726   AST 15 03/17/2023 0823   ALT 22 03/17/2023 0823   ALKPHOS 75 05/12/2022 0726   BILITOT 0.5 03/17/2023 0823   BILIDIR 0.1 12/22/2020 0753   IBILI 0.3 12/22/2020 0753      Component Value Date/Time   TSH 1.580 08/04/2023 0853   Nutritional Lab Results  Component Value Date   VD25OH 19.0 (L) 08/04/2023    VD25OH 19.4 (L) 08/12/2020    Medications: Outpatient Encounter Medications as of 09/15/2023  Medication Sig   acetaminophen  (TYLENOL ) 325 MG tablet Take 650 mg by mouth every 6 (six) hours as needed.   albuterol  (VENTOLIN  HFA) 108 (90 Base) MCG/ACT inhaler Inhale 2 puffs into the lungs every 6 (six) hours as needed for wheezing or shortness of breath.   amitriptyline  (ELAVIL ) 50 MG tablet TAKE 1 TABLET BY MOUTH AT BEDTIME AS NEEDED FOR SLEEP   beclomethasone (QVAR  REDIHALER) 40 MCG/ACT inhaler Inhale 2 puffs into the lungs 2 (two) times daily. Rinse mouth out after use. (Patient taking differently: Inhale 2 puffs into the lungs as needed. Rinse mouth out after use.)   fluticasone (FLONASE) 50 MCG/ACT nasal spray Place into both nostrils as needed for allergies or rhinitis.   folic acid  (FOLVITE ) 1 MG tablet Take 2 tablets (2 mg total) by mouth daily.   hydroxychloroquine  (PLAQUENIL ) 200 MG tablet Take 1 tablet (200 mg total) by mouth daily.   MELATONIN PO Take 10 mg by mouth at bedtime.   Methotrexate , PF, (OTREXUP ) 25 MG/0.4ML SOAJ Inject 25 mg into the skin once a week.   polyethylene glycol (MIRALAX  / GLYCOLAX ) 17 g packet Take 17 g by mouth daily as needed.   Vitamin D , Ergocalciferol , (DRISDOL ) 1.25 MG (50000 UNIT) CAPS capsule Take 1 capsule (50,000 Units total) by mouth every 7 (seven) days.   No facility-administered encounter medications on file as of 09/15/2023.     Follow-Up   Return in about 3 weeks (around 10/06/2023) for For Weight Mangement with Dr. Francyne.SABRA She was informed of the importance of frequent follow up visits to maximize her success with intensive lifestyle modifications for her multiple health conditions.  Attestation Statement   Reviewed by clinician on day of visit: allergies, medications, problem list, medical history, surgical history, family history, social history, and previous encounter notes.     Lucas Francyne, MD

## 2023-09-15 NOTE — Progress Notes (Deleted)
 Office Visit Note  Patient: Brittany Malone             Date of Birth: 09-22-1965           MRN: [id number]             PCP: Frann Mabel Mt, DO Referring: Frann Mabel Mt* Visit Date: 09/28/2023 Occupation: @GUAROCC @  Subjective:  No chief complaint on file.   History of Present Illness: Brittany Malone is a 58 y.o. female ***     Activities of Daily Living:  Patient reports morning stiffness for *** {minute/hour:19697}.   Patient {ACTIONS;DENIES/REPORTS:21021675::Denies} nocturnal pain.  Difficulty dressing/grooming: {ACTIONS;DENIES/REPORTS:21021675::Denies} Difficulty climbing stairs: {ACTIONS;DENIES/REPORTS:21021675::Denies} Difficulty getting out of chair: {ACTIONS;DENIES/REPORTS:21021675::Denies} Difficulty using hands for taps, buttons, cutlery, and/or writing: {ACTIONS;DENIES/REPORTS:21021675::Denies}  No Rheumatology ROS completed.   PMFS History:  Patient Active Problem List   Diagnosis Date Noted   Insulin  resistance 08/17/2023   Vitamin D  deficiency 08/04/2023   Weight gain due to medication 08/04/2023   Obesity, unspecified 08/04/2023   Primary osteoarthritis of both knees 02/23/2023   DDD (degenerative disc disease), cervical 02/23/2023   Degeneration of intervertebral disc of lumbar region without discogenic back pain or lower extremity pain 02/23/2023   Metabolic dysfunction-associated steatotic liver disease (MASLD)    GERD (gastroesophageal reflux disease)    Rheumatoid arthritis involving multiple sites with positive rheumatoid factor (HCC) 08/15/2018   Family history of rheumatoid arthritis 08/15/2018   Chronic uveitis of both eyes 08/15/2018   Pes cavus 08/15/2018   History of gastroesophageal reflux (GERD) 08/15/2018    Past Medical History:  Diagnosis Date   Allergy    SEASONAL   Anemia    Arthritis    Back pain    Back pain    Chronic vertigo    Degenerative joint disease of cervical and lumbar spine     Diverticulitis    Fatty liver    per patient   GERD (gastroesophageal reflux disease)    Insomnia    Joint pain    Joint pain    NAFLD (nonalcoholic fatty liver disease)    Osteoarthritis    Rheumatoid arthritis (HCC)     Family History  Problem Relation Age of Onset   Lupus Mother    Fibromyalgia Mother    Hyperlipidemia Mother    Hypertension Father    High Cholesterol Father    Hyperlipidemia Father    Colon polyps Father    Kidney Stones Brother    Healthy Son    Hypertension Daughter    Migraines Daughter    Colon cancer Neg Hx    Esophageal cancer Neg Hx    Rectal cancer Neg Hx    Stomach cancer Neg Hx    Past Surgical History:  Procedure Laterality Date   ABDOMINAL HYSTERECTOMY     ANKLE SURGERY     COLONOSCOPY  01/18/2020   UPPER GI ENDOSCOPY     Social History   Social History Narrative   Not on file   Immunization History  Administered Date(s) Administered   Influenza,inj,Quad PF,6+ Mos 11/16/2018   Influenza-Unspecified 01/13/2022, 12/25/2022   Moderna Sars-Covid-2 Vaccination 03/08/2019, 04/05/2019, 11/23/2019, 06/02/2020, 03/21/2021   Pfizer Covid-19 Vaccine Bivalent Booster 5y-11y 12/25/2022   Pfizer(Comirnaty)Fall Seasonal Vaccine 12 years and older 01/13/2022   Pneumococcal Polysaccharide-23 11/16/2018   Tdap 05/16/2010, 06/16/2020   Zoster Recombinant(Shingrix ) 11/16/2018, 01/16/2019     Objective: Vital Signs: There were no vitals taken for this visit.   Physical Exam   Musculoskeletal Exam: ***  CDAI Exam: CDAI Score: -- Patient Global: --; Provider Global: -- Swollen: --; Tender: -- Joint Exam 09/28/2023   No joint exam has been documented for this visit   There is currently no information documented on the homunculus. Go to the Rheumatology activity and complete the homunculus joint exam.  Investigation: No additional findings.  Imaging: No results found.  Recent Labs: Lab Results  Component Value Date   WBC 6.5  03/17/2023   HGB 13.5 03/17/2023   PLT 209 03/17/2023   NA 142 03/17/2023   K 4.3 03/17/2023   CL 107 03/17/2023   CO2 28 03/17/2023   GLUCOSE 108 (H) 03/17/2023   BUN 15 03/17/2023   CREATININE 0.58 03/17/2023   BILITOT 0.5 03/17/2023   ALKPHOS 75 05/12/2022   AST 15 03/17/2023   ALT 22 03/17/2023   PROT 6.4 03/17/2023   ALBUMIN 3.9 05/12/2022   CALCIUM 9.7 03/17/2023   GFRAA 114 08/26/2020   QFTBGOLDPLUS NEGATIVE 11/20/2019    Speciality Comments: PLQ Eye Exam: 10/19/2022 WNL with Pre-existing Toxo Scars @ [facility] Follow up 1 year  Procedures:  No procedures performed Allergies: Penicillins   Assessment / Plan:     Visit Diagnoses: No diagnosis found.  Orders: No orders of the defined types were placed in this encounter.  No orders of the defined types were placed in this encounter.   Face-to-face time spent with patient was *** minutes. Greater than 50% of time was spent in counseling and coordination of care.  Follow-Up Instructions: No follow-ups on file.   Maya Nash, MD  Note - This record has been created using Animal nutritionist.  Chart creation errors have been sought, but may not always  have been located. Such creation errors do not reflect on  the standard of medical care.

## 2023-09-15 NOTE — Progress Notes (Deleted)
 Office: [phone]  /  Fax: [phone]  Weight Summary and Body Composition Analysis (BIA)  Vitals Temp: 98 F (36.7 C) BP: 125/85 Pulse Rate: 74 SpO2: 96 %   Anthropometric Measurements Height: 5' 7 (1.702 m) Weight: 252 lb (114.3 kg) BMI (Calculated): 39.46 Weight at Last Visit: 258 lb Weight Lost Since Last Visit: 6 lb Weight Gained Since Last Visit: 0 lb Starting Weight: 264 lb Total Weight Loss (lbs): 12 lb (5.443 kg) Peak Weight: 277 lb   Body Composition  Body Fat %: 48.1 % Fat Mass (lbs): 121.4 lbs Muscle Mass (lbs): 124.2 lbs Total Body Water (lbs): 91.6 lbs Visceral Fat Rating : 15    RMR: 1944  Today's Visit #: 3  Starting Date: 08/04/23   Subjective   Chief Complaint: Obesity  Interval History ***  Challenges affecting patient progress: {EMOBESITYBARRIERS:28841::none}.    Pharmacotherapy for weight management: Brittany Malone is currently taking {EMPharmaco:28845}.   Assessment and Plan   Treatment Plan For Obesity:  Recommended Dietary Goals  Brittany Malone is currently in the action stage of change. As such, her goal is to continue weight management plan. Brittany Malone has agreed to: {EMWTLOSSPLAN:29297::continue current plan}  Behavioral Health and Counseling  We discussed the following behavioral modification strategies today: {EMWMwtlossstrategies:28914::continue to work on maintaining a reduced calorie state, getting the recommended amount of protein, incorporating whole foods, making healthy choices, staying well hydrated and practicing mindfulness when eating.}.  Additional education and resources provided today: {EMadditionalresources:29169::None}  Recommended Physical Activity Goals  Brittany Malone has been advised to work up to 150 minutes of moderate intensity aerobic activity a week and strengthening exercises 2-3 times per week for cardiovascular health, weight loss maintenance and preservation of muscle mass.   Brittany Malone has agreed to :   {EMEXERCISE:28847::Think about enjoyable ways to increase daily physical activity and overcoming barriers to exercise,Increase physical activity in their day and reduce sedentary time (increase NEAT).}  Medical Interventions and Pharmacotherapy  We discussed various medication options to help Brittany Malone with her weight loss efforts and we both agreed to : {EMagreedrx:29170}  Associated Conditions Impacted by Obesity Treatment  Assessment & Plan     ***  Objective   Physical Exam:  Blood pressure 125/85, pulse 74, temperature 98 F (36.7 C), height 5' 7 (1.702 m), weight 252 lb (114.3 kg), SpO2 96%. Body mass index is 39.47 kg/m.  General: Brittany Malone is overweight, cooperative, alert, well developed, and in no acute distress. PSYCH: Has normal mood, affect and thought process.   HEENT: EOMI, sclerae are anicteric. Lungs: Normal breathing effort, no conversational dyspnea. Extremities: No edema.  Neurologic: No gross sensory or motor deficits. No tremors or fasciculations noted.    Diagnostic Data Reviewed:  BMET    Component Value Date/Time   NA 142 03/17/2023 0823   K 4.3 03/17/2023 0823   CL 107 03/17/2023 0823   CO2 28 03/17/2023 0823   GLUCOSE 108 (H) 03/17/2023 0823   BUN 15 03/17/2023 0823   CREATININE 0.58 03/17/2023 0823   CALCIUM 9.7 03/17/2023 0823   GFRNONAA >60 12/15/2020 1901   GFRNONAA 98 08/26/2020 1607   GFRAA 114 08/26/2020 1607   Lab Results  Component Value Date   HGBA1C 5.6 08/04/2023   HGBA1C 5.4 06/16/2020   Lab Results  Component Value Date   INSULIN  11.7 08/04/2023   INSULIN  9.9 08/12/2020   Lab Results  Component Value Date   TSH 1.580 08/04/2023   CBC    Component Value Date/Time   WBC 6.5 03/17/2023  0823   RBC 4.54 03/17/2023 0823   HGB 13.5 03/17/2023 0823   HCT 40.2 03/17/2023 0823   PLT 209 03/17/2023 0823   MCV 88.5 03/17/2023 0823   MCH 29.7 03/17/2023 0823   MCHC 33.6 03/17/2023 0823   RDW 12.7 03/17/2023 0823   Iron  Studies    Component Value Date/Time   IRON 116 04/16/2020 1227   FERRITIN 94.8 04/16/2020 1227   IRONPCTSAT 32.6 04/16/2020 1227   Lipid Panel     Component Value Date/Time   CHOL 177 08/04/2023 0853   TRIG 97 08/04/2023 0853   HDL 70 08/04/2023 0853   CHOLHDL 3 05/12/2022 0726   VLDL 18.6 05/12/2022 0726   LDLCALC 90 08/04/2023 0853   Hepatic Function Panel     Component Value Date/Time   PROT 6.4 03/17/2023 0823   ALBUMIN 3.9 05/12/2022 0726   AST 15 03/17/2023 0823   ALT 22 03/17/2023 0823   ALKPHOS 75 05/12/2022 0726   BILITOT 0.5 03/17/2023 0823   BILIDIR 0.1 12/22/2020 0753   IBILI 0.3 12/22/2020 0753      Component Value Date/Time   TSH 1.580 08/04/2023 0853   Nutritional Lab Results  Component Value Date   VD25OH 19.0 (L) 08/04/2023   VD25OH 19.4 (L) 08/12/2020    Medications: Outpatient Encounter Medications as of 09/15/2023  Medication Sig   acetaminophen  (TYLENOL ) 325 MG tablet Take 650 mg by mouth every 6 (six) hours as needed.   albuterol  (VENTOLIN  HFA) 108 (90 Base) MCG/ACT inhaler Inhale 2 puffs into the lungs every 6 (six) hours as needed for wheezing or shortness of breath.   amitriptyline  (ELAVIL ) 50 MG tablet TAKE 1 TABLET BY MOUTH AT BEDTIME AS NEEDED FOR SLEEP   beclomethasone (QVAR  REDIHALER) 40 MCG/ACT inhaler Inhale 2 puffs into the lungs 2 (two) times daily. Rinse mouth out after use. (Patient taking differently: Inhale 2 puffs into the lungs as needed. Rinse mouth out after use.)   fluticasone (FLONASE) 50 MCG/ACT nasal spray Place into both nostrils as needed for allergies or rhinitis.   folic acid  (FOLVITE ) 1 MG tablet Take 2 tablets (2 mg total) by mouth daily.   hydroxychloroquine  (PLAQUENIL ) 200 MG tablet Take 1 tablet (200 mg total) by mouth daily.   MELATONIN PO Take 10 mg by mouth at bedtime.   Methotrexate , PF, (OTREXUP ) 25 MG/0.4ML SOAJ Inject 25 mg into the skin once a week.   polyethylene glycol (MIRALAX  / GLYCOLAX ) 17 g packet  Take 17 g by mouth daily as needed.   Vitamin D , Ergocalciferol , (DRISDOL ) 1.25 MG (50000 UNIT) CAPS capsule Take 1 capsule (50,000 Units total) by mouth every 7 (seven) days.   No facility-administered encounter medications on file as of 09/15/2023.     Follow-Up   No follow-ups on file.Brittany Malone Brittany Malone was informed of the importance of frequent follow up visits to maximize her success with intensive lifestyle modifications for her multiple health conditions.  Attestation Statement   Reviewed by clinician on day of visit: allergies, medications, problem list, medical history, surgical history, family history, social history, and previous encounter notes.     Lucas Parker, MD

## 2023-09-23 ENCOUNTER — Encounter: Payer: Self-pay | Admitting: Rheumatology

## 2023-09-23 ENCOUNTER — Other Ambulatory Visit: Payer: Self-pay

## 2023-09-23 DIAGNOSIS — Z79899 Other long term (current) drug therapy: Secondary | ICD-10-CM

## 2023-09-28 ENCOUNTER — Ambulatory Visit: Payer: BC Managed Care – PPO | Admitting: Rheumatology

## 2023-09-28 DIAGNOSIS — Z8269 Family history of other diseases of the musculoskeletal system and connective tissue: Secondary | ICD-10-CM

## 2023-09-28 DIAGNOSIS — G8929 Other chronic pain: Secondary | ICD-10-CM

## 2023-09-28 DIAGNOSIS — Z79899 Other long term (current) drug therapy: Secondary | ICD-10-CM

## 2023-09-28 DIAGNOSIS — Z8719 Personal history of other diseases of the digestive system: Secondary | ICD-10-CM

## 2023-09-28 DIAGNOSIS — M170 Bilateral primary osteoarthritis of knee: Secondary | ICD-10-CM

## 2023-09-28 DIAGNOSIS — M51369 Other intervertebral disc degeneration, lumbar region without mention of lumbar back pain or lower extremity pain: Secondary | ICD-10-CM

## 2023-09-28 DIAGNOSIS — H2013 Chronic iridocyclitis, bilateral: Secondary | ICD-10-CM

## 2023-09-28 DIAGNOSIS — Q667 Congenital pes cavus, unspecified foot: Secondary | ICD-10-CM

## 2023-09-28 DIAGNOSIS — Z85820 Personal history of malignant melanoma of skin: Secondary | ICD-10-CM

## 2023-09-28 DIAGNOSIS — Z13820 Encounter for screening for osteoporosis: Secondary | ICD-10-CM

## 2023-09-28 DIAGNOSIS — M0579 Rheumatoid arthritis with rheumatoid factor of multiple sites without organ or systems involvement: Secondary | ICD-10-CM

## 2023-09-28 DIAGNOSIS — M5030 Other cervical disc degeneration, unspecified cervical region: Secondary | ICD-10-CM

## 2023-09-28 NOTE — Progress Notes (Unsigned)
 Office Visit Note  Patient: Brittany Malone             Date of Birth: [birth year]           MRN: [id number]             PCP: Frann Mabel Mt, DO Referring: Frann Mabel Mt* Visit Date: 09/29/2023 Occupation: @GUAROCC @  Subjective:  Medication monitoring  History of Present Illness: Brittany Malone is a 58 y.o. female with history of seropositive rheumatoid arthritis, uveitis, and osteoarthritis. Patient remains on Otrexup  25 mg sq injections once weekly, folic acid  2 mg po daily, and plaquenil  200 mg 1 tablet by mouth daily.  Patient requested refills of Otrexup , folic acid , Plaquenil  today.  She continues to find the current treatment regimen to be effective at managing her symptoms.  She denies any signs or symptoms of a rheumatoid arthritis flare.  Her morning stiffness has been lasting for about 30 minutes daily especially in both hands.  She has occasional aching in her feet but denies any joint swelling.  She denies any nocturnal pain or difficulty performing ADLs.  She denies any new medical conditions.  She denies any recent or recurrent infections.    Activities of Daily Living:  Patient reports morning stiffness for 30 minutes.   Patient Denies nocturnal pain.  Difficulty dressing/grooming: Denies Difficulty climbing stairs: Denies Difficulty getting out of chair: Denies Difficulty using hands for taps, buttons, cutlery, and/or writing: Denies  Review of Systems  Constitutional:  Positive for fatigue.  HENT:  Positive for mouth dryness. Negative for mouth sores.   Eyes:  Negative for dryness.  Respiratory:  Negative for shortness of breath.   Cardiovascular:  Negative for chest pain and palpitations.  Gastrointestinal:  Negative for blood in stool, constipation and diarrhea.  Endocrine: Negative for increased urination.  Genitourinary:  Negative for involuntary urination.  Musculoskeletal:  Positive for joint pain, joint pain, joint swelling and morning  stiffness. Negative for gait problem, myalgias, muscle weakness, muscle tenderness and myalgias.  Skin:  Negative for color change, rash, hair loss and sensitivity to sunlight.  Allergic/Immunologic: Negative for susceptible to infections.  Neurological:  Negative for dizziness and headaches.  Hematological:  Negative for swollen glands.  Psychiatric/Behavioral:  Positive for sleep disturbance. Negative for depressed mood. The patient is not nervous/anxious.     PMFS History:  Patient Active Problem List   Diagnosis Date Noted   Insulin  resistance 08/17/2023   Vitamin D  deficiency 08/04/2023   Weight gain due to medication 08/04/2023   Obesity, unspecified 08/04/2023   Primary osteoarthritis of both knees 02/23/2023   DDD (degenerative disc disease), cervical 02/23/2023   Degeneration of intervertebral disc of lumbar region without discogenic back pain or lower extremity pain 02/23/2023   Metabolic dysfunction-associated steatotic liver disease (MASLD)    GERD (gastroesophageal reflux disease)    Rheumatoid arthritis involving multiple sites with positive rheumatoid factor (HCC) 08/15/2018   Family history of rheumatoid arthritis 08/15/2018   Chronic uveitis of both eyes 08/15/2018   Pes cavus 08/15/2018   History of gastroesophageal reflux (GERD) 08/15/2018    Past Medical History:  Diagnosis Date   Allergy    SEASONAL   Anemia    Arthritis    Back pain    Back pain    Chronic vertigo    Degenerative joint disease of cervical and lumbar spine    Diverticulitis    Fatty liver    per patient   GERD (gastroesophageal reflux disease)  Insomnia    Joint pain    Joint pain    NAFLD (nonalcoholic fatty liver disease)    Osteoarthritis    Rheumatoid arthritis (HCC)     Family History  Problem Relation Age of Onset   Lupus Mother    Fibromyalgia Mother    Hyperlipidemia Mother    Hypertension Father    High Cholesterol Father    Hyperlipidemia Father    Colon polyps  Father    Kidney Stones Brother    Healthy Son    Hypertension Daughter    Migraines Daughter    Colon cancer Neg Hx    Esophageal cancer Neg Hx    Rectal cancer Neg Hx    Stomach cancer Neg Hx    Past Surgical History:  Procedure Laterality Date   ABDOMINAL HYSTERECTOMY     ANKLE SURGERY     COLONOSCOPY  01/18/2020   UPPER GI ENDOSCOPY     Social History   Social History Narrative   Not on file   Immunization History  Administered Date(s) Administered   Influenza,inj,Quad PF,6+ Mos 11/16/2018   Influenza-Unspecified 01/13/2022, 12/25/2022   Moderna Sars-Covid-2 Vaccination 03/08/2019, 04/05/2019, 11/23/2019, 06/02/2020, 03/21/2021   Pfizer Covid-19 Vaccine Bivalent Booster 5y-11y 12/25/2022   Pfizer(Comirnaty)Fall Seasonal Vaccine 12 years and older 01/13/2022   Pneumococcal Polysaccharide-23 11/16/2018   Tdap 05/16/2010, 06/16/2020   Zoster Recombinant(Shingrix ) 11/16/2018, 01/16/2019     Objective: Vital Signs: BP 126/78 (BP Location: Left Arm, Patient Position: Sitting, Cuff Size: Normal)   Pulse 81   Resp 15   Ht 5' 7.5 (1.715 m)   Wt 249 lb 12.8 oz (113.3 kg)   BMI 38.55 kg/m    Physical Exam Vitals and nursing note reviewed.  Constitutional:      Appearance: She is well-developed.  HENT:     Head: Normocephalic and atraumatic.  Eyes:     Conjunctiva/sclera: Conjunctivae normal.  Cardiovascular:     Rate and Rhythm: Normal rate and regular rhythm.     Heart sounds: Normal heart sounds.  Pulmonary:     Effort: Pulmonary effort is normal.     Breath sounds: Normal breath sounds.  Abdominal:     General: Bowel sounds are normal.     Palpations: Abdomen is soft.  Musculoskeletal:     Cervical back: Normal range of motion.  Lymphadenopathy:     Cervical: No cervical adenopathy.  Skin:    General: Skin is warm and dry.     Capillary Refill: Capillary refill takes less than 2 seconds.  Neurological:     Mental Status: She is alert and oriented to  person, place, and time.  Psychiatric:        Behavior: Behavior normal.      Musculoskeletal Exam: C-spine has limited range of motion with lateral rotation.  Limited mobility of the lumbar spine.  Shoulder joints, elbow joints, wrist joints, MCPs, PIPs, DIPs have good range of motion with no synovitis.  Complete fist formation bilaterally.  Hip joints have good range of motion with no groin pain.  Knee joints have good range of motion no warmth or effusion.  Ankle joints have good range of motion with no tenderness or joint swelling.  No tenderness of MTP joints.  Pes cavus noted bilaterally.  CDAI Exam: CDAI Score: -- Patient Global: --; Provider Global: -- Swollen: --; Tender: -- Joint Exam 09/29/2023   No joint exam has been documented for this visit   There is currently no information documented on  the homunculus. Go to the Rheumatology activity and complete the homunculus joint exam.  Investigation: No additional findings.  Imaging: No results found.  Recent Labs: Lab Results  Component Value Date   WBC 6.5 03/17/2023   HGB 13.5 03/17/2023   PLT 209 03/17/2023   NA 142 03/17/2023   K 4.3 03/17/2023   CL 107 03/17/2023   CO2 28 03/17/2023   GLUCOSE 108 (H) 03/17/2023   BUN 15 03/17/2023   CREATININE 0.58 03/17/2023   BILITOT 0.5 03/17/2023   ALKPHOS 75 05/12/2022   AST 15 03/17/2023   ALT 22 03/17/2023   PROT 6.4 03/17/2023   ALBUMIN 3.9 05/12/2022   CALCIUM 9.7 03/17/2023   GFRAA 114 08/26/2020   QFTBGOLDPLUS NEGATIVE 11/20/2019    Speciality Comments: PLQ Eye Exam: 10/19/2022 WNL with Pre-existing Toxo Scars @ [facility] Follow up 1 year  Procedures:  No procedures performed Allergies: Penicillins   Assessment / Plan:     Visit Diagnoses: Rheumatoid arthritis involving multiple sites with positive rheumatoid factor (HCC): She has no joint tenderness or synovitis on examination today.  She experiences morning stiffness lasting about 30  minutes in both hands.  She is occasional aching in both feet but has not noticed any joint swelling.  She has not any difficulty performing ADLs.  No nocturnal pain.  Overall her symptoms remain stable on Otrexup  25 mg sq injections once weekly, folic acid  2 mg daily, and Plaquenil  200 mg 1 tablet by mouth daily.  She is tolerating combination therapy without any side effects.  No recent or recurrent infections.  No medication changes will be made at this time.  Refills of folic acid , Plaquenil , and Otrexup  will be sent to the pharmacy today.  She was advised to notify us  if she develops any signs or symptoms of a flare.  She will follow-up in the office in 5 months or sooner if needed.  High risk medication use -Otrexup  25 mg sq  injections once weekly, Plaquenil  200 mg 1 tablet by mouth daily, and folic acid  2 mg daily. CBC and CMP updated on 03/17/23.  Orders for CBC and CMP were released today. Lipid panel updated on 08/04/23.  PLQ Eye Exam: 10/19/2022 WNL with Pre-existing Toxo Scars @ [facility] Follow up 1 year.  Patient has upcoming Plaquenil  examination scheduled in August 2025.  She was given a new Plaquenil  examination form to take with her to her upcoming appointment. No recent or recurrent infections. Discussed the importance of holding otrexup  if she develops signs or symptoms of an infection and to resume once the infection has completely cleared.   Plan: CBC with Differential/Platelet, Comprehensive metabolic panel with GFR  Chronic uveitis of both eyes: No signs or symptoms of uveitis flare.  She has not had any eye pain.  No conjunctival injection noted.  She will remain on the current treatment regimen.  Primary osteoarthritis of both knees: She has good range of motion of both knee joints on examination today.  No warmth or effusion noted.  Chronic SI joint pain: She continues to have intermittent discomfort in her lower back.  Pes cavus: She experiences intermittent  aching in both feet.  She wears proper fitting shoes.  DDD (degenerative disc disease), cervical: C-spine has limited range of motion with lateral rotation.  No symptoms of radiculopathy at this time.  Degeneration of intervertebral disc of lumbar region without discogenic back pain or lower extremity pain: Chronic pain.  She had an ablation performed  in May 2025.  Other medical conditions are listed as follows:  Family history of systemic lupus erythematosus (SLE) in mother  History of melanoma  History of gastroesophageal reflux (GERD)  History of diverticulosis  Orders: Orders Placed This Encounter  Procedures   CBC with Differential/Platelet   Comprehensive metabolic panel with GFR   No orders of the defined types were placed in this encounter.   Follow-Up Instructions: Return in about 5 months (around 02/29/2024) for Rheumatoid arthritis.   Waddell CHRISTELLA Craze, PA-C  Note - This record has been created using Dragon software.  Chart creation errors have been sought, but may not always  have been located. Such creation errors do not reflect on  the standard of medical care.

## 2023-09-29 ENCOUNTER — Other Ambulatory Visit (HOSPITAL_BASED_OUTPATIENT_CLINIC_OR_DEPARTMENT_OTHER): Payer: Self-pay

## 2023-09-29 ENCOUNTER — Encounter: Payer: Self-pay | Admitting: Physician Assistant

## 2023-09-29 ENCOUNTER — Ambulatory Visit: Attending: Physician Assistant | Admitting: Physician Assistant

## 2023-09-29 ENCOUNTER — Other Ambulatory Visit: Payer: Self-pay | Admitting: *Deleted

## 2023-09-29 VITALS — BP 126/78 | HR 81 | Resp 15 | Ht 67.5 in | Wt 249.8 lb

## 2023-09-29 DIAGNOSIS — M0579 Rheumatoid arthritis with rheumatoid factor of multiple sites without organ or systems involvement: Secondary | ICD-10-CM

## 2023-09-29 DIAGNOSIS — M170 Bilateral primary osteoarthritis of knee: Secondary | ICD-10-CM | POA: Diagnosis not present

## 2023-09-29 DIAGNOSIS — H2013 Chronic iridocyclitis, bilateral: Secondary | ICD-10-CM

## 2023-09-29 DIAGNOSIS — Z8269 Family history of other diseases of the musculoskeletal system and connective tissue: Secondary | ICD-10-CM

## 2023-09-29 DIAGNOSIS — Z79899 Other long term (current) drug therapy: Secondary | ICD-10-CM | POA: Diagnosis not present

## 2023-09-29 DIAGNOSIS — M51369 Other intervertebral disc degeneration, lumbar region without mention of lumbar back pain or lower extremity pain: Secondary | ICD-10-CM

## 2023-09-29 DIAGNOSIS — Q667 Congenital pes cavus, unspecified foot: Secondary | ICD-10-CM

## 2023-09-29 DIAGNOSIS — Z8719 Personal history of other diseases of the digestive system: Secondary | ICD-10-CM

## 2023-09-29 DIAGNOSIS — G8929 Other chronic pain: Secondary | ICD-10-CM

## 2023-09-29 DIAGNOSIS — M5030 Other cervical disc degeneration, unspecified cervical region: Secondary | ICD-10-CM

## 2023-09-29 DIAGNOSIS — Z85820 Personal history of malignant melanoma of skin: Secondary | ICD-10-CM

## 2023-09-29 DIAGNOSIS — M533 Sacrococcygeal disorders, not elsewhere classified: Secondary | ICD-10-CM

## 2023-09-29 MED ORDER — FOLIC ACID 1 MG PO TABS
2.0000 mg | ORAL_TABLET | Freq: Every day | ORAL | 3 refills | Status: AC
  Filled 2023-09-29: qty 180, 90d supply, fill #0
  Filled 2024-02-02: qty 180, 90d supply, fill #1

## 2023-09-29 MED ORDER — HYDROXYCHLOROQUINE SULFATE 200 MG PO TABS
200.0000 mg | ORAL_TABLET | Freq: Every day | ORAL | 0 refills | Status: AC
  Filled 2023-09-29: qty 90, 90d supply, fill #0

## 2023-09-29 NOTE — Patient Instructions (Signed)
 Standing Labs We placed an order today for your standing lab work.   Please have your standing labs drawn in early November and every 3 months   Please have your labs drawn 2 weeks prior to your appointment so that the provider can discuss your lab results at your appointment, if possible.  Please note that you may see your imaging and lab results in MyChart before we have reviewed them. We will contact you once all results are reviewed. Please allow our office up to 72 hours to thoroughly review all of the results before contacting the office for clarification of your results.  WALK-IN LAB HOURS  Monday through Thursday from 8:00 am -12:30 pm and 1:00 pm-4:30 pm and Friday from 8:00 am-12:00 pm.  Patients with office visits requiring labs will be seen before walk-in labs.  You may encounter longer than normal wait times. Please allow additional time. Wait times may be shorter on  Monday and Thursday afternoons.  We do not book appointments for walk-in labs. We appreciate your patience and understanding with our staff.   Labs are drawn by [facility]. Please bring your co-pay at the time of your lab draw.  You may receive a bill from [facility] for your lab work.  Please note if you are on Hydroxychloroquine  and and an order has been placed for a Hydroxychloroquine  level,  you will need to have it drawn 4 hours or more after your last dose.  If you wish to have your labs drawn at another location, please call the office 24 hours in advance so we can fax the orders.  The office is located at [address], [place], [place] [address]   If you have any questions regarding directions or hours of operation,  please call [phone].   As a reminder, please drink plenty of water prior to coming for your lab work. Thanks!

## 2023-09-30 LAB — COMPREHENSIVE METABOLIC PANEL WITH GFR
AG Ratio: 1.7 (calc) (ref 1.0–2.5)
ALT: 24 U/L (ref 6–29)
AST: 22 U/L (ref 10–35)
Albumin: 4.3 g/dL (ref 3.6–5.1)
Alkaline phosphatase (APISO): 62 U/L (ref 37–153)
BUN: 19 mg/dL (ref 7–25)
CO2: 25 mmol/L (ref 20–32)
Calcium: 9.7 mg/dL (ref 8.6–10.4)
Chloride: 105 mmol/L (ref 98–110)
Creat: 0.53 mg/dL (ref 0.50–1.05)
Globulin: 2.5 g/dL (ref 1.9–3.7)
Glucose, Bld: 84 mg/dL (ref 65–99)
Potassium: 4.3 mmol/L (ref 3.5–5.3)
Sodium: 141 mmol/L (ref 135–146)
Total Bilirubin: 0.4 mg/dL (ref 0.2–1.2)
Total Protein: 6.8 g/dL (ref 6.1–8.1)
eGFR: 106 mL/min/1.73m2 (ref >=60)

## 2023-09-30 LAB — CBC WITH DIFFERENTIAL/PLATELET
Absolute Lymphocytes: 1524 {cells}/uL (ref 850–3900)
Absolute Monocytes: 528 {cells}/uL (ref 200–950)
Basophils Absolute: 28 {cells}/uL (ref 0–200)
Basophils Relative: 0.5 %
Eosinophils Absolute: 132 {cells}/uL (ref 15–500)
Eosinophils Relative: 2.4 %
HCT: 42.3 % (ref 35.0–45.0)
Hemoglobin: 13.9 g/dL (ref 11.7–15.5)
MCH: 29.7 pg (ref 27.0–33.0)
MCHC: 32.9 g/dL (ref 32.0–36.0)
MCV: 90.4 fL (ref 80.0–100.0)
MPV: 10 fL (ref 7.5–12.5)
Monocytes Relative: 9.6 %
Neutro Abs: 3289 {cells}/uL (ref 1500–7800)
Neutrophils Relative %: 59.8 %
Platelets: 205 Thousand/uL (ref 140–400)
RBC: 4.68 Million/uL (ref 3.80–5.10)
RDW: 12.7 % (ref 11.0–15.0)
Total Lymphocyte: 27.7 %
WBC: 5.5 Thousand/uL (ref 3.8–10.8)

## 2023-10-03 ENCOUNTER — Ambulatory Visit: Payer: Self-pay | Admitting: Physician Assistant

## 2023-10-17 ENCOUNTER — Ambulatory Visit (INDEPENDENT_AMBULATORY_CARE_PROVIDER_SITE_OTHER): Admitting: Internal Medicine

## 2023-10-17 ENCOUNTER — Encounter (INDEPENDENT_AMBULATORY_CARE_PROVIDER_SITE_OTHER): Payer: Self-pay | Admitting: Internal Medicine

## 2023-10-17 VITALS — BP 120/84 | HR 77 | Temp 98.5°F | Ht 67.0 in | Wt 247.0 lb

## 2023-10-17 DIAGNOSIS — T43015D Adverse effect of tricyclic antidepressants, subsequent encounter: Secondary | ICD-10-CM | POA: Diagnosis not present

## 2023-10-17 DIAGNOSIS — E66813 Obesity, class 3: Secondary | ICD-10-CM | POA: Diagnosis not present

## 2023-10-17 DIAGNOSIS — E88819 Insulin resistance, unspecified: Secondary | ICD-10-CM | POA: Diagnosis not present

## 2023-10-17 DIAGNOSIS — K760 Fatty (change of) liver, not elsewhere classified: Secondary | ICD-10-CM | POA: Diagnosis not present

## 2023-10-17 DIAGNOSIS — R635 Abnormal weight gain: Secondary | ICD-10-CM

## 2023-10-17 DIAGNOSIS — Z6841 Body Mass Index (BMI) 40.0 and over, adult: Secondary | ICD-10-CM

## 2023-10-17 NOTE — Assessment & Plan Note (Signed)
 Weight: decrease of 28 lb (10.2%) over 6 months, 3 weeks  Start: 03/23/2023 275 lb (124.7 kg)  End: 10/17/2023 247 lb (112 kg)  Obesity management is ongoing with a recent weight loss of five pounds. She is following a 1500 calorie meal plan with good compliance and reports feeling satisfied with her meals. She is not currently on weight loss medications due to insurance coverage issues but is considering options during upcoming open enrollment. Genetic testing for personalized obesity treatment is available but not covered by insurance. She is engaging in regular exercise, including swimming and low-impact activities, to accommodate back pain. Her body fat percentage has decreased from 51% to 48%, with a goal to reach 36%. - Continue 1500 calorie meal plan - Evaluate insurance options for weight loss medication coverage during open enrollment - Consider genetic testing for personalized obesity treatment if interested - Engage in regular exercise, including swimming and low-impact activities - Monitor body fat percentage and aim for a goal of 36%

## 2023-10-17 NOTE — Assessment & Plan Note (Signed)
 Her HOMA-IR is 2.48 which is mildly elevated. Optimal level < 1.9.   This is complex condition associated with genetics, ectopic fat and lifestyle factors. Insulin  resistance may result in increased fat storage, inhibition of the breakdown of fat, cause fluctuations in blood sugar leading to energy crashes and increased cravings for sugary or high carb foods and cause metabolic slowdown making it difficult to lose weight.  This may result in additional weight gain and lead to pre-diabetes and diabetes if untreated. In addition, hyperinsulinemia increases cardiovascular risk, chronic inflammatory response and may increase the risk of obesity related malignancies.  Lab Results  Component Value Date   HGBA1C 5.6 08/04/2023   Lab Results  Component Value Date   INSULIN  11.7 08/04/2023   INSULIN  9.9 08/12/2020   Lab Results  Component Value Date   GLUCOSE 84 09/29/2023   GLUCOSE 100 (H) 07/20/2018     She may also be a candidate for pharmacoprophylaxis with metformin and / or GLP1 medication.

## 2023-10-17 NOTE — Assessment & Plan Note (Signed)
 Patient is on amitriptyline  and has been on medication for several years.  This medication may contribute to weight gain and may make weight loss difficult.  She will work with prescribing physician to find weight neutral alternative.

## 2023-10-17 NOTE — Assessment & Plan Note (Signed)
 Based on ultrasound from 2022.  Most recent liver enzymes and platelet count are within normal limits.   Fibrosis 4 Score = .92 (Low risk)        Interpretation for patients with NAFLD          <1.30       -  F0-F1 (Low risk)          1.30-2.67 -  Indeterminate           >2.67      -  F3-F4 (High risk)     Validated for ages 77-65       Losing 15% of body weight may improve condition.

## 2023-10-17 NOTE — Progress Notes (Signed)
 Office: [phone]  /  Fax: [phone]  Weight Summary and Body Composition Analysis (BIA)  Vitals Temp: 98.5 F (36.9 C) BP: 120/84 Pulse Rate: 77 SpO2: 97 %   Anthropometric Measurements Height: 5' 7 (1.702 m) Weight: 247 lb (112 kg) BMI (Calculated): 38.68 Weight at Last Visit: 252 lb Weight Lost Since Last Visit: 5 lb Weight Gained Since Last Visit: 0 lb Starting Weight: 264 lb Total Weight Loss (lbs): 17 lb (7.711 kg) Peak Weight: 277 lb   Body Composition  Body Fat %: 48.7 % Fat Mass (lbs): 120.6 lbs Muscle Mass (lbs): 120.6 lbs Total Body Water (lbs): 94.8 lbs Visceral Fat Rating : 15    RMR: 1944  Today's Visit #: 4  Starting Date: 08/04/23   Subjective   Chief Complaint: Obesity  Interval History Discussed the use of AI scribe software for clinical note transcription with the patient, who gave verbal consent to proceed.  History of Present Illness   Brittany Malone is a 58 year old female who presents for medical weight management.  She has lost five pounds since her last visit and is adhering to a 1500 calorie meal plan with good adherence. Her clothes are fitting looser, and she occasionally uses a protein shake as a meal replacement, especially before gym sessions, about two to three times a week. She is not using weight loss medications currently due to insurance coverage issues but is considering options during the upcoming open enrollment period.  Her body fat percentage has decreased from 51% to 48% since starting the program. She recalls a previous weight loss to her lowest of 217-218 pounds in February, attributing past weight regain to stress and reduced exercise due to back pain. Stress impacts her exercise routine and dietary planning, leading to more convenient food choices rather than stress eating.  She experiences back pain, which has recently worsened, causing her to take a week off from exercise. She plans to swim as a  low-impact activity. Exercise helps her manage stress and maintain dietary discipline. She can perform upper body exercises and use the elliptical glider without exacerbating her back pain, but walking or standing for long periods is challenging.  Regarding her rheumatoid arthritis, she is mostly asymptomatic with only occasional pain in her toes and achiness in her hands, without significant morning stiffness. No recent changes in her arthritis symptoms are reported.  She maintains a protein intake of about 90 grams per day, which she believes helps with appetite control.      Challenges affecting patient progress: orthopedic problems, medical conditions or chronic pain affecting mobility.    Pharmacotherapy for weight management: She is currently taking no anti-obesity medication.   Assessment and Plan   Treatment Plan For Obesity:  Recommended Dietary Goals  [name] is currently in the action stage of change. As such, her goal is to continue weight management plan. She has agreed to: continue current plan  Behavioral Health and Counseling  We discussed the following behavioral modification strategies today: continue to work on maintaining a reduced calorie state, getting the recommended amount of protein, incorporating whole foods, making healthy choices, staying well hydrated and practicing mindfulness when eating..  Additional education and resources provided today: None  Recommended Physical Activity Goals  [name] has been advised to work up to 150 minutes of moderate intensity aerobic activity a week and strengthening exercises 2-3 times per week for cardiovascular health, weight loss maintenance and preservation of muscle mass.   She has agreed to :  continue to gradually increase the amount and intensity of exercise routine  Medical Interventions and Pharmacotherapy  We discussed various medication options to help Priscillia with her weight loss efforts and we both agreed to :  Continue with current nutritional and behavioral strategies  Associated Conditions Impacted by Obesity Treatment  Assessment & Plan Insulin  resistance Her HOMA-IR is 2.48 which is mildly elevated. Optimal level < 1.9.   This is complex condition associated with genetics, ectopic fat and lifestyle factors. Insulin  resistance may result in increased fat storage, inhibition of the breakdown of fat, cause fluctuations in blood sugar leading to energy crashes and increased cravings for sugary or high carb foods and cause metabolic slowdown making it difficult to lose weight.  This may result in additional weight gain and lead to pre-diabetes and diabetes if untreated. In addition, hyperinsulinemia increases cardiovascular risk, chronic inflammatory response and may increase the risk of obesity related malignancies.  Lab Results  Component Value Date   HGBA1C 5.6 08/04/2023   Lab Results  Component Value Date   INSULIN  11.7 08/04/2023   INSULIN  9.9 08/12/2020   Lab Results  Component Value Date   GLUCOSE 84 09/29/2023   GLUCOSE 100 (H) 07/20/2018     She may also be a candidate for pharmacoprophylaxis with metformin and / or GLP1 medication.   Metabolic dysfunction-associated steatotic liver disease (MASLD) Based on ultrasound from 2022.  Most recent liver enzymes and platelet count are within normal limits.   Fibrosis 4 Score = .92 (Low risk)        Interpretation for patients with NAFLD          <1.30       -  F0-F1 (Low risk)          1.30-2.67 -  Indeterminate           >2.67      -  F3-F4 (High risk)     Validated for ages 55-65       Losing 15% of body weight may improve condition.   Weight gain due to medication Patient is on amitriptyline  and has been on medication for several years.  This medication may contribute to weight gain and may make weight loss difficult.  She will work with prescribing physician to find weight neutral alternative. Class 3 severe obesity with  serious comorbidity and body mass index (BMI) of 40.0 to 44.9 in adult, unspecified obesity type Weight: decrease of 28 lb (10.2%) over 6 months, 3 weeks  Start: 03/23/2023 275 lb (124.7 kg)  End: 10/17/2023 247 lb (112 kg)  Obesity management is ongoing with a recent weight loss of five pounds. She is following a 1500 calorie meal plan with good compliance and reports feeling satisfied with her meals. She is not currently on weight loss medications due to insurance coverage issues but is considering options during upcoming open enrollment. Genetic testing for personalized obesity treatment is available but not covered by insurance. She is engaging in regular exercise, including swimming and low-impact activities, to accommodate back pain. Her body fat percentage has decreased from 51% to 48%, with a goal to reach 36%. - Continue 1500 calorie meal plan - Evaluate insurance options for weight loss medication coverage during open enrollment - Consider genetic testing for personalized obesity treatment if interested - Engage in regular exercise, including swimming and low-impact activities - Monitor body fat percentage and aim for a goal of 36%          Objective  Physical Exam:  Blood pressure 120/84, pulse 77, temperature 98.5 F (36.9 C), height 5' 7 (1.702 m), weight 247 lb (112 kg), SpO2 97%. Body mass index is 38.69 kg/m.  General: She is overweight, cooperative, alert, well developed, and in no acute distress. PSYCH: Has normal mood, affect and thought process.   HEENT: EOMI, sclerae are anicteric. Lungs: Normal breathing effort, no conversational dyspnea. Extremities: No edema.  Neurologic: No gross sensory or motor deficits. No tremors or fasciculations noted.    Diagnostic Data Reviewed:  BMET    Component Value Date/Time   NA 141 09/29/2023 1351   K 4.3 09/29/2023 1351   CL 105 09/29/2023 1351   CO2 25 09/29/2023 1351   GLUCOSE 84 09/29/2023 1351   BUN 19 09/29/2023  1351   CREATININE 0.53 09/29/2023 1351   CALCIUM 9.7 09/29/2023 1351   GFRNONAA >60 12/15/2020 1901   GFRNONAA 98 08/26/2020 1607   GFRAA 114 08/26/2020 1607   Lab Results  Component Value Date   HGBA1C 5.6 08/04/2023   HGBA1C 5.4 06/16/2020   Lab Results  Component Value Date   INSULIN  11.7 08/04/2023   INSULIN  9.9 08/12/2020   Lab Results  Component Value Date   TSH 1.580 08/04/2023   CBC    Component Value Date/Time   WBC 5.5 09/29/2023 1351   RBC 4.68 09/29/2023 1351   HGB 13.9 09/29/2023 1351   HCT 42.3 09/29/2023 1351   PLT 205 09/29/2023 1351   MCV 90.4 09/29/2023 1351   MCH 29.7 09/29/2023 1351   MCHC 32.9 09/29/2023 1351   RDW 12.7 09/29/2023 1351   Iron Studies    Component Value Date/Time   IRON 116 04/16/2020 1227   FERRITIN 94.8 04/16/2020 1227   IRONPCTSAT 32.6 04/16/2020 1227   Lipid Panel     Component Value Date/Time   CHOL 177 08/04/2023 0853   TRIG 97 08/04/2023 0853   HDL 70 08/04/2023 0853   CHOLHDL 3 05/12/2022 0726   VLDL 18.6 05/12/2022 0726   LDLCALC 90 08/04/2023 0853   Hepatic Function Panel     Component Value Date/Time   PROT 6.8 09/29/2023 1351   ALBUMIN 3.9 05/12/2022 0726   AST 22 09/29/2023 1351   ALT 24 09/29/2023 1351   ALKPHOS 75 05/12/2022 0726   BILITOT 0.4 09/29/2023 1351   BILIDIR 0.1 12/22/2020 0753   IBILI 0.3 12/22/2020 0753      Component Value Date/Time   TSH 1.580 08/04/2023 0853   Nutritional Lab Results  Component Value Date   VD25OH 19.0 (L) 08/04/2023   VD25OH 19.4 (L) 08/12/2020    Medications: Outpatient Encounter Medications as of 10/17/2023  Medication Sig   acetaminophen  (TYLENOL ) 325 MG tablet Take 650 mg by mouth every 6 (six) hours as needed.   albuterol  (VENTOLIN  HFA) 108 (90 Base) MCG/ACT inhaler Inhale 2 puffs into the lungs every 6 (six) hours as needed for wheezing or shortness of breath.   amitriptyline  (ELAVIL ) 50 MG tablet TAKE 1 TABLET BY MOUTH AT BEDTIME AS NEEDED FOR  SLEEP   beclomethasone (QVAR  REDIHALER) 40 MCG/ACT inhaler Inhale 2 puffs into the lungs 2 (two) times daily. Rinse mouth out after use.   fluticasone (FLONASE) 50 MCG/ACT nasal spray Place into both nostrils as needed for allergies or rhinitis.   folic acid  (FOLVITE ) 1 MG tablet Take 2 tablets (2 mg total) by mouth daily.   hydroxychloroquine  (PLAQUENIL ) 200 MG tablet Take 1 tablet (200 mg total) by mouth daily.  MELATONIN PO Take 10 mg by mouth at bedtime.   Methotrexate , PF, (OTREXUP ) 25 MG/0.4ML SOAJ Inject 25 mg into the skin once a week.   polyethylene glycol (MIRALAX  / GLYCOLAX ) 17 g packet Take 17 g by mouth daily as needed.   Vitamin D , Ergocalciferol , (DRISDOL ) 1.25 MG (50000 UNIT) CAPS capsule Take 1 capsule (50,000 Units total) by mouth every 7 (seven) days.   No facility-administered encounter medications on file as of 10/17/2023.     Follow-Up   Return in about 4 weeks (around 11/14/2023) for For Weight Mangement with Dr. Francyne.SABRA She was informed of the importance of frequent follow up visits to maximize her success with intensive lifestyle modifications for her multiple health conditions.  Attestation Statement   Reviewed by clinician on day of visit: allergies, medications, problem list, medical history, surgical history, family history, social history, and previous encounter notes.     Lucas Francyne, MD

## 2023-10-19 ENCOUNTER — Other Ambulatory Visit (HOSPITAL_COMMUNITY): Payer: Self-pay

## 2023-10-20 DIAGNOSIS — B5801 Toxoplasma chorioretinitis: Secondary | ICD-10-CM | POA: Diagnosis not present

## 2023-10-20 DIAGNOSIS — M0560 Rheumatoid arthritis of unspecified site with involvement of other organs and systems: Secondary | ICD-10-CM | POA: Diagnosis not present

## 2023-10-20 DIAGNOSIS — Z79899 Other long term (current) drug therapy: Secondary | ICD-10-CM | POA: Diagnosis not present

## 2023-10-20 DIAGNOSIS — H31013 Macula scars of posterior pole (postinflammatory) (post-traumatic), bilateral: Secondary | ICD-10-CM | POA: Diagnosis not present

## 2023-10-20 DIAGNOSIS — H43822 Vitreomacular adhesion, left eye: Secondary | ICD-10-CM | POA: Diagnosis not present

## 2023-10-26 ENCOUNTER — Other Ambulatory Visit: Payer: Self-pay

## 2023-10-28 ENCOUNTER — Other Ambulatory Visit (HOSPITAL_COMMUNITY): Payer: Self-pay

## 2023-10-30 DIAGNOSIS — M25511 Pain in right shoulder: Secondary | ICD-10-CM | POA: Diagnosis not present

## 2023-10-30 DIAGNOSIS — W01198A Fall on same level from slipping, tripping and stumbling with subsequent striking against other object, initial encounter: Secondary | ICD-10-CM | POA: Diagnosis not present

## 2023-10-30 DIAGNOSIS — S6291XA Unspecified fracture of right wrist and hand, initial encounter for closed fracture: Secondary | ICD-10-CM | POA: Diagnosis not present

## 2023-10-30 DIAGNOSIS — Z0189 Encounter for other specified special examinations: Secondary | ICD-10-CM | POA: Diagnosis not present

## 2023-10-30 DIAGNOSIS — S62646A Nondisplaced fracture of proximal phalanx of right little finger, initial encounter for closed fracture: Secondary | ICD-10-CM | POA: Diagnosis not present

## 2023-10-30 DIAGNOSIS — W010XXA Fall on same level from slipping, tripping and stumbling without subsequent striking against object, initial encounter: Secondary | ICD-10-CM | POA: Diagnosis not present

## 2023-10-30 DIAGNOSIS — S59911A Unspecified injury of right forearm, initial encounter: Secondary | ICD-10-CM | POA: Diagnosis not present

## 2023-11-03 ENCOUNTER — Other Ambulatory Visit (HOSPITAL_COMMUNITY): Payer: Self-pay

## 2023-11-03 DIAGNOSIS — S6991XA Unspecified injury of right wrist, hand and finger(s), initial encounter: Secondary | ICD-10-CM | POA: Diagnosis not present

## 2023-11-04 ENCOUNTER — Other Ambulatory Visit (HOSPITAL_COMMUNITY): Payer: Self-pay

## 2023-11-11 ENCOUNTER — Other Ambulatory Visit (HOSPITAL_COMMUNITY): Payer: Self-pay

## 2023-11-15 ENCOUNTER — Telehealth: Payer: Self-pay

## 2023-11-15 ENCOUNTER — Other Ambulatory Visit (HOSPITAL_COMMUNITY): Payer: Self-pay

## 2023-11-15 ENCOUNTER — Ambulatory Visit (INDEPENDENT_AMBULATORY_CARE_PROVIDER_SITE_OTHER): Admitting: Internal Medicine

## 2023-11-15 ENCOUNTER — Other Ambulatory Visit: Payer: Self-pay

## 2023-11-15 DIAGNOSIS — M0579 Rheumatoid arthritis with rheumatoid factor of multiple sites without organ or systems involvement: Secondary | ICD-10-CM

## 2023-11-15 DIAGNOSIS — H2013 Chronic iridocyclitis, bilateral: Secondary | ICD-10-CM

## 2023-11-15 NOTE — Telephone Encounter (Signed)
 ATC patient regarding OTREXUP  being permanently discontinued by the manufacturer. Based on dispense history it doesn't look like the patient has filled OTREXUP  since mid-May 2025 indicating she would be off of treatment for several months now. LVM for patient to call back to see if she wants us  to try and get her approved for RASUVO , or if she was doing okay off the medication. Dispense history shows that patient is filling her folic acid  however, this would be unnecessary if she is off of the methotrexate .   Andriette KYM [facility] [facility] PharmD Candidate Class of 2026

## 2023-11-17 ENCOUNTER — Other Ambulatory Visit (HOSPITAL_COMMUNITY): Payer: Self-pay

## 2023-11-17 ENCOUNTER — Other Ambulatory Visit (HOSPITAL_BASED_OUTPATIENT_CLINIC_OR_DEPARTMENT_OTHER): Payer: Self-pay

## 2023-11-17 MED ORDER — METHOTREXATE SODIUM CHEMO INJECTION 50 MG/2ML
25.0000 mg | INTRAMUSCULAR | 0 refills | Status: DC
  Filled 2023-11-17: qty 4, 28d supply, fill #0

## 2023-11-17 MED ORDER — TUBERCULIN-ALLERGY SYRINGES 27G X 1/2" 1 ML MISC
1 refills | Status: AC
  Filled 2023-11-17: qty 12, 90d supply, fill #0

## 2023-11-17 NOTE — Telephone Encounter (Signed)
 Rx for MTX vial/syringe sent to [facility]  Dose: 25mg  (1mL) subcut once weekly with folic acid  2mg  daily.  Sherry Pennant, PharmD, MPH, BCPS, CPP Clinical Pharmacist [facility])

## 2023-11-17 NOTE — Telephone Encounter (Signed)
 Called patient in regard to OTREXUP  discontinuation. 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 Patient states she had a back log of the vials and she has been using those but will need a new prescription. Confirmed with patient that these vials were not expired. Patient confirmed she is still taking folic acid  as well. Advised patient she will need to come in for labs at the end of October. Prescription sent to [facility].

## 2023-11-24 DIAGNOSIS — S6991XD Unspecified injury of right wrist, hand and finger(s), subsequent encounter: Secondary | ICD-10-CM | POA: Diagnosis not present

## 2023-11-29 ENCOUNTER — Encounter (INDEPENDENT_AMBULATORY_CARE_PROVIDER_SITE_OTHER): Payer: Self-pay | Admitting: Internal Medicine

## 2023-11-29 ENCOUNTER — Ambulatory Visit (INDEPENDENT_AMBULATORY_CARE_PROVIDER_SITE_OTHER): Admitting: Internal Medicine

## 2023-11-29 VITALS — BP 128/80 | HR 79 | Temp 98.1°F | Ht 67.0 in | Wt 240.0 lb

## 2023-11-29 DIAGNOSIS — E66812 Obesity, class 2: Secondary | ICD-10-CM | POA: Diagnosis not present

## 2023-11-29 DIAGNOSIS — R635 Abnormal weight gain: Secondary | ICD-10-CM

## 2023-11-29 DIAGNOSIS — E88819 Insulin resistance, unspecified: Secondary | ICD-10-CM

## 2023-11-29 DIAGNOSIS — T43015D Adverse effect of tricyclic antidepressants, subsequent encounter: Secondary | ICD-10-CM

## 2023-11-29 DIAGNOSIS — K760 Fatty (change of) liver, not elsewhere classified: Secondary | ICD-10-CM | POA: Diagnosis not present

## 2023-11-29 DIAGNOSIS — Z6837 Body mass index (BMI) 37.0-37.9, adult: Secondary | ICD-10-CM

## 2023-11-29 NOTE — Assessment & Plan Note (Addendum)
 Her HOMA-IR is 2.48 which is mildly elevated. Optimal level < 1.9.   This is complex condition associated with genetics, ectopic fat and lifestyle factors. Insulin  resistance may result in increased fat storage, inhibition of the breakdown of fat, cause fluctuations in blood sugar leading to energy crashes and increased cravings for sugary or high carb foods and cause metabolic slowdown making it difficult to lose weight.  This may result in additional weight gain and lead to pre-diabetes and diabetes if untreated. In addition, hyperinsulinemia increases cardiovascular risk, chronic inflammatory response and may increase the risk of obesity related malignancies.  Lab Results  Component Value Date   HGBA1C 5.6 08/04/2023   Lab Results  Component Value Date   INSULIN  11.7 08/04/2023   INSULIN  9.9 08/12/2020   Lab Results  Component Value Date   GLUCOSE 84 09/29/2023   GLUCOSE 100 (H) 07/20/2018   She has declined pharmacoprophylaxis with metformin in the past.

## 2023-11-29 NOTE — Assessment & Plan Note (Addendum)
 Weight: decrease of 35 lb (12.7%) over 8 months, 1 week  Start: 03/23/2023 275 lb (124.7 kg)  End: 11/29/2023 240 lb (108.9 kg)  Obesity with insulin  resistance, MASLD, and medication-associated weight gain Obesity with associated insulin  resistance, MASLD, and weight gain due to medication. She has lost seven pounds since the last visit, following a 1500 calorie nutrition plan 65% of the time, and engaging in regular exercise. Body fat percentage decreased from 48% to 47%, and visceral fat reduced from 17 to 14. She is managing weight despite being on medications like methotrexate  and Elavil , which can contribute to weight gain. Exercise is a significant factor in her weight management. - Continue current nutrition and exercise regimen. - Evaluate insurance coverage for weight loss medications like Rukobia or Cephound. - Consider metformin for weight management and appetite suppression. - Discuss potential use of GLP-1 receptor agonists, pending insurance coverage. - Follow up in one month to discuss medication options and progress.

## 2023-11-29 NOTE — Assessment & Plan Note (Addendum)
 Based on ultrasound from 2022.  Most recent liver enzymes and platelet count are within normal limits.   Fibrosis 4 Score = .92 (Low risk)        Interpretation for patients with NAFLD          <1.30       -  F0-F1 (Low risk)          1.30-2.67 -  Indeterminate           >2.67      -  F3-F4 (High risk)     Validated for ages 23-65       She continues to work on reducing simple and added sugars in diet and saturated fats.  Losing 15% of body weight may improve condition.   Chronic use of methotrexate  may cause hepatic steatosis.  Continue to monitor liver function.

## 2023-11-29 NOTE — Assessment & Plan Note (Signed)
 Patient is on amitriptyline  and has been on medication for several years.  This medication may contribute to weight gain and may make weight loss difficult.  She will work with prescribing physician to find weight neutral alternative.

## 2023-11-29 NOTE — Progress Notes (Signed)
 Office: [phone]  /  Fax: [phone]  Weight Summary and Body Composition Analysis (BIA)  Vitals Temp: 98.1 F (36.7 C) BP: 128/80 Pulse Rate: 79 SpO2: 98 %   Anthropometric Measurements Height: 5' 7 (1.702 m) Weight: 240 lb (108.9 kg) BMI (Calculated): 37.58 Weight at Last Visit: 247 lb Weight Lost Since Last Visit: 7 lb Weight Gained Since Last Visit: 0 lb Starting Weight: 264 lb Total Weight Loss (lbs): 24 lb (10.9 kg) Peak Weight: 277 lb   Body Composition  Body Fat %: 47.2 % Fat Mass (lbs): 113.6 lbs Muscle Mass (lbs): 120.6 lbs Total Body Water (lbs): 89.2 lbs Visceral Fat Rating : 14    RMR: 1944  Today's Visit #: 5  Starting Date: 08/04/23   Subjective   Chief Complaint: Obesity  Interval History Discussed the use of AI scribe software for clinical note transcription with the patient, who gave verbal consent to proceed.  History of Present Illness Brittany Malone is a 58 year old female with insulin  resistance and MASLD who presents for medical weight management.  She has lost seven pounds since her last visit and adheres to a 1500 calorie nutrition plan approximately 65% of the time, focusing on whole foods, adequate protein intake, and hydration. She exercises three days a week, engaging in 30 minutes of cardio and strengthening exercises. A recent chest cold temporarily affected her ability to exercise, but she has since resumed her routine.  She has experienced weight fluctuations, with significant weight loss in 2022 motivated by the birth of her grandchildren. However, stress related to her husband's job and a move, along with back problems, led to weight gain in early 2023. Regular exercise helps her maintain better nutrition and weight control.  Her current medications include methotrexate , which was switched from a brand name pen to a generic form, Plaquenil , and Elavil , which she takes nightly. She has lost weight and her body  composition has changed, with reductions in body fat percentage and visceral fat while on these medications.  She mentions work-related stress due to being short-staffed since February, impacting her ability to maintain a consistent exercise routine. She recently hired a Secondary school teacher, which she hopes will alleviate some of this stress.     Challenges affecting patient progress: moderate to high levels of stress.    Pharmacotherapy for weight management: She is currently taking no anti-obesity medication and will be looking into coverage now that her insurance is changing..   Assessment and Plan   Treatment Plan For Obesity:  Recommended Dietary Goals  Brittany Malone is currently in the action stage of change. As such, her goal is to continue weight management plan. She has agreed to: continue current plan  Behavioral Health and Counseling  We discussed the following behavioral modification strategies today: continue to work on maintaining a reduced calorie state, getting the recommended amount of protein, incorporating whole foods, making healthy choices, staying well hydrated and practicing mindfulness when eating., increase protein intake, fibrous foods (25 grams per day for women, 30 grams for men) and water to improve satiety and decrease hunger signals. , avoid all or none thinking, and display self-compassion.  Additional education and resources provided today: None  Recommended Physical Activity Goals  Brittany Malone has been advised to work up to 150 minutes of moderate intensity aerobic activity a week and strengthening exercises 2-3 times per week for cardiovascular health, weight loss maintenance and preservation of muscle mass.  She has agreed to :  Increase volume of physical  activity to a goal of 240 minutes a week and Combine aerobic and strengthening exercises for efficiency and improved cardiometabolic health.  Medical Interventions and Pharmacotherapy  We discussed various  medication options to help Brittany Malone with her weight loss efforts and we both agreed to : Continue with current nutritional and behavioral strategies and we discussed the benefits and role of metformin and weight management.  She will have new insurance in about a month and will look into coverage for GLP-1  Associated Conditions Impacted by Obesity Treatment  Assessment & Plan Metabolic dysfunction-associated steatotic liver disease (MASLD) Based on ultrasound from 2022.  Most recent liver enzymes and platelet count are within normal limits.   Fibrosis 4 Score = .92 (Low risk)        Interpretation for patients with NAFLD          <1.30       -  F0-F1 (Low risk)          1.30-2.67 -  Indeterminate           >2.67      -  F3-F4 (High risk)     Validated for ages 7-65       She continues to work on reducing simple and added sugars in diet and saturated fats.  Losing 15% of body weight may improve condition.   Chronic use of methotrexate  may cause hepatic steatosis.  Continue to monitor liver function.  Insulin  resistance Her HOMA-IR is 2.48 which is mildly elevated. Optimal level < 1.9.   This is complex condition associated with genetics, ectopic fat and lifestyle factors. Insulin  resistance may result in increased fat storage, inhibition of the breakdown of fat, cause fluctuations in blood sugar leading to energy crashes and increased cravings for sugary or high carb foods and cause metabolic slowdown making it difficult to lose weight.  This may result in additional weight gain and lead to pre-diabetes and diabetes if untreated. In addition, hyperinsulinemia increases cardiovascular risk, chronic inflammatory response and may increase the risk of obesity related malignancies.  Lab Results  Component Value Date   HGBA1C 5.6 08/04/2023   Lab Results  Component Value Date   INSULIN  11.7 08/04/2023   INSULIN  9.9 08/12/2020   Lab Results  Component Value Date   GLUCOSE 84 09/29/2023    GLUCOSE 100 (H) 07/20/2018   She has declined pharmacoprophylaxis with metformin in the past.  Weight gain due to medication Patient is on amitriptyline  and has been on medication for several years.  This medication may contribute to weight gain and may make weight loss difficult.  She will work with prescribing physician to find weight neutral alternative. Class 2 severe obesity with serious comorbidity and body mass index (BMI) of 37.0 to 37.9 in adult, unspecified obesity type Weight: decrease of 35 lb (12.7%) over 8 months, 1 week  Start: 03/23/2023 275 lb (124.7 kg)  End: 11/29/2023 240 lb (108.9 kg)  Obesity with insulin  resistance, MASLD, and medication-associated weight gain Obesity with associated insulin  resistance, MASLD, and weight gain due to medication. She has lost seven pounds since the last visit, following a 1500 calorie nutrition plan 65% of the time, and engaging in regular exercise. Body fat percentage decreased from 48% to 47%, and visceral fat reduced from 17 to 14. She is managing weight despite being on medications like methotrexate  and Elavil , which can contribute to weight gain. Exercise is a significant factor in her weight management. - Continue current nutrition and exercise regimen. -  Evaluate insurance coverage for weight loss medications like Rukobia or Cephound. - Consider metformin for weight management and appetite suppression. - Discuss potential use of GLP-1 receptor agonists, pending insurance coverage. - Follow up in one month to discuss medication options and progress.          Objective   Physical Exam:  Blood pressure 128/80, pulse 79, temperature 98.1 F (36.7 C), height 5' 7 (1.702 m), weight 240 lb (108.9 kg), SpO2 98%. Body mass index is 37.59 kg/m.  General: She is overweight, cooperative, alert, well developed, and in no acute distress. PSYCH: Has normal mood, affect and thought process.   HEENT: EOMI, sclerae are  anicteric. Lungs: Normal breathing effort, no conversational dyspnea. Extremities: No edema.  Neurologic: No gross sensory or motor deficits. No tremors or fasciculations noted.    Diagnostic Data Reviewed:  BMET    Component Value Date/Time   NA 141 09/29/2023 1351   K 4.3 09/29/2023 1351   CL 105 09/29/2023 1351   CO2 25 09/29/2023 1351   GLUCOSE 84 09/29/2023 1351   BUN 19 09/29/2023 1351   CREATININE 0.53 09/29/2023 1351   CALCIUM 9.7 09/29/2023 1351   GFRNONAA >60 12/15/2020 1901   GFRNONAA 98 08/26/2020 1607   GFRAA 114 08/26/2020 1607   Lab Results  Component Value Date   HGBA1C 5.6 08/04/2023   HGBA1C 5.4 06/16/2020   Lab Results  Component Value Date   INSULIN  11.7 08/04/2023   INSULIN  9.9 08/12/2020   Lab Results  Component Value Date   TSH 1.580 08/04/2023   CBC    Component Value Date/Time   WBC 5.5 09/29/2023 1351   RBC 4.68 09/29/2023 1351   HGB 13.9 09/29/2023 1351   HCT 42.3 09/29/2023 1351   PLT 205 09/29/2023 1351   MCV 90.4 09/29/2023 1351   MCH 29.7 09/29/2023 1351   MCHC 32.9 09/29/2023 1351   RDW 12.7 09/29/2023 1351   Iron Studies    Component Value Date/Time   IRON 116 04/16/2020 1227   FERRITIN 94.8 04/16/2020 1227   IRONPCTSAT 32.6 04/16/2020 1227   Lipid Panel     Component Value Date/Time   CHOL 177 08/04/2023 0853   TRIG 97 08/04/2023 0853   HDL 70 08/04/2023 0853   CHOLHDL 3 05/12/2022 0726   VLDL 18.6 05/12/2022 0726   LDLCALC 90 08/04/2023 0853   Hepatic Function Panel     Component Value Date/Time   PROT 6.8 09/29/2023 1351   ALBUMIN 3.9 05/12/2022 0726   AST 22 09/29/2023 1351   ALT 24 09/29/2023 1351   ALKPHOS 75 05/12/2022 0726   BILITOT 0.4 09/29/2023 1351   BILIDIR 0.1 12/22/2020 0753   IBILI 0.3 12/22/2020 0753      Component Value Date/Time   TSH 1.580 08/04/2023 0853   Nutritional Lab Results  Component Value Date   VD25OH 19.0 (L) 08/04/2023   VD25OH 19.4 (L) 08/12/2020     Medications: Outpatient Encounter Medications as of 11/29/2023  Medication Sig   acetaminophen  (TYLENOL ) 325 MG tablet Take 650 mg by mouth every 6 (six) hours as needed.   albuterol  (VENTOLIN  HFA) 108 (90 Base) MCG/ACT inhaler Inhale 2 puffs into the lungs every 6 (six) hours as needed for wheezing or shortness of breath.   amitriptyline  (ELAVIL ) 50 MG tablet TAKE 1 TABLET BY MOUTH AT BEDTIME AS NEEDED FOR SLEEP   beclomethasone (QVAR  REDIHALER) 40 MCG/ACT inhaler Inhale 2 puffs into the lungs 2 (two) times daily. Rinse mouth out  after use.   fluticasone (FLONASE) 50 MCG/ACT nasal spray Place into both nostrils as needed for allergies or rhinitis.   folic acid  (FOLVITE ) 1 MG tablet Take 2 tablets (2 mg total) by mouth daily.   hydroxychloroquine  (PLAQUENIL ) 200 MG tablet Take 1 tablet (200 mg total) by mouth daily.   MELATONIN PO Take 10 mg by mouth at bedtime.   methotrexate  50 MG/2ML injection Inject 1 mL (25 mg total) into the skin once a week. ROTATE INJECTION SITES. KEEP AT ROOM TEMPERATURE. PROTECT FROM LIGHT.   polyethylene glycol (MIRALAX  / GLYCOLAX ) 17 g packet Take 17 g by mouth daily as needed.   Tuberculin-Allergy  Syringes 27G X 1/2 1 ML MISC Use to inject methotrexate  once weekly. DO NOT REUSE.   Vitamin D , Ergocalciferol , (DRISDOL ) 1.25 MG (50000 UNIT) CAPS capsule Take 1 capsule (50,000 Units total) by mouth every 7 (seven) days.   No facility-administered encounter medications on file as of 11/29/2023.     Follow-Up   Return in about 4 weeks (around 12/27/2023) for For Weight Mangement with Dr. Francyne.SABRA She was informed of the importance of frequent follow up visits to maximize her success with intensive lifestyle modifications for her multiple health conditions.  Attestation Statement   Reviewed by clinician on day of visit: allergies, medications, problem list, medical history, surgical history, family history, social history, and previous encounter notes.      Brittany Francyne, MD

## 2023-12-08 ENCOUNTER — Other Ambulatory Visit (HOSPITAL_BASED_OUTPATIENT_CLINIC_OR_DEPARTMENT_OTHER): Payer: Self-pay

## 2023-12-22 ENCOUNTER — Other Ambulatory Visit (HOSPITAL_COMMUNITY): Payer: Self-pay

## 2023-12-27 ENCOUNTER — Other Ambulatory Visit (HOSPITAL_BASED_OUTPATIENT_CLINIC_OR_DEPARTMENT_OTHER): Payer: Self-pay

## 2023-12-27 ENCOUNTER — Ambulatory Visit (INDEPENDENT_AMBULATORY_CARE_PROVIDER_SITE_OTHER): Admitting: Internal Medicine

## 2023-12-27 ENCOUNTER — Encounter (INDEPENDENT_AMBULATORY_CARE_PROVIDER_SITE_OTHER): Payer: Self-pay | Admitting: Internal Medicine

## 2023-12-27 VITALS — BP 130/84 | HR 79 | Temp 98.5°F | Ht 67.0 in | Wt 236.0 lb

## 2023-12-27 DIAGNOSIS — E66812 Obesity, class 2: Secondary | ICD-10-CM

## 2023-12-27 DIAGNOSIS — Z6837 Body mass index (BMI) 37.0-37.9, adult: Secondary | ICD-10-CM

## 2023-12-27 DIAGNOSIS — E88819 Insulin resistance, unspecified: Secondary | ICD-10-CM

## 2023-12-27 DIAGNOSIS — R635 Abnormal weight gain: Secondary | ICD-10-CM

## 2023-12-27 DIAGNOSIS — K760 Fatty (change of) liver, not elsewhere classified: Secondary | ICD-10-CM

## 2023-12-27 MED ORDER — METFORMIN HCL ER 500 MG PO TB24
500.0000 mg | ORAL_TABLET | Freq: Two times a day (BID) | ORAL | 0 refills | Status: DC
  Filled 2023-12-27: qty 60, 30d supply, fill #0

## 2023-12-27 NOTE — Assessment & Plan Note (Signed)
 Obesity with insulin  resistance and fatty liver disease She has achieved a 14% weight loss, reducing body fat from 51% to 46% and visceral fat from 17 to 14. Muscle mass increased by nearly 2 pounds. She adheres to a 1500 calorie diet and exercises four days a week. Challenges include fatigue and meal preparation difficulties, especially at dinner. - Discussed prepackaged meals and calorie counting for convenience. - Explored medication options: GLP-1s, Qsymia, and metformin. Chose metformin for its benefits on insulin  resistance, fatty liver, and appetite suppression, with fewer side effects than Qsymia. - Discussed potential GI side effects of metformin and strategies to minimize them. - Emphasized a balanced diet and potential for metformin to aid weight management without significant weight regain post-discontinuation. - Prescribe metformin extended release, starting once daily for 7 days, then increase to twice daily if tolerated. - Take metformin with food in the morning. - Schedule follow-up in 4 weeks to assess response to metformin.

## 2023-12-27 NOTE — Progress Notes (Signed)
 Office: [phone]  /  Fax: [phone]  Weight Summary and Body Composition Analysis (BIA)  Vitals Temp: 98.5 F (36.9 C) BP: 130/84 Pulse Rate: 79   Anthropometric Measurements Height: 5' 7 (1.702 m) Weight: 236 lb (107 kg) BMI (Calculated): 36.95 Weight at Last Visit: 240 lb Weight Lost Since Last Visit: 4 lb Weight Gained Since Last Visit: 0 lb Starting Weight: 264 lb Total Weight Loss (lbs): 28 lb (12.7 kg) Peak Weight: 277 lb   Body Composition  Body Fat %: 46.1 % Fat Mass (lbs): 109 lbs Muscle Mass (lbs): 121.2 lbs Total Body Water (lbs): 87.6 lbs Visceral Fat Rating : 14    RMR: 1944  Today's Visit #: 6  Starting Date: 08/04/23   Subjective   Chief Complaint: Obesity  Interval History Discussed the use of AI scribe software for clinical note transcription with the patient, who gave verbal consent to proceed.  History of Present Illness Brittany Malone is a 58 year old female with a history of fatty liver and insulin  resistance who presents for medical weight management.  She has been following a 1500 calorie nutrition plan with fair adherence, consuming the recommended amount of protein, eating more whole foods, maintaining adequate hydration, and exercising four days a week for 45 minutes, focusing on cardio strengthening. Since the beginning of the year, she has lost 39 pounds, approximately 14% of her body weight. She started her weight management journey in June, and this is her sixth or seventh visit. She has not had any visits where she maintained her weight.  She experiences some fatigue related to dieting and finds it challenging to maintain healthy eating habits at dinner time, especially after work. She does well with breakfast and lunch but sometimes struggles with dinner, finding it easier to grab less healthy options. She tries to cook twice a week, which provides four meals, but finds it difficult on other days.  Her husband, who is  diabetic, has lost weight without trying due to changes in his medication. She notes that he does not go to the gym and has lost weight in a way she finds 'disgusting'.  She has been monitoring her weight daily since 2018, noting fluctuations within an eight-pound range recently. Her weight can fluctuate by one to three pounds daily due to fluid shifts and other factors. She feels more energetic, particularly on days she goes to the gym. Her body composition has improved, with a decrease in body fat percentage from 51% to 46% and an increase in muscle mass by almost two pounds.     Challenges affecting patient progress: none.    Pharmacotherapy for weight management: She is currently taking no anti-obesity medication.   Assessment and Plan   Treatment Plan For Obesity:  Recommended Dietary Goals  Brittany Malone is currently in the action stage of change. As such, her goal is to continue weight management plan. She has agreed to: continue current plan  Behavioral Health and Counseling  We discussed the following behavioral modification strategies today: continue to work on maintaining a reduced calorie state, getting the recommended amount of protein, incorporating whole foods, making healthy choices, staying well hydrated and practicing mindfulness when eating. and increase protein intake, fibrous foods (25 grams per day for women, 30 grams for men) and water to improve satiety and decrease hunger signals. .  Additional education and resources provided today: None  Recommended Physical Activity Goals  Brittany Malone has been advised to work up to 150 minutes of moderate intensity  aerobic activity a week and strengthening exercises 2-3 times per week for cardiovascular health, weight loss maintenance and preservation of muscle mass.  She has agreed to :  Think about enjoyable ways to increase daily physical activity and overcoming barriers to exercise, Increase physical activity in their day and reduce  sedentary time (increase NEAT)., Increase volume of physical activity to a goal of 240 minutes a week, and Combine aerobic and strengthening exercises for efficiency and improved cardiometabolic health.  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 Muscle mass increased by nearly 2 pounds. She adheres to a 1500 calorie diet and exercises four days a week. Challenges include fatigue and meal preparation difficulties, especially at dinner. - Discussed prepackaged meals and calorie counting for convenience. - Explored medication options: GLP-1s, Qsymia, and metformin. Chose metformin for its benefits on insulin  resistance, fatty liver, and appetite suppression, with fewer side effects than Qsymia. - Discussed potential GI side effects of metformin and strategies to minimize them. - Emphasized a balanced diet and potential for metformin to aid weight management without significant weight regain post-discontinuation. - Prescribe metformin extended release, starting once daily for 7 days, then increase to twice daily if tolerated. - Take metformin with food in the morning. - Schedule follow-up in 4 weeks to assess response to metformin. Metabolic dysfunction-associated steatotic liver disease (MASLD) Based on ultrasound from 2022.  Most recent liver enzymes and platelet count are within normal limits.   Fibrosis 4 Score = .92 (Low risk)        Interpretation for patients with NAFLD           <1.30       -  F0-F1 (Low risk)          1.30-2.67 -  Indeterminate           >2.67      -  F3-F4 (High risk)     Validated for ages 20-65       She continues to work on reducing simple and added sugars in diet and saturated fats.  Losing 15% of body weight may improve condition.   Chronic use of methotrexate  may cause hepatic steatosis.  Continue to monitor liver function.  Insulin  resistance Her HOMA-IR is 2.48 which is mildly elevated. Optimal level < 1.9.   This is complex condition associated with genetics, ectopic fat and lifestyle factors. Insulin  resistance may result in increased fat storage, inhibition of the breakdown of fat, cause fluctuations in blood sugar leading to energy crashes and increased cravings for sugary or high carb foods and cause metabolic slowdown making it difficult to lose weight.  This may result in additional weight gain and lead to pre-diabetes and diabetes if untreated. In addition, hyperinsulinemia increases cardiovascular risk, chronic inflammatory response and may increase the risk of obesity related malignancies.  Lab Results  Component Value Date   HGBA1C 5.6 08/04/2023   Lab Results  Component Value Date   INSULIN  11.7 08/04/2023   INSULIN  9.9 08/12/2020   Lab  Results  Component Value Date   GLUCOSE 84 09/29/2023   GLUCOSE 100 (H) 07/20/2018   At the discussion of benefits and side effects she is agreeable to trying metformin XR 500 mg twice daily       Objective   Physical Exam:  Blood pressure 130/84, pulse 79, temperature 98.5 F (36.9 C), height 5' 7 (1.702 m), weight 236 lb (107 kg). Body mass index is 36.96 kg/m.  General: She is overweight, cooperative, alert, well developed, and in no acute distress. PSYCH: Has normal mood, affect and thought process.   HEENT: EOMI, sclerae are anicteric. Lungs: Normal breathing effort, no conversational dyspnea. Extremities: No edema.  Neurologic: No gross sensory or motor  deficits. No tremors or fasciculations noted.    Diagnostic Data Reviewed:  BMET    Component Value Date/Time   NA 141 09/29/2023 1351   K 4.3 09/29/2023 1351   CL 105 09/29/2023 1351   CO2 25 09/29/2023 1351   GLUCOSE 84 09/29/2023 1351   BUN 19 09/29/2023 1351   CREATININE 0.53 09/29/2023 1351   CALCIUM 9.7 09/29/2023 1351   GFRNONAA >60 12/15/2020 1901   GFRNONAA 98 08/26/2020 1607   GFRAA 114 08/26/2020 1607   Lab Results  Component Value Date   HGBA1C 5.6 08/04/2023   HGBA1C 5.4 06/16/2020   Lab Results  Component Value Date   INSULIN  11.7 08/04/2023   INSULIN  9.9 08/12/2020   Lab Results  Component Value Date   TSH 1.580 08/04/2023   CBC    Component Value Date/Time   WBC 5.5 09/29/2023 1351   RBC 4.68 09/29/2023 1351   HGB 13.9 09/29/2023 1351   HCT 42.3 09/29/2023 1351   PLT 205 09/29/2023 1351   MCV 90.4 09/29/2023 1351   MCH 29.7 09/29/2023 1351   MCHC 32.9 09/29/2023 1351   RDW 12.7 09/29/2023 1351   Iron Studies    Component Value Date/Time   IRON 116 04/16/2020 1227   FERRITIN 94.8 04/16/2020 1227   IRONPCTSAT 32.6 04/16/2020 1227   Lipid Panel     Component Value Date/Time   CHOL 177 08/04/2023 0853   TRIG 97 08/04/2023 0853   HDL 70 08/04/2023 0853   CHOLHDL 3 05/12/2022 0726   VLDL 18.6 05/12/2022 0726   LDLCALC 90 08/04/2023 0853   Hepatic Function Panel     Component Value Date/Time   PROT 6.8 09/29/2023 1351   ALBUMIN 3.9 05/12/2022 0726   AST 22 09/29/2023 1351   ALT 24 09/29/2023 1351   ALKPHOS 75 05/12/2022 0726   BILITOT 0.4 09/29/2023 1351   BILIDIR 0.1 12/22/2020 0753   IBILI 0.3 12/22/2020 0753      Component Value Date/Time   TSH 1.580 08/04/2023 0853   Nutritional Lab Results  Component Value Date   VD25OH 19.0 (L) 08/04/2023   VD25OH 19.4 (L) 08/12/2020    Medications: Outpatient Encounter Medications as of 12/27/2023  Medication Sig   acetaminophen  (TYLENOL ) 325 MG tablet Take 650 mg by mouth every  6 (six) hours as needed.   albuterol  (VENTOLIN  HFA) 108 (90 Base) MCG/ACT inhaler Inhale 2 puffs into the lungs every 6 (six) hours as needed for wheezing or shortness of breath.   amitriptyline  (ELAVIL ) 50 MG tablet TAKE 1 TABLET BY MOUTH AT BEDTIME AS NEEDED FOR SLEEP   beclomethasone (QVAR  REDIHALER) 40 MCG/ACT inhaler Inhale 2 puffs into the lungs 2 (two) times daily. Rinse mouth out after use.   fluticasone (FLONASE) 50 MCG/ACT nasal spray Place  into both nostrils as needed for allergies or rhinitis.   folic acid  (FOLVITE ) 1 MG tablet Take 2 tablets (2 mg total) by mouth daily.   hydroxychloroquine  (PLAQUENIL ) 200 MG tablet Take 1 tablet (200 mg total) by mouth daily.   MELATONIN PO Take 10 mg by mouth at bedtime.   metFORMIN (GLUCOPHAGE-XR) 500 MG 24 hr tablet Take 1 tablet (500 mg total) by mouth 2 (two) times daily with a meal.   methotrexate  50 MG/2ML injection Inject 1 mL (25 mg total) into the skin once a week. ROTATE INJECTION SITES. KEEP AT ROOM TEMPERATURE. PROTECT FROM LIGHT.   polyethylene glycol (MIRALAX  / GLYCOLAX ) 17 g packet Take 17 g by mouth daily as needed.   Tuberculin-Allergy  Syringes 27G X 1/2 1 ML MISC Use to inject methotrexate  once weekly. DO NOT REUSE.   Vitamin D , Ergocalciferol , (DRISDOL ) 1.25 MG (50000 UNIT) CAPS capsule Take 1 capsule (50,000 Units total) by mouth every 7 (seven) days.   No facility-administered encounter medications on file as of 12/27/2023.     Follow-Up   Return in about 4 weeks (around 01/24/2024) for For Weight Mangement with Dr. Francyne.Brittany Malone She was informed of the importance of frequent follow up visits to maximize her success with intensive lifestyle modifications for her multiple health conditions.  Attestation Statement   Reviewed by clinician on day of visit: allergies, medications, problem list, medical history, surgical history, family history, social history, and previous encounter notes.     Brittany Francyne, MD

## 2023-12-27 NOTE — Assessment & Plan Note (Signed)
 Her HOMA-IR is 2.48 which is mildly elevated. Optimal level < 1.9.   This is complex condition associated with genetics, ectopic fat and lifestyle factors. Insulin  resistance may result in increased fat storage, inhibition of the breakdown of fat, cause fluctuations in blood sugar leading to energy crashes and increased cravings for sugary or high carb foods and cause metabolic slowdown making it difficult to lose weight.  This may result in additional weight gain and lead to pre-diabetes and diabetes if untreated. In addition, hyperinsulinemia increases cardiovascular risk, chronic inflammatory response and may increase the risk of obesity related malignancies.  Lab Results  Component Value Date   HGBA1C 5.6 08/04/2023   Lab Results  Component Value Date   INSULIN  11.7 08/04/2023   INSULIN  9.9 08/12/2020   Lab Results  Component Value Date   GLUCOSE 84 09/29/2023   GLUCOSE 100 (H) 07/20/2018   At the discussion of benefits and side effects she is agreeable to trying metformin XR 500 mg twice daily

## 2023-12-27 NOTE — Assessment & Plan Note (Signed)
 Based on ultrasound from 2022.  Most recent liver enzymes and platelet count are within normal limits.   Fibrosis 4 Score = .92 (Low risk)        Interpretation for patients with NAFLD          <1.30       -  F0-F1 (Low risk)          1.30-2.67 -  Indeterminate           >2.67      -  F3-F4 (High risk)     Validated for ages 23-65       She continues to work on reducing simple and added sugars in diet and saturated fats.  Losing 15% of body weight may improve condition.   Chronic use of methotrexate  may cause hepatic steatosis.  Continue to monitor liver function.

## 2024-01-24 ENCOUNTER — Other Ambulatory Visit: Payer: Self-pay | Admitting: *Deleted

## 2024-01-24 ENCOUNTER — Encounter: Payer: Self-pay | Admitting: Rheumatology

## 2024-01-24 DIAGNOSIS — Z79899 Other long term (current) drug therapy: Secondary | ICD-10-CM

## 2024-01-25 LAB — CBC WITH DIFFERENTIAL/PLATELET
Absolute Lymphocytes: 1826 {cells}/uL (ref 850–3900)
Absolute Monocytes: 457 {cells}/uL (ref 200–950)
Basophils Absolute: 39 {cells}/uL (ref 0–200)
Basophils Relative: 0.7 %
Eosinophils Absolute: 99 {cells}/uL (ref 15–500)
Eosinophils Relative: 1.8 %
HCT: 42.6 % (ref 35.9–46.0)
Hemoglobin: 14 g/dL (ref 11.7–15.5)
MCH: 29.2 pg (ref 27.0–33.0)
MCHC: 32.9 g/dL (ref 31.6–35.4)
MCV: 88.9 fL (ref 81.4–101.7)
MPV: 10.1 fL (ref 7.5–12.5)
Monocytes Relative: 8.3 %
Neutro Abs: 3080 {cells}/uL (ref 1500–7800)
Neutrophils Relative %: 56 %
Platelets: 233 Thousand/uL (ref 140–400)
RBC: 4.79 Million/uL (ref 3.80–5.10)
RDW: 12.9 % (ref 11.0–15.0)
Total Lymphocyte: 33.2 %
WBC: 5.5 Thousand/uL (ref 3.8–10.8)

## 2024-01-25 LAB — COMPREHENSIVE METABOLIC PANEL WITH GFR
AG Ratio: 1.7 (calc) (ref 1.0–2.5)
ALT: 41 U/L — ABNORMAL HIGH (ref 6–29)
AST: 28 U/L (ref 10–35)
Albumin: 4.3 g/dL (ref 3.6–5.1)
Alkaline phosphatase (APISO): 70 U/L (ref 37–153)
BUN: 18 mg/dL (ref 7–25)
CO2: 27 mmol/L (ref 20–32)
Calcium: 9.6 mg/dL (ref 8.6–10.4)
Chloride: 104 mmol/L (ref 98–110)
Creat: 0.57 mg/dL (ref 0.50–1.05)
Globulin: 2.5 g/dL (ref 1.9–3.7)
Glucose, Bld: 82 mg/dL (ref 65–139)
Potassium: 4.3 mmol/L (ref 3.5–5.3)
Sodium: 140 mmol/L (ref 135–146)
Total Bilirubin: 0.5 mg/dL (ref 0.2–1.2)
Total Protein: 6.8 g/dL (ref 6.1–8.1)
eGFR: 104 mL/min/1.73m2 (ref >=60)

## 2024-01-29 ENCOUNTER — Ambulatory Visit: Payer: Self-pay | Admitting: Physician Assistant

## 2024-01-29 NOTE — Progress Notes (Signed)
 CBC WNL ALT is borderline elevated-please clarify if she has been taking any tylenol ? Alcohol use? Rest of CMP WNL

## 2024-01-30 NOTE — Progress Notes (Signed)
 2-3 weeks.  Please encourage the patient to try to limit methotrexate  use.  If ALT remains elevated we may need to consider reducing the dose of methotrexate 

## 2024-01-31 ENCOUNTER — Encounter (INDEPENDENT_AMBULATORY_CARE_PROVIDER_SITE_OTHER): Payer: Self-pay | Admitting: Internal Medicine

## 2024-01-31 ENCOUNTER — Ambulatory Visit (INDEPENDENT_AMBULATORY_CARE_PROVIDER_SITE_OTHER): Payer: Self-pay | Admitting: Internal Medicine

## 2024-01-31 VITALS — BP 126/86 | HR 82 | Temp 97.7°F | Ht 67.0 in | Wt 233.0 lb

## 2024-01-31 DIAGNOSIS — R635 Abnormal weight gain: Secondary | ICD-10-CM

## 2024-01-31 DIAGNOSIS — E559 Vitamin D deficiency, unspecified: Secondary | ICD-10-CM

## 2024-01-31 DIAGNOSIS — Z6837 Body mass index (BMI) 37.0-37.9, adult: Secondary | ICD-10-CM

## 2024-01-31 DIAGNOSIS — T43015D Adverse effect of tricyclic antidepressants, subsequent encounter: Secondary | ICD-10-CM | POA: Diagnosis not present

## 2024-01-31 DIAGNOSIS — E88819 Insulin resistance, unspecified: Secondary | ICD-10-CM

## 2024-01-31 DIAGNOSIS — K760 Fatty (change of) liver, not elsewhere classified: Secondary | ICD-10-CM

## 2024-01-31 DIAGNOSIS — E66812 Obesity, class 2: Secondary | ICD-10-CM

## 2024-01-31 NOTE — Assessment & Plan Note (Signed)
 Patient is on amitriptyline  and has been on medication for several years.  This medication may contribute to weight gain and may make weight loss difficult.  She will work with prescribing physician to find weight neutral alternative.

## 2024-01-31 NOTE — Assessment & Plan Note (Signed)
 Based on ultrasound from 2022.  Most recent liver enzymes were mildly elevated.   Fibrosis 4 Score = .92 (Low risk)        Interpretation for patients with NAFLD          <1.30       -  F0-F1 (Low risk)          1.30-2.67 -  Indeterminate           >2.67      -  F3-F4 (High risk)     Validated for ages 87-65       She continues to work on reducing simple and added sugars in diet and saturated fats.  Losing 15% of body weight may improve condition.   Chronic use of methotrexate  may cause hepatic steatosis.  Continue to monitor liver function.  Patient has decided to hold metformin  for now until liver enzymes are repeated.  We discussed medication safety and addressed concerns during this visit.

## 2024-01-31 NOTE — Assessment & Plan Note (Signed)
 She has lost 42 pounds over 10 months, approximately 15% of her body weight, despite challenges such as caring for a pet and traveling. She is managing her weight through lifestyle changes, including dietary adjustments and physical activity. She is currently on a calorie intake of 1500 calories per day, with a goal to maintain or further reduce this intake to continue weight loss. She is aware of the need to adjust her calorie intake as her weight decreases and is considering future use of GLP-1 agonists if cost becomes more manageable. - Continue current dietary and physical activity regimen. - Maintain calorie intake under 1500 calories per day for weight loss. - Consider GLP-1 agonists in the future if cost becomes more manageable.

## 2024-01-31 NOTE — Assessment & Plan Note (Signed)
 Her HOMA-IR is 2.48 which is mildly elevated. Optimal level < 1.9.   This is complex condition associated with genetics, ectopic fat and lifestyle factors. Insulin  resistance may result in increased fat storage, inhibition of the breakdown of fat, cause fluctuations in blood sugar leading to energy crashes and increased cravings for sugary or high carb foods and cause metabolic slowdown making it difficult to lose weight.  This may result in additional weight gain and lead to pre-diabetes and diabetes if untreated. In addition, hyperinsulinemia increases cardiovascular risk, chronic inflammatory response and may increase the risk of obesity related malignancies.  Lab Results  Component Value Date   HGBA1C 5.6 08/04/2023   Lab Results  Component Value Date   INSULIN  11.7 08/04/2023   INSULIN  9.9 08/12/2020   Lab Results  Component Value Date   GLUCOSE 82 01/25/2024   GLUCOSE 100 (H) 07/20/2018   She has stopped using metformin  due to recent elevations in liver enzymes which I do not think is medication induced but out of caution he would like to hold off medication.  We discussed GLP-1 as an alternative but at present time is cost prohibitive.

## 2024-01-31 NOTE — Progress Notes (Signed)
 Office: [phone]  /  Fax: [phone]  Weight Summary and Body Composition Analysis (BIA)  Vitals Temp: 97.7 F (36.5 C) BP: 126/86 Pulse Rate: 82 SpO2: 100 %   Anthropometric Measurements Height: 5' 7 (1.702 m) Weight: 233 lb (105.7 kg) BMI (Calculated): 36.48 Weight at Last Visit: 236 lb Weight Lost Since Last Visit: 3 lb Weight Gained Since Last Visit: 0 lb Starting Weight: 264 lb Total Weight Loss (lbs): 31 lb (14.1 kg) Peak Weight: 277 lb   Body Composition  Body Fat %: 47 % Fat Mass (lbs): 109.6 lbs Muscle Mass (lbs): 117.4 lbs Total Body Water (lbs): 88 lbs Visceral Fat Rating : 14    RMR: 1944  Today's Visit #: 7  Starting Date: 08/04/23   Subjective   Chief Complaint: Obesity  Interval History Discussed the use of AI scribe software for clinical note transcription with the patient, who gave verbal consent to proceed.  History of Present Illness Brittany Malone is a 58 year old female with rheumatoid arthritis, fatty liver, and prediabetes who presents for medical weight management.  Since last office visit she has lost 3 pounds she reports following a 1500-calorie plan with decreased adherence.  She is exercising 2 days a week 60 minutes doing cardio and strengthening.  She has lost 42 pounds over the past 10 months through lifestyle changes, including dietary modifications. Despite recent family commitments and a vacation, she continues to lose weight. She has not been exercising as much due to a knee injury but remains pleased with her progress.  She has been taking metformin , initially once a day for a week and then twice a day, but stopped a few days ago due to concerns about elevated ALT levels. She has a history of elevated liver enzymes and has previously taken Flagyl for colitis. An ultrasound in 2022 confirmed fatty liver. She plans to recheck her CMP in two to three weeks.  She is concerned about a potential flare-up of her rheumatoid  arthritis. She is currently taking methotrexate  and is worried that her rheumatologist might decrease the dose if her liver enzymes remain elevated. She has also been using Tylenol  for knee pain and had a glass of wine before her recent blood work.  She has not been taking her over-the-counter vitamin D  supplements since stopping the prescription. She is aware of the importance of vitamin D , especially in winter, and is considering resuming supplementation. She also notes that she had to restart amitriptyline  after previously stopping it.  She recently cared for a three-year-old and went on a family vacation. She plans to travel extensively next year. No significant changes in appetite or hunger while on metformin . Reports knee pain due to a recent injury.     Challenges affecting patient progress: medical comorbidities and menopause.    Pharmacotherapy for weight management: She is currently taking Metformin  (off label use for weight management and / or insulin  resistance and / or diabetes prevention) with adequate clinical response  and patient due to learns about liver enzymes.   Assessment and Plan   Treatment Plan For Obesity:  Recommended Dietary Goals  Brittany Malone is currently in the action stage of change. As such, her goal is to continue weight management plan. She has agreed to: continue current plan  Behavioral Health and Counseling  We discussed the following behavioral modification strategies today: continue to work on maintaining a reduced calorie state, getting the recommended amount of protein, incorporating whole foods, making healthy choices, staying well hydrated  and practicing mindfulness when eating. and increase protein intake, fibrous foods (25 grams per day for women, 30 grams for men) and water to improve satiety and decrease hunger signals. .  Additional education and resources provided today: Handout on traveling and holiday eating strategies  Recommended Physical  Activity Goals  Brittany Malone has been advised to work up to 150 minutes of moderate intensity aerobic activity a week and strengthening exercises 2-3 times per week for cardiovascular health, weight loss maintenance and preservation of muscle mass.  She has agreed to :  Think about enjoyable ways to increase daily physical activity and overcoming barriers to exercise, Increase physical activity in their day and reduce sedentary time (increase NEAT)., Increase volume of physical activity to a goal of 240 minutes a week, and Combine aerobic and strengthening exercises for efficiency and improved cardiometabolic health.  Medical Interventions and Pharmacotherapy  We discussed various medication options to help Brittany Malone with her weight loss efforts and we both agreed to : Was educated on GLP-1 receptor agonists, their role in managing obesity-related conditions and commonly associated side effects.  and would like to hold off restarting metformin  until repeat liver enzymes  Associated Conditions Impacted by Obesity Treatment  Assessment & Plan Class 2 severe obesity with serious comorbidity and body mass index (BMI) of 37.0 to 37.9 in adult, unspecified obesity type She has lost 42 pounds over 10 months, approximately 15% of her body weight, despite challenges such as caring for a pet and traveling. She is managing her weight through lifestyle changes, including dietary adjustments and physical activity. She is currently on a calorie intake of 1500 calories per day, with a goal to maintain or further reduce this intake to continue weight loss. She is aware of the need to adjust her calorie intake as her weight decreases and is considering future use of GLP-1 agonists if cost becomes more manageable. - Continue current dietary and physical activity regimen. - Maintain calorie intake under 1500 calories per day for weight loss. - Consider GLP-1 agonists in the future if cost becomes more manageable. Metabolic  dysfunction-associated steatotic liver disease (MASLD) Based on ultrasound from 2022.  Most recent liver enzymes were mildly elevated.   Fibrosis 4 Score = .92 (Low risk)        Interpretation for patients with NAFLD          <1.30       -  F0-F1 (Low risk)          1.30-2.67 -  Indeterminate           >2.67      -  F3-F4 (High risk)     Validated for ages 95-65       She continues to work on reducing simple and added sugars in diet and saturated fats.  Losing 15% of body weight may improve condition.   Chronic use of methotrexate  may cause hepatic steatosis.  Continue to monitor liver function.  Patient has decided to hold metformin  for now until liver enzymes are repeated.  We discussed medication safety and addressed concerns during this visit.  Weight gain due to medication Patient is on amitriptyline  and has been on medication for several years.  This medication may contribute to weight gain and may make weight loss difficult.  She will work with prescribing physician to find weight neutral alternative. Insulin  resistance Her HOMA-IR is 2.48 which is mildly elevated. Optimal level < 1.9.   This is complex condition associated with genetics, ectopic fat  and lifestyle factors. Insulin  resistance may result in increased fat storage, inhibition of the breakdown of fat, cause fluctuations in blood sugar leading to energy crashes and increased cravings for sugary or high carb foods and cause metabolic slowdown making it difficult to lose weight.  This may result in additional weight gain and lead to pre-diabetes and diabetes if untreated. In addition, hyperinsulinemia increases cardiovascular risk, chronic inflammatory response and may increase the risk of obesity related malignancies.  Lab Results  Component Value Date   HGBA1C 5.6 08/04/2023   Lab Results  Component Value Date   INSULIN  11.7 08/04/2023   INSULIN  9.9 08/12/2020   Lab Results  Component Value Date   GLUCOSE 82  01/25/2024   GLUCOSE 100 (H) 07/20/2018   She has stopped using metformin  due to recent elevations in liver enzymes which I do not think is medication induced but out of caution he would like to hold off medication.  We discussed GLP-1 as an alternative but at present time is cost prohibitive.  Vitamin D  deficiency She has vitamin D  deficiency and has not been taking supplements since stopping prescription vitamin D . She is aware of the risks of vitamin D  toxicity with high-dose supplementation and is considering over-the-counter options. - Consider taking over-the-counter vitamin D3, 2000 IU daily, or 5000 IU three times a week. - Will check vitamin D  levels at next visit if desired.          Objective   Physical Exam:  Blood pressure 126/86, pulse 82, temperature 97.7 F (36.5 C), height 5' 7 (1.702 m), weight 233 lb (105.7 kg), SpO2 100%. Body mass index is 36.49 kg/m.  General: She is overweight, cooperative, alert, well developed, and in no acute distress. PSYCH: Has normal mood, affect and thought process.   HEENT: EOMI, sclerae are anicteric. Lungs: Normal breathing effort, no conversational dyspnea. Extremities: No edema.  Neurologic: No gross sensory or motor deficits. No tremors or fasciculations noted.    Diagnostic Data Reviewed:  BMET    Component Value Date/Time   NA 140 01/25/2024 1207   K 4.3 01/25/2024 1207   CL 104 01/25/2024 1207   CO2 27 01/25/2024 1207   GLUCOSE 82 01/25/2024 1207   BUN 18 01/25/2024 1207   CREATININE 0.57 01/25/2024 1207   CALCIUM 9.6 01/25/2024 1207   GFRNONAA >60 12/15/2020 1901   GFRNONAA 98 08/26/2020 1607   GFRAA 114 08/26/2020 1607   Lab Results  Component Value Date   HGBA1C 5.6 08/04/2023   HGBA1C 5.4 06/16/2020   Lab Results  Component Value Date   INSULIN  11.7 08/04/2023   INSULIN  9.9 08/12/2020   Lab Results  Component Value Date   TSH 1.580 08/04/2023   CBC    Component Value Date/Time   WBC 5.5  01/25/2024 1207   RBC 4.79 01/25/2024 1207   HGB 14.0 01/25/2024 1207   HCT 42.6 01/25/2024 1207   PLT 233 01/25/2024 1207   MCV 88.9 01/25/2024 1207   MCH 29.2 01/25/2024 1207   MCHC 32.9 01/25/2024 1207   RDW 12.9 01/25/2024 1207   Iron Studies    Component Value Date/Time   IRON 116 04/16/2020 1227   FERRITIN 94.8 04/16/2020 1227   IRONPCTSAT 32.6 04/16/2020 1227   Lipid Panel     Component Value Date/Time   CHOL 177 08/04/2023 0853   TRIG 97 08/04/2023 0853   HDL 70 08/04/2023 0853   CHOLHDL 3 05/12/2022 0726   VLDL 18.6 05/12/2022 0726  LDLCALC 90 08/04/2023 0853   Hepatic Function Panel     Component Value Date/Time   PROT 6.8 01/25/2024 1207   ALBUMIN 3.9 05/12/2022 0726   AST 28 01/25/2024 1207   ALT 41 (H) 01/25/2024 1207   ALKPHOS 75 05/12/2022 0726   BILITOT 0.5 01/25/2024 1207   BILIDIR 0.1 12/22/2020 0753   IBILI 0.3 12/22/2020 0753      Component Value Date/Time   TSH 1.580 08/04/2023 0853   Nutritional Lab Results  Component Value Date   VD25OH 19.0 (L) 08/04/2023   VD25OH 19.4 (L) 08/12/2020    Medications: Outpatient Encounter Medications as of 01/31/2024  Medication Sig   acetaminophen  (TYLENOL ) 325 MG tablet Take 650 mg by mouth every 6 (six) hours as needed.   albuterol  (VENTOLIN  HFA) 108 (90 Base) MCG/ACT inhaler Inhale 2 puffs into the lungs every 6 (six) hours as needed for wheezing or shortness of breath.   amitriptyline  (ELAVIL ) 50 MG tablet TAKE 1 TABLET BY MOUTH AT BEDTIME AS NEEDED FOR SLEEP   beclomethasone (QVAR  REDIHALER) 40 MCG/ACT inhaler Inhale 2 puffs into the lungs 2 (two) times daily. Rinse mouth out after use.   fluticasone (FLONASE) 50 MCG/ACT nasal spray Place into both nostrils as needed for allergies or rhinitis.   folic acid  (FOLVITE ) 1 MG tablet Take 2 tablets (2 mg total) by mouth daily.   hydroxychloroquine  (PLAQUENIL ) 200 MG tablet Take 1 tablet (200 mg total) by mouth daily.   MELATONIN PO Take 10 mg by mouth  at bedtime.   metFORMIN  (GLUCOPHAGE -XR) 500 MG 24 hr tablet Take 1 tablet (500 mg total) by mouth 2 (two) times daily with a meal.   methotrexate  50 MG/2ML injection Inject 1 mL (25 mg total) into the skin once a week. ROTATE INJECTION SITES. KEEP AT ROOM TEMPERATURE. PROTECT FROM LIGHT.   polyethylene glycol (MIRALAX  / GLYCOLAX ) 17 g packet Take 17 g by mouth daily as needed.   Tuberculin-Allergy  Syringes 27G X 1/2 1 ML MISC Use to inject methotrexate  once weekly. DO NOT REUSE.   Vitamin D , Ergocalciferol , (DRISDOL ) 1.25 MG (50000 UNIT) CAPS capsule Take 1 capsule (50,000 Units total) by mouth every 7 (seven) days. (Patient not taking: Reported on 01/31/2024)   No facility-administered encounter medications on file as of 01/31/2024.     Follow-Up   Return in about 5 weeks (around 03/06/2024) for For Weight Mangement with Dr. Francyne.SABRA She was informed of the importance of frequent follow up visits to maximize her success with intensive lifestyle modifications for her multiple health conditions.  Attestation Statement   Reviewed by clinician on day of visit: allergies, medications, problem list, medical history, surgical history, family history, social history, and previous encounter notes.     Lucas Francyne, MD

## 2024-01-31 NOTE — Assessment & Plan Note (Signed)
 She has vitamin D  deficiency and has not been taking supplements since stopping prescription vitamin D . She is aware of the risks of vitamin D  toxicity with high-dose supplementation and is considering over-the-counter options. - Consider taking over-the-counter vitamin D3, 2000 IU daily, or 5000 IU three times a week. - Will check vitamin D  levels at next visit if desired.

## 2024-02-02 ENCOUNTER — Other Ambulatory Visit (INDEPENDENT_AMBULATORY_CARE_PROVIDER_SITE_OTHER): Payer: Self-pay | Admitting: Internal Medicine

## 2024-02-02 ENCOUNTER — Other Ambulatory Visit: Payer: Self-pay

## 2024-02-02 ENCOUNTER — Other Ambulatory Visit (HOSPITAL_BASED_OUTPATIENT_CLINIC_OR_DEPARTMENT_OTHER): Payer: Self-pay

## 2024-02-02 ENCOUNTER — Other Ambulatory Visit: Payer: Self-pay | Admitting: Rheumatology

## 2024-02-02 DIAGNOSIS — M0579 Rheumatoid arthritis with rheumatoid factor of multiple sites without organ or systems involvement: Secondary | ICD-10-CM

## 2024-02-02 DIAGNOSIS — Z6837 Body mass index (BMI) 37.0-37.9, adult: Secondary | ICD-10-CM

## 2024-02-02 DIAGNOSIS — H2013 Chronic iridocyclitis, bilateral: Secondary | ICD-10-CM

## 2024-02-02 MED ORDER — METHOTREXATE SODIUM CHEMO INJECTION 50 MG/2ML
25.0000 mg | INTRAMUSCULAR | 0 refills | Status: AC
  Filled 2024-02-02: qty 12, 84d supply, fill #0

## 2024-02-02 NOTE — Telephone Encounter (Signed)
 Last Fill: 11/17/2023   Labs: 11/26/2025Advised patient that CBC WNL ALT is borderline elevated-please clarify if she has been taking any tylenol ? Alcohol use? Rest of CMP WNL   Patient verbalized understanding and states she had been taking a little more tylenol  than she usually does.  Plan to recheck labs in 2-3 weeks.   Next Visit: 03/08/2024  Last Visit: 09/29/2023  DX: Rheumatoid arthritis involving multiple sites with positive rheumatoid factor   Current Dose per office note on 09/29/2023: Otrexup  25 mg sq  injections once weekly,   Vial and syringe methotrexate  was starting in 10/2023 per telephone encounter on 11/15/2023.   Okay to refill Methotrexate ?

## 2024-02-03 ENCOUNTER — Other Ambulatory Visit (HOSPITAL_BASED_OUTPATIENT_CLINIC_OR_DEPARTMENT_OTHER): Payer: Self-pay

## 2024-02-12 ENCOUNTER — Encounter (HOSPITAL_COMMUNITY): Payer: Self-pay

## 2024-02-12 ENCOUNTER — Ambulatory Visit (HOSPITAL_COMMUNITY)
Admission: RE | Admit: 2024-02-12 | Discharge: 2024-02-12 | Disposition: A | Discharge status: Home / Self Care | Source: Ambulatory Visit | Attending: Physician Assistant | Admitting: Physician Assistant

## 2024-02-12 ENCOUNTER — Ambulatory Visit (HOSPITAL_COMMUNITY)

## 2024-02-12 ENCOUNTER — Ambulatory Visit (HOSPITAL_COMMUNITY): Payer: Self-pay | Admitting: Physician Assistant

## 2024-02-12 VITALS — BP 120/56 | HR 66 | Temp 98.4°F | Resp 17 | Ht 67.5 in | Wt 232.0 lb

## 2024-02-12 DIAGNOSIS — J40 Bronchitis, not specified as acute or chronic: Secondary | ICD-10-CM

## 2024-02-12 DIAGNOSIS — J329 Chronic sinusitis, unspecified: Secondary | ICD-10-CM | POA: Diagnosis not present

## 2024-02-12 DIAGNOSIS — R062 Wheezing: Secondary | ICD-10-CM | POA: Diagnosis not present

## 2024-02-12 DIAGNOSIS — R059 Cough, unspecified: Secondary | ICD-10-CM | POA: Diagnosis not present

## 2024-02-12 MED ORDER — METHYLPREDNISOLONE SODIUM SUCC 125 MG IJ SOLR
INTRAMUSCULAR | Status: AC
  Filled 2024-02-12: qty 2

## 2024-02-12 MED ORDER — PROMETHAZINE-DM 6.25-15 MG/5ML PO SYRP: 5.0000 mL | ORAL_SOLUTION | Freq: Two times a day (BID) | ORAL | 0 refills | Status: DC | PRN

## 2024-02-12 MED ORDER — IPRATROPIUM-ALBUTEROL 0.5-2.5 (3) MG/3ML IN SOLN
RESPIRATORY_TRACT | Status: AC
  Filled 2024-02-12: qty 3

## 2024-02-12 MED ORDER — METHYLPREDNISOLONE SODIUM SUCC 125 MG IJ SOLR
60.0000 mg | Freq: Once | INTRAMUSCULAR | Status: AC
  Administered 2024-02-12: 60 mg via INTRAMUSCULAR

## 2024-02-12 MED ORDER — DOXYCYCLINE HYCLATE 100 MG PO CAPS: 100.0000 mg | ORAL_CAPSULE | Freq: Two times a day (BID) | ORAL | 0 refills | Status: DC

## 2024-02-12 MED ORDER — IPRATROPIUM-ALBUTEROL 0.5-2.5 (3) MG/3ML IN SOLN
3.0000 mL | Freq: Once | RESPIRATORY_TRACT | Status: AC
  Administered 2024-02-12: 3 mL via RESPIRATORY_TRACT

## 2024-02-12 MED ORDER — PREDNISONE 20 MG PO TABS: 40.0000 mg | ORAL_TABLET | Freq: Every day | ORAL | 0 refills | Status: AC

## 2024-02-12 NOTE — ED Provider Notes (Signed)
 [facility]    CSN: [id number] Arrival date & time: 02/12/24  1757      History   Chief Complaint Chief Complaint  Patient presents with   Cough    Sinus and chest congestion/cough x8 days - Entered by patient    HPI Brittany Malone is a 58 y.o. female.   Patient presents today with a week plus long history of URI symptoms.  She reports congestion, sore throat, headache, bilateral ear fullness, acute cough, wheezing, shortness of breath.  She does have a history of recurrent sinus infections that generally occur every year around this time.  She has been using over-the-counter medications including Sudafed, Mucinex, ibuprofen  without improvement of symptoms.  She denies any known sick contacts.  She does have seasonal allergies but denies history of chronic lung conditions such as asthma or COPD.  She has been given albuterol  as well as Qvar  in the past but denies formal diagnosis of chronic lung condition.  She does not smoke.  She has been using both of these medications with them for improvement of symptoms.  Denies any recent antibiotics or steroids.  Denies history of diabetes.  She is having difficulty with her daily activities because of the severity of cough.    Past Medical History:  Diagnosis Date   Allergy     SEASONAL   Anemia    Arthritis    Back pain    Back pain    Chronic vertigo    Degenerative joint disease of cervical and lumbar spine    Diverticulitis    Fatty liver    per patient   GERD (gastroesophageal reflux disease)    Insomnia    Joint pain    Joint pain    NAFLD (nonalcoholic fatty liver disease)    Osteoarthritis    Rheumatoid arthritis (HCC)     Patient Active Problem List   Diagnosis Date Noted   Insulin  resistance 08/17/2023   Vitamin D  deficiency 08/04/2023   Weight gain due to medication 08/04/2023   Obesity, unspecified 08/04/2023   Primary osteoarthritis of both knees 02/23/2023   DDD (degenerative disc disease),  cervical 02/23/2023   Degeneration of intervertebral disc of lumbar region without discogenic back pain or lower extremity pain 02/23/2023   Metabolic dysfunction-associated steatotic liver disease (MASLD)    GERD (gastroesophageal reflux disease)    Rheumatoid arthritis involving multiple sites with positive rheumatoid factor (HCC) 08/15/2018   Family history of rheumatoid arthritis 08/15/2018   Chronic uveitis of both eyes 08/15/2018   Pes cavus 08/15/2018   History of gastroesophageal reflux (GERD) 08/15/2018    Past Surgical History:  Procedure Laterality Date   ABDOMINAL HYSTERECTOMY     ANKLE SURGERY     COLONOSCOPY  01/18/2020   UPPER GI ENDOSCOPY      OB History     Gravida  3   Para  3   Term  3   Preterm      AB      Living         SAB      IAB      Ectopic      Multiple      Live Births               Home Medications    Prior to Admission medications  Medication Sig Start Date End Date Taking? Authorizing Provider  acetaminophen  (TYLENOL ) 325 MG tablet Take 650 mg by mouth every 6 (six) hours as needed.  Yes [provider]  albuterol  (VENTOLIN  HFA) 108 (90 Base) MCG/ACT inhaler Inhale 2 puffs into the lungs every 6 (six) hours as needed for wheezing or shortness of breath. 05/26/23  Yes Rising, Asberry, PA-C  amitriptyline  (ELAVIL ) 50 MG tablet TAKE 1 TABLET BY MOUTH AT BEDTIME AS NEEDED FOR SLEEP 08/29/23  Yes Frann Mabel Mt, DO  beclomethasone (QVAR  REDIHALER) 40 MCG/ACT inhaler Inhale 2 puffs into the lungs 2 (two) times daily. Rinse mouth out after use. 03/25/23  Yes [name], Mabel Mt, DO  doxycycline  (VIBRAMYCIN ) 100 MG capsule Take 1 capsule (100 mg total) by mouth 2 (two) times daily. 02/12/24  Yes Azarah Dacy K, PA-C  fluticasone (FLONASE) 50 MCG/ACT nasal spray Place into both nostrils as needed for allergies or rhinitis.   Yes [provider]  folic acid  (FOLVITE ) 1 MG tablet Take 2 tablets (2 mg total)  by mouth daily. 09/29/23  Yes Cheryl Waddell HERO, PA-C  hydroxychloroquine  (PLAQUENIL ) 200 MG tablet Take 1 tablet (200 mg total) by mouth daily. 09/29/23  Yes Cheryl Waddell HERO, PA-C  MELATONIN PO Take 10 mg by mouth at bedtime.   Yes [provider]  metFORMIN  (GLUCOPHAGE -XR) 500 MG 24 hr tablet Take 1 tablet (500 mg total) by mouth 2 (two) times daily with a meal. 12/27/23  Yes Francyne Romano, MD  methotrexate  50 MG/2ML injection Inject 1 mL (25 mg total) into the skin once a week. ROTATE INJECTION SITES. KEEP AT ROOM TEMPERATURE. PROTECT FROM LIGHT. 02/02/24  Yes Cheryl Waddell HERO, PA-C  polyethylene glycol (MIRALAX  / GLYCOLAX ) 17 g packet Take 17 g by mouth daily as needed. 09/30/20  Yes Opalski, Barnie, DO  predniSONE  (DELTASONE ) 20 MG tablet Take 2 tablets (40 mg total) by mouth daily for 5 days. 02/12/24 02/17/24 Yes Tami Barren K, PA-C  promethazine -dextromethorphan (PROMETHAZINE -DM) 6.25-15 MG/5ML syrup Take 5 mLs by mouth 2 (two) times daily as needed for cough. 02/12/24  Yes Londell Noll, Rocky POUR, PA-C  Tuberculin-Allergy  Syringes 27G X 1/2 1 ML MISC Use to inject methotrexate  once weekly. DO NOT REUSE. 11/17/23  Yes Deveshwar, Maya, MD    Family History Family History  Problem Relation Age of Onset   Lupus Mother    Fibromyalgia Mother    Hyperlipidemia Mother    Hypertension Father    High Cholesterol Father    Hyperlipidemia Father    Colon polyps Father    Kidney Stones Brother    Healthy Son    Hypertension Daughter    Migraines Daughter    Colon cancer Neg Hx    Esophageal cancer Neg Hx    Rectal cancer Neg Hx    Stomach cancer Neg Hx     Social History Social History[1]   Allergies   Penicillins   Review of Systems Review of Systems  Constitutional:  Positive for activity change. Negative for appetite change, fatigue and fever.  HENT:  Positive for congestion and sore throat. Negative for sinus pressure and sneezing.   Respiratory:  Positive for cough,  shortness of breath and wheezing. Negative for chest tightness.   Cardiovascular:  Negative for chest pain.  Gastrointestinal:  Negative for diarrhea, nausea and vomiting.  Neurological:  Negative for dizziness, light-headedness and headaches.     Physical Exam Triage Vital Signs ED Triage Vitals  Encounter Vitals Group     BP 02/12/24 1836 (!) 120/56     Girls Systolic BP Percentile --      Girls Diastolic BP Percentile --  Boys Systolic BP Percentile --      Boys Diastolic BP Percentile --      Pulse Rate 02/12/24 1836 66     Resp 02/12/24 1836 17     Temp 02/12/24 1836 98.4 F (36.9 C)     Temp Source 02/12/24 1836 Oral     SpO2 02/12/24 1836 98 %     Weight 02/12/24 1834 232 lb (105.2 kg)     Height 02/12/24 1834 5' 7.5 (1.715 m)     Head Circumference --      Peak Flow --      Pain Score 02/12/24 1834 4     Pain Loc --      Pain Education --      Exclude from Growth Chart --    No data found.  Updated Vital Signs BP (!) 120/56 (BP Location: Right Arm)   Pulse 66   Temp 98.4 F (36.9 C) (Oral)   Resp 17   Ht 5' 7.5 (1.715 m)   Wt 232 lb (105.2 kg)   SpO2 98%   BMI 35.80 kg/m   Visual Acuity Right Eye Distance:   Left Eye Distance:   Bilateral Distance:    Right Eye Near:   Left Eye Near:    Bilateral Near:     Physical Exam Vitals reviewed.  Constitutional:      General: She is awake. She is not in acute distress.    Appearance: Normal appearance. She is well-developed. She is not ill-appearing.     Comments: Very pleasant female appears stated age in no acute distress sitting comfortably in exam room  HENT:     Head: Normocephalic and atraumatic.     Right Ear: Tympanic membrane, ear canal and external ear normal. Tympanic membrane is not erythematous or bulging.     Left Ear: Tympanic membrane, ear canal and external ear normal. Tympanic membrane is not erythematous or bulging.     Nose:     Right Sinus: No maxillary sinus tenderness or  frontal sinus tenderness.     Left Sinus: No maxillary sinus tenderness or frontal sinus tenderness.     Mouth/Throat:     Pharynx: Uvula midline. No oropharyngeal exudate or posterior oropharyngeal erythema.  Cardiovascular:     Rate and Rhythm: Normal rate and regular rhythm.     Heart sounds: Normal heart sounds, S1 normal and S2 normal. No murmur heard. Pulmonary:     Effort: Pulmonary effort is normal.     Breath sounds: Wheezing present. No rhonchi or rales.     Comments: Wheezing improved but did not resolve following DuoNeb in clinic. Psychiatric:        Behavior: Behavior is cooperative.      UC Treatments / Results  Labs (all labs ordered are listed, but only abnormal results are displayed) Labs Reviewed - No data to display  EKG   Radiology DG Chest 2 View Result Date: 02/12/2024 CLINICAL DATA:  worsening cough, wheezing EXAM: CHEST - 2 VIEW COMPARISON:  March 04, 2023, October 06, 2021 FINDINGS: The cardiomediastinal silhouette is unchanged in contour. No pleural effusion. No pneumothorax. No acute pleuroparenchymal abnormality. Visualized abdomen is unremarkable. Multilevel degenerative changes of the thoracic spine. IMPRESSION: No acute cardiopulmonary abnormality. Electronically Signed   By: Corean Salter M.D.   On: 02/12/2024 19:30    Procedures Procedures (including critical care time)  Medications Ordered in UC Medications  ipratropium-albuterol  (DUONEB) 0.5-2.5 (3) MG/3ML nebulizer solution 3 mL (3 mLs Nebulization  Given 02/12/24 1855)  methylPREDNISolone  sodium succinate (SOLU-MEDROL ) 125 mg/2 mL injection 60 mg (60 mg Intramuscular Given 02/12/24 1907)    Initial Impression / Assessment and Plan / UC Course  I have reviewed the triage vital signs and the nursing notes.  Pertinent labs & imaging results that were available during my care of the patient were reviewed by me and considered in my medical decision making (see chart for details).      Patient is well-appearing, afebrile, nontoxic, nontachycardic.  No indication for viral testing as patient has been symptomatic for over a week and this would not change management.  Chest x-ray was obtained that showed no acute cardiopulmonary disease with my primary read and radiologist overread was in agreement.  Given her prolonged and worsening symptoms will cover for secondary bacterial infection with doxycycline  100 mg twice daily for 10 days.  Discussed that she is to avoid prolonged sun exposure while on this medication.  She was given DuoNeb and 60 mg of Solu-Medrol  in clinic with improvement of symptoms.  Will start prednisone  burst of 40 mg for 5 days starting tomorrow (02/13/2024) and we discussed that she is not to take NSAIDs with this medication due to risk of GI bleeding.  She has plenty of albuterol  and Qvar  at home and was encouraged to use these medications as previously recommended.  She was given Promethazine  DM for cough and we discussed that this can be sedating and she is not to drive or drink alcohol while taking this medication.  If she is not feeling better within 3 to 5 days or if anything worsens she is to return for reevaluation.  Strict return precautions given.  Excuse note provided.  Final Clinical Impressions(s) / UC Diagnoses   Final diagnoses:  Sinobronchitis  Wheezing     Discharge Instructions      I do not see evidence of pneumonia on your chest x-ray but we will contact you if the radiologist sees something that I did not and we need to change our treatment plan.  We are treating you for a sinus/bronchitis infection.  Start doxycycline  100 mg twice daily for 10 days.  Stay out of the sun while on this medication.  We gave an injection of steroids today and I would like you to start prednisone  tomorrow (02/13/2024).  Do not take NSAIDs with this medication including aspirin, ibuprofen /Advil , naproxen/Aleve.  You can use Tylenol , Mucinex, Flonase, nasal  saline/sinus rinses for additional symptom relief.  I have called in a cough medicine that will help you sleep.  This will make you sleepy so do not drive or drink alcohol taking it.  Make sure that you rest and drink plenty of fluid.  If you are not feeling better within a week please return for reevaluation.  If anything worsens and you have high fever, worsening cough, shortness of breath despite the medication, weakness you need to be seen immediately.     ED Prescriptions     Medication Sig Dispense Auth. Provider   predniSONE  (DELTASONE ) 20 MG tablet Take 2 tablets (40 mg total) by mouth daily for 5 days. 10 tablet Christpoher Sievers K, PA-C   promethazine -dextromethorphan (PROMETHAZINE -DM) 6.25-15 MG/5ML syrup Take 5 mLs by mouth 2 (two) times daily as needed for cough. 118 mL Sharonann Malbrough K, PA-C   doxycycline  (VIBRAMYCIN ) 100 MG capsule Take 1 capsule (100 mg total) by mouth 2 (two) times daily. 20 capsule Olita Takeshita K, PA-C      PDMP not  reviewed this encounter.    [1]  Social History Tobacco Use   Smoking status: Never    Passive exposure: Past   Smokeless tobacco: Never  Vaping Use   Vaping status: Never Used  Substance Use Topics   Alcohol use: Yes    Comment: rarely    Drug use: Never     Sherrell Rocky POUR, PA-C 02/12/24 1934

## 2024-02-12 NOTE — Discharge Instructions (Signed)
 I do not see evidence of pneumonia on your chest x-ray but we will contact you if the radiologist sees something that I did not and we need to change our treatment plan.  We are treating you for a sinus/bronchitis infection.  Start doxycycline  100 mg twice daily for 10 days.  Stay out of the sun while on this medication.  We gave an injection of steroids today and I would like you to start prednisone  tomorrow (02/13/2024).  Do not take NSAIDs with this medication including aspirin, ibuprofen /Advil , naproxen/Aleve.  You can use Tylenol , Mucinex, Flonase, nasal saline/sinus rinses for additional symptom relief.  I have called in a cough medicine that will help you sleep.  This will make you sleepy so do not drive or drink alcohol taking it.  Make sure that you rest and drink plenty of fluid.  If you are not feeling better within a week please return for reevaluation.  If anything worsens and you have high fever, worsening cough, shortness of breath despite the medication, weakness you need to be seen immediately.

## 2024-02-12 NOTE — ED Triage Notes (Signed)
 Pt states that she has nasal congestion, headache, sore throat , bilateral ear pain and cough. X1 week  Pt states that she also has chest congestion.  Pt states that she has taken Sudafed, Mucinex and Ibuprofen  otc.

## 2024-02-20 DIAGNOSIS — Z79899 Other long term (current) drug therapy: Secondary | ICD-10-CM

## 2024-02-21 ENCOUNTER — Other Ambulatory Visit (HOSPITAL_BASED_OUTPATIENT_CLINIC_OR_DEPARTMENT_OTHER): Payer: Self-pay

## 2024-02-21 DIAGNOSIS — Z79899 Other long term (current) drug therapy: Secondary | ICD-10-CM | POA: Diagnosis not present

## 2024-02-22 ENCOUNTER — Ambulatory Visit: Payer: Self-pay | Admitting: Rheumatology

## 2024-02-22 LAB — COMPREHENSIVE METABOLIC PANEL WITH GFR
AG Ratio: 1.5 (calc) (ref 1.0–2.5)
ALT: 21 U/L (ref 6–29)
AST: 14 U/L (ref 10–35)
Albumin: 3.7 g/dL (ref 3.6–5.1)
Alkaline phosphatase (APISO): 76 U/L (ref 37–153)
BUN: 15 mg/dL (ref 7–25)
CO2: 27 mmol/L (ref 20–32)
Calcium: 9 mg/dL (ref 8.6–10.4)
Chloride: 104 mmol/L (ref 98–110)
Creat: 0.56 mg/dL (ref 0.50–1.05)
Globulin: 2.4 g/dL (ref 1.9–3.7)
Glucose, Bld: 87 mg/dL (ref 65–139)
Potassium: 4.1 mmol/L (ref 3.5–5.3)
Sodium: 139 mmol/L (ref 135–146)
Total Bilirubin: 0.6 mg/dL (ref 0.2–1.2)
Total Protein: 6.1 g/dL (ref 6.1–8.1)
eGFR: 104 mL/min/1.73m2

## 2024-02-27 NOTE — Progress Notes (Signed)
 "  Office Visit Note  Patient: Brittany Malone             Date of Birth: 1965/12/04           MRN: [id number]             PCP: Frann Mabel Mt, DO Referring: Frann Mabel Mt, DO Visit Date: 03/08/2024 Occupation: RN  Subjective:  Pain of the Left Foot   History of Present Illness: Brittany Malone is a 58 y.o. female with seropositive rheumatoid arthritis, uveitis, osteoarthritis and degenerative disc disease.  She returns today after her last visit in July 2025.  She states she has been having increased pain and discomfort in her left fourth toe since November 2025.  She did not recall any injury.  She continues to have discomfort.  She has off-and-on discomfort in her knee joints.  She has been going to [facility] for lower back pain.  She gets radiofrequency ablation which helps.  Her last treatment was in April.  She has some stiffness in her neck.  She has not noticed any joint swelling.  She has been on methotrexate  25 mg subcu weekly along with Plaquenil  200 mg p.o. daily.  She is also on folic acid  2 mg daily.  States on Labor Day weekend she fell and injured her right fifth finger.  She was seen at orthopedics and was diagnosed with a fracture.  She still has some soreness in that region.    Activities of Daily Living:  Patient reports morning stiffness for 20-30 minutes.   Patient Reports nocturnal pain.  Difficulty dressing/grooming: Denies Difficulty climbing stairs: Denies Difficulty getting out of chair: Denies Difficulty using hands for taps, buttons, cutlery, and/or writing: Denies  Review of Systems  Constitutional:  Positive for fatigue.  HENT:  Positive for mouth dryness. Negative for mouth sores.   Eyes:  Negative for dryness.  Respiratory:  Negative for shortness of breath.   Cardiovascular:  Negative for chest pain and palpitations.  Gastrointestinal:  Negative for blood in stool, constipation and diarrhea.  Endocrine: Negative for  increased urination.  Genitourinary:  Negative for involuntary urination.  Musculoskeletal:  Positive for joint pain, joint pain, joint swelling and morning stiffness. Negative for gait problem, myalgias, muscle weakness, muscle tenderness and myalgias.  Skin:  Negative for color change, rash, hair loss and sensitivity to sunlight.  Allergic/Immunologic: Negative for susceptible to infections.  Neurological:  Negative for dizziness and headaches.  Hematological:  Negative for swollen glands.  Psychiatric/Behavioral:  Positive for sleep disturbance. Negative for depressed mood. The patient is not nervous/anxious.     PMFS History:  Patient Active Problem List   Diagnosis Date Noted   Insulin  resistance 08/17/2023   Vitamin D  deficiency 08/04/2023   Weight gain due to medication 08/04/2023   Obesity, unspecified 08/04/2023   Primary osteoarthritis of both knees 02/23/2023   DDD (degenerative disc disease), cervical 02/23/2023   Degeneration of intervertebral disc of lumbar region without discogenic back pain or lower extremity pain 02/23/2023   Metabolic dysfunction-associated steatotic liver disease (MASLD)    GERD (gastroesophageal reflux disease)    Rheumatoid arthritis involving multiple sites with positive rheumatoid factor (HCC) 08/15/2018   Family history of rheumatoid arthritis 08/15/2018   Chronic uveitis of both eyes 08/15/2018   Pes cavus 08/15/2018   History of gastroesophageal reflux (GERD) 08/15/2018    Past Medical History:  Diagnosis Date   Allergy     SEASONAL   Anemia    Arthritis  Back pain    Back pain    Chronic vertigo    Degenerative joint disease of cervical and lumbar spine    Diverticulitis    Fatty liver    per patient   GERD (gastroesophageal reflux disease)    Insomnia    Joint pain    Joint pain    NAFLD (nonalcoholic fatty liver disease)    Osteoarthritis    Rheumatoid arthritis (HCC)     Family History  Problem Relation Age of Onset    Lupus Mother    Fibromyalgia Mother    Hyperlipidemia Mother    Hypertension Father    High Cholesterol Father    Hyperlipidemia Father    Colon polyps Father    Kidney Stones Brother    Healthy Son    Hypertension Daughter    Migraines Daughter    Colon cancer Neg Hx    Esophageal cancer Neg Hx    Rectal cancer Neg Hx    Stomach cancer Neg Hx    Past Surgical History:  Procedure Laterality Date   ABDOMINAL HYSTERECTOMY     ANKLE SURGERY     COLONOSCOPY  01/18/2020   UPPER GI ENDOSCOPY     Social History[1] Social History   Social History Narrative   Not on file     Immunization History  Administered Date(s) Administered   Influenza,inj,Quad PF,6+ Mos 11/16/2018   Influenza-Unspecified 01/13/2022, 12/25/2022   Moderna Sars-Covid-2 Vaccination 03/08/2019, 04/05/2019, 11/23/2019, 06/02/2020, 03/21/2021   Pfizer Covid-19 Vaccine Bivalent Booster 5y-11y 12/25/2022   Pfizer(Comirnaty)Fall Seasonal Vaccine 12 years and older 01/13/2022   Pneumococcal Polysaccharide-23 11/16/2018   Tdap 05/16/2010, 06/16/2020   Zoster Recombinant(Shingrix ) 11/16/2018, 01/16/2019     Objective: Vital Signs: BP 125/86   Pulse 87   Temp 97.9 F (36.6 C)   Resp 14   Ht 5' 7.5 (1.715 m)   Wt 240 lb 12.8 oz (109.2 kg)   BMI 37.16 kg/m    Physical Exam Vitals and nursing note reviewed.  Constitutional:      Appearance: She is well-developed.  HENT:     Head: Normocephalic and atraumatic.  Eyes:     Conjunctiva/sclera: Conjunctivae normal.  Cardiovascular:     Rate and Rhythm: Normal rate and regular rhythm.     Heart sounds: Normal heart sounds.  Pulmonary:     Effort: Pulmonary effort is normal.     Breath sounds: Normal breath sounds.  Abdominal:     General: Bowel sounds are normal.     Palpations: Abdomen is soft.  Musculoskeletal:     Cervical back: Normal range of motion.  Lymphadenopathy:     Cervical: No cervical adenopathy.  Skin:    General: Skin is warm and  dry.     Capillary Refill: Capillary refill takes less than 2 seconds.  Neurological:     Mental Status: She is alert and oriented to person, place, and time.  Psychiatric:        Behavior: Behavior normal.      Musculoskeletal Exam: Cervical, thoracic and lumbar spine were in good range of motion.  There was no SI joint tenderness.  Shoulder joints, elbow joints, wrist joints, MCPs, PIPs and DIPs were in good range of motion with no synovitis.  Hip joints and knee joints were in good range of motion without any warmth swelling or effusion.  She had thickening of her left fourth DIP joint with some warmth and redness.  CMC there was no tenderness over ankles.  CDAI Exam: CDAI Score: -- Patient Global: --; Provider Global: -- Swollen: --; Tender: -- Joint Exam 03/08/2024   No joint exam has been documented for this visit   There is currently no information documented on the homunculus. Go to the Rheumatology activity and complete the homunculus joint exam.  Investigation: No additional findings.  Imaging: DG Chest 2 View Result Date: 02/12/2024 CLINICAL DATA:  worsening cough, wheezing EXAM: CHEST - 2 VIEW COMPARISON:  March 04, 2023, October 06, 2021 FINDINGS: The cardiomediastinal silhouette is unchanged in contour. No pleural effusion. No pneumothorax. No acute pleuroparenchymal abnormality. Visualized abdomen is unremarkable. Multilevel degenerative changes of the thoracic spine. IMPRESSION: No acute cardiopulmonary abnormality. Electronically Signed   By: Corean Salter M.D.   On: 02/12/2024 19:30    Recent Labs: Lab Results  Component Value Date   WBC 5.5 01/25/2024   HGB 14.0 01/25/2024   PLT 233 01/25/2024   NA 139 02/21/2024   K 4.1 02/21/2024   CL 104 02/21/2024   CO2 27 02/21/2024   GLUCOSE 87 02/21/2024   BUN 15 02/21/2024   CREATININE 0.56 02/21/2024   BILITOT 0.6 02/21/2024   ALKPHOS 75 05/12/2022   AST 14 02/21/2024   ALT 21 02/21/2024   PROT 6.1  02/21/2024   ALBUMIN 3.9 05/12/2022   CALCIUM 9.0 02/21/2024   GFRAA 114 08/26/2020   QFTBGOLDPLUS NEGATIVE 11/20/2019    Speciality Comments: PLQ Eye Exam: 10/19/2022 WNL with Pre-existing Toxo Scars @ [facility] Follow up 1 year  Procedures:  No procedures performed Allergies: Penicillins   Assessment / Plan:     Visit Diagnoses: Rheumatoid arthritis involving multiple sites with positive rheumatoid factor (HCC) -patient denies having a flare of rheumatoid arthritis on the combination of methotrexate  and Plaquenil .  She has been tolerating medications well.  She has been experiencing some pain and discomfort in her left fourth toe for the last 6 weeks.  She does not recall trauma.  Will obtain x-rays to monitor for radiographic progression.  Plan: XR Hand 2 View Right, XR Hand 2 View Left, XR Foot 2 Views Right.  X-rays of the bilateral hands showed osteoarthritic changes with no radiographic progression when compared to the x-rays of 2020.  X-rays of the right foot showed no radiographic progression and osteoarthritic changes were noted.  X-ray findings were reviewed with the patient.  High risk medication use - MTX 25 mg sq  injections once weekly, Plaquenil  200 mg 1 tablet by mouth daily, and folic acid  2 mg daily.PLQ Eye Exam: 08/25.  Patient will have eye exam form forwarded to us .  Labs from November 2025 CBC was normal.  CMP was normal on February 21, 2024.  Information regarding immunization was placed in the AVS.  Patient was advised to hold methotrexate  if she develops an infection resume after the infection resolves.  Chronic uveitis of both eyes-she denies any recurrence of uveitis.  Primary osteoarthritis of both knees-she has intermittent discomfort.  Pain in left foot -she has been having pain and discomfort in her left fourth toe.  Some swelling of the left fourth toe over the DIP joint was noted.  She had tenderness on palpation.  Plan: XR Foot 2 Views Left,  x-rays obtained today showed osteoarthritic changes with joint space narrowing.  No erosive changes were noted.  No fracture was noted.  X-ray findings were reviewed with the patient.  I will check uric acid level however it would be unusual in that joint.  Plan: Uric  acid.  Patient was advised to buddy tape her 3rd and 4th toe.  Will call her when the uric acid results are available.  Chronic SI joint pain-she has intermittent pain in the SI joints.  Pes cavus-arch support was advised.  DDD (degenerative disc disease), cervical-no limitation with range of motion noted.  Patient denies any radiculopathy.  Degeneration of intervertebral disc of lumbar region without discogenic back pain or lower extremity pain - She had an ablation performed in May 2025.  She is chronic intermittent pain.  Family history of systemic lupus erythematosus (SLE) in mother  History of melanoma  History of diverticulosis  History of gastroesophageal reflux (GERD)  Orders: Orders Placed This Encounter  Procedures   XR Hand 2 View Right   XR Hand 2 View Left   XR Foot 2 Views Right   XR Foot 2 Views Left   Uric acid   No orders of the defined types were placed in this encounter.    Follow-Up Instructions: Return in about 5 months (around 08/06/2024) for Rheumatoid arthritis.   Maya Nash, MD  Note - This record has been created using Animal nutritionist.  Chart creation errors have been sought, but may not always  have been located. Such creation errors do not reflect on  the standard of medical care.     [1]  Social History Tobacco Use   Smoking status: Never    Passive exposure: Past   Smokeless tobacco: Never  Vaping Use   Vaping status: Never Used  Substance Use Topics   Alcohol use: Yes    Comment: occ   Drug use: Never   "

## 2024-03-06 ENCOUNTER — Ambulatory Visit (INDEPENDENT_AMBULATORY_CARE_PROVIDER_SITE_OTHER): Admitting: Internal Medicine

## 2024-03-08 ENCOUNTER — Encounter: Payer: Self-pay | Admitting: Rheumatology

## 2024-03-08 ENCOUNTER — Ambulatory Visit: Attending: Rheumatology | Admitting: Rheumatology

## 2024-03-08 ENCOUNTER — Ambulatory Visit

## 2024-03-08 VITALS — BP 125/86 | HR 87 | Temp 97.9°F | Resp 14 | Ht 67.5 in | Wt 240.8 lb

## 2024-03-08 DIAGNOSIS — Z79899 Other long term (current) drug therapy: Secondary | ICD-10-CM | POA: Diagnosis not present

## 2024-03-08 DIAGNOSIS — M79672 Pain in left foot: Secondary | ICD-10-CM | POA: Diagnosis not present

## 2024-03-08 DIAGNOSIS — Z8719 Personal history of other diseases of the digestive system: Secondary | ICD-10-CM | POA: Diagnosis not present

## 2024-03-08 DIAGNOSIS — M170 Bilateral primary osteoarthritis of knee: Secondary | ICD-10-CM

## 2024-03-08 DIAGNOSIS — M0579 Rheumatoid arthritis with rheumatoid factor of multiple sites without organ or systems involvement: Secondary | ICD-10-CM

## 2024-03-08 DIAGNOSIS — H2013 Chronic iridocyclitis, bilateral: Secondary | ICD-10-CM | POA: Diagnosis not present

## 2024-03-08 DIAGNOSIS — M79642 Pain in left hand: Secondary | ICD-10-CM

## 2024-03-08 DIAGNOSIS — M79641 Pain in right hand: Secondary | ICD-10-CM

## 2024-03-08 DIAGNOSIS — M533 Sacrococcygeal disorders, not elsewhere classified: Secondary | ICD-10-CM | POA: Diagnosis not present

## 2024-03-08 DIAGNOSIS — Q667 Congenital pes cavus, unspecified foot: Secondary | ICD-10-CM | POA: Diagnosis not present

## 2024-03-08 DIAGNOSIS — M51369 Other intervertebral disc degeneration, lumbar region without mention of lumbar back pain or lower extremity pain: Secondary | ICD-10-CM

## 2024-03-08 DIAGNOSIS — M79671 Pain in right foot: Secondary | ICD-10-CM | POA: Diagnosis not present

## 2024-03-08 DIAGNOSIS — M5030 Other cervical disc degeneration, unspecified cervical region: Secondary | ICD-10-CM

## 2024-03-08 DIAGNOSIS — G8929 Other chronic pain: Secondary | ICD-10-CM

## 2024-03-08 DIAGNOSIS — Z8269 Family history of other diseases of the musculoskeletal system and connective tissue: Secondary | ICD-10-CM | POA: Diagnosis not present

## 2024-03-08 DIAGNOSIS — Z85820 Personal history of malignant melanoma of skin: Secondary | ICD-10-CM

## 2024-03-08 NOTE — Patient Instructions (Signed)
 Standing Labs We placed an order today for your standing lab work.   Please have your standing labs drawn in March and every 3 months  Please have your labs drawn 2 weeks prior to your appointment so that the provider can discuss your lab results at your appointment, if possible.  Please note that you may see your imaging and lab results in MyChart before we have reviewed them. We will contact you once all results are reviewed. Please allow our office up to 72 hours to thoroughly review all of the results before contacting the office for clarification of your results.  WALK-IN LAB HOURS  Monday through Thursday from 8:00 am - 4:30 pm and Friday from 8:00 am-12:00 pm.  Patients with office visits requiring labs will be seen before walk-in labs.  You may encounter longer than normal wait times. Please allow additional time. Wait times may be shorter on  Monday and Thursday afternoons.  We do not book appointments for walk-in labs. We appreciate your patience and understanding with our staff.   Labs are drawn by [facility]. Please bring your co-pay at the time of your lab draw.  You may receive a bill from [facility] for your lab work.  Please note if you are on Hydroxychloroquine and and an order has been placed for a Hydroxychloroquine level,  you will need to have it drawn 4 hours or more after your last dose.  If you wish to have your labs drawn at another location, please call the office 24 hours in advance so we can fax the orders.  The office is located at [address], [address], [place], [place] [address]   If you have any questions regarding directions or hours of operation,  please call [phone].   As a reminder, please drink plenty of water prior to coming for your lab work. Thanks!   Vaccines You are taking a medication(s) that can suppress your immune system.  The following immunizations are recommended: Flu annually Covid-19  RSV Td/Tdap (tetanus, diphtheria, pertussis)  every 10 years Pneumonia (Prevnar 15 then Pneumovax 23 at least 1 year apart.  Alternatively, can take Prevnar 20 without needing additional dose) Shingrix: 2 doses from 4 weeks to 6 months apart  Please check with your PCP to make sure you are up to date.   If you have signs or symptoms of an infection or start antibiotics: First, call your PCP for workup of your infection. Hold your medication through the infection, until you complete your antibiotics, and until symptoms resolve if you take the following: Injectable medication (Actemra, Benlysta, Cimzia, Cosentyx, Enbrel, Humira, Kevzara, Orencia, Remicade, Simponi, Stelara, Taltz, Tremfya) Methotrexate Leflunomide (Arava) Mycophenolate (Cellcept) Earma, Olumiant, or Rinvoq

## 2024-03-09 ENCOUNTER — Ambulatory Visit: Payer: Self-pay | Admitting: Rheumatology

## 2024-03-09 LAB — URIC ACID: Uric Acid, Serum: 5.2 mg/dL (ref 2.5–7.0)

## 2024-03-09 NOTE — Progress Notes (Signed)
 Uric acid is normal

## 2024-04-02 ENCOUNTER — Telehealth (INDEPENDENT_AMBULATORY_CARE_PROVIDER_SITE_OTHER): Admitting: Internal Medicine

## 2024-04-02 ENCOUNTER — Other Ambulatory Visit: Payer: Self-pay

## 2024-04-02 ENCOUNTER — Encounter (INDEPENDENT_AMBULATORY_CARE_PROVIDER_SITE_OTHER): Payer: Self-pay | Admitting: Internal Medicine

## 2024-04-02 ENCOUNTER — Other Ambulatory Visit (HOSPITAL_BASED_OUTPATIENT_CLINIC_OR_DEPARTMENT_OTHER): Payer: Self-pay

## 2024-04-02 VITALS — Ht 67.0 in | Wt 233.0 lb

## 2024-04-02 DIAGNOSIS — Z6837 Body mass index (BMI) 37.0-37.9, adult: Secondary | ICD-10-CM | POA: Diagnosis not present

## 2024-04-02 DIAGNOSIS — K760 Fatty (change of) liver, not elsewhere classified: Secondary | ICD-10-CM

## 2024-04-02 DIAGNOSIS — E88819 Insulin resistance, unspecified: Secondary | ICD-10-CM | POA: Diagnosis not present

## 2024-04-02 DIAGNOSIS — E66812 Obesity, class 2: Secondary | ICD-10-CM

## 2024-04-02 MED ORDER — METFORMIN HCL ER 500 MG PO TB24
500.0000 mg | ORAL_TABLET | Freq: Two times a day (BID) | ORAL | 0 refills | Status: DC
  Filled 2024-04-02: qty 60, 30d supply, fill #0

## 2024-04-02 MED ORDER — METFORMIN HCL ER 500 MG PO TB24
500.0000 mg | ORAL_TABLET | Freq: Two times a day (BID) | ORAL | 1 refills | Status: AC
  Filled 2024-04-02: qty 60, 30d supply, fill #0

## 2024-04-02 NOTE — Progress Notes (Signed)
 Appointment Time: 12:20  Visit     Do you feel as if you have GAINED weight?   Plan 1300 calories and 100 grams of protein   Percent Following  25%   Are you exercising?:  NO    What type of exercise    Days              Minutes     Do you check your BP at home?  NO   Medication Refills   NO   Pharmacy       Are you drinking enough water?    YES    Skipping Meals?  NO  Getting Recommended amount of protein?   YES     Will anyone else be on the call with you?     NO        Sleeping between 7-9 hours per night?        NO   NAME OF PERSON IF APPLICABLE:

## 2024-05-07 ENCOUNTER — Ambulatory Visit (INDEPENDENT_AMBULATORY_CARE_PROVIDER_SITE_OTHER): Admitting: Internal Medicine

## 2024-08-07 ENCOUNTER — Ambulatory Visit: Admitting: Rheumatology
